# Patient Record
Sex: Male | Born: 1985 | Race: White | Hispanic: Yes | Marital: Married | Smoking: Current some day smoker
Health system: Southern US, Community
[De-identification: ages and names within clinical notes are randomized; demographics above are authoritative.]

## PROBLEM LIST (undated history)

## (undated) DIAGNOSIS — IMO0001 Reserved for inherently not codable concepts without codable children: Secondary | ICD-10-CM

## (undated) DIAGNOSIS — G510 Bell's palsy: Secondary | ICD-10-CM

## (undated) DIAGNOSIS — K219 Gastro-esophageal reflux disease without esophagitis: Secondary | ICD-10-CM

## (undated) DIAGNOSIS — I10 Essential (primary) hypertension: Secondary | ICD-10-CM

---
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
## 2004-05-19 ENCOUNTER — Emergency Department (HOSPITAL_COMMUNITY): Admission: EM | Admit: 2004-05-19 | Discharge: 2004-05-19 | Payer: Self-pay | Admitting: Emergency Medicine

## 2004-11-25 ENCOUNTER — Emergency Department (HOSPITAL_COMMUNITY): Admission: EM | Admit: 2004-11-25 | Discharge: 2004-11-26 | Payer: Self-pay | Admitting: Emergency Medicine

## 2005-09-06 ENCOUNTER — Emergency Department (HOSPITAL_COMMUNITY): Admission: EM | Admit: 2005-09-06 | Discharge: 2005-09-07 | Payer: Self-pay | Admitting: Emergency Medicine

## 2006-09-06 ENCOUNTER — Emergency Department (HOSPITAL_COMMUNITY): Admission: EM | Admit: 2006-09-06 | Discharge: 2006-09-06 | Payer: Self-pay | Admitting: Emergency Medicine

## 2008-02-19 HISTORY — PX: WRIST SURGERY: SHX841

## 2009-07-14 ENCOUNTER — Emergency Department (HOSPITAL_COMMUNITY): Admission: EM | Admit: 2009-07-14 | Discharge: 2009-07-14 | Payer: Self-pay | Admitting: Emergency Medicine

## 2009-08-29 ENCOUNTER — Ambulatory Visit (HOSPITAL_COMMUNITY): Admission: RE | Admit: 2009-08-29 | Discharge: 2009-08-29 | Payer: Self-pay | Admitting: Plastic Surgery

## 2010-06-12 ENCOUNTER — Inpatient Hospital Stay (INDEPENDENT_AMBULATORY_CARE_PROVIDER_SITE_OTHER)
Admission: RE | Admit: 2010-06-12 | Discharge: 2010-06-12 | Disposition: A | Discharge status: Home / Self Care | Payer: Medicaid Other | Source: Ambulatory Visit | Attending: Emergency Medicine | Admitting: Emergency Medicine

## 2010-06-12 DIAGNOSIS — L03039 Cellulitis of unspecified toe: Secondary | ICD-10-CM

## 2010-06-12 DIAGNOSIS — L600 Ingrowing nail: Secondary | ICD-10-CM

## 2010-06-12 DIAGNOSIS — L255 Unspecified contact dermatitis due to plants, except food: Secondary | ICD-10-CM

## 2010-12-03 LAB — I-STAT 8, (EC8 V) (CONVERTED LAB)
Acid-base deficit: 1
BUN: 9
Bicarbonate: 23.8
Chloride: 108
Glucose, Bld: 88
HCT: 48
Hemoglobin: 16.3
Operator id: 161631
Potassium: 3.6
Sodium: 141
TCO2: 25
pCO2, Ven: 39.4 — ABNORMAL LOW
pH, Ven: 7.388 — ABNORMAL HIGH

## 2010-12-03 LAB — POCT I-STAT CREATININE
Creatinine, Ser: 1
Operator id: 161631

## 2011-10-24 ENCOUNTER — Encounter (HOSPITAL_COMMUNITY): Payer: Self-pay | Admitting: Emergency Medicine

## 2011-10-24 ENCOUNTER — Emergency Department (HOSPITAL_COMMUNITY): Payer: BC Managed Care – PPO

## 2011-10-24 DIAGNOSIS — M25469 Effusion, unspecified knee: Secondary | ICD-10-CM | POA: Insufficient documentation

## 2011-10-24 DIAGNOSIS — K219 Gastro-esophageal reflux disease without esophagitis: Secondary | ICD-10-CM | POA: Insufficient documentation

## 2011-10-24 DIAGNOSIS — M25569 Pain in unspecified knee: Secondary | ICD-10-CM | POA: Insufficient documentation

## 2011-10-24 NOTE — ED Notes (Signed)
R knee has been bothering him for 4 days, possible hurt from basketball and working out; CMS intact

## 2011-10-25 ENCOUNTER — Emergency Department (HOSPITAL_COMMUNITY)
Admission: EM | Admit: 2011-10-25 | Discharge: 2011-10-25 | Disposition: A | Discharge status: Home / Self Care | Payer: BC Managed Care – PPO | Attending: Emergency Medicine | Admitting: Emergency Medicine

## 2011-10-25 DIAGNOSIS — M25469 Effusion, unspecified knee: Secondary | ICD-10-CM

## 2011-10-25 HISTORY — DX: Reserved for inherently not codable concepts without codable children: IMO0001

## 2011-10-25 HISTORY — DX: Gastro-esophageal reflux disease without esophagitis: K21.9

## 2011-10-25 MED ORDER — OXYCODONE-ACETAMINOPHEN 5-325 MG PO TABS: 1.0000 | ORAL_TABLET | ORAL | Status: AC | PRN

## 2011-10-25 MED ORDER — IBUPROFEN 400 MG PO TABS
800.0000 mg | ORAL_TABLET | Freq: Once | ORAL | Status: AC
  Administered 2011-10-25: 800 mg via ORAL
  Filled 2011-10-25: qty 2

## 2011-10-25 NOTE — ED Notes (Signed)
Pt reports (R) lateral and anterior knee pain x5 days, pt reports playing basketball Sunday night, pain started shortly after, denies any known injury

## 2011-10-25 NOTE — ED Provider Notes (Signed)
History     CSN: [id number]  Arrival date & time 10/24/11  2132   First MD Initiated Contact with Patient 10/25/11 0107      Chief Complaint  Patient presents with  . Knee Pain    (Consider location/radiation/quality/duration/timing/severity/associated sxs/prior treatment) Patient is a 26 y.o. male presenting with knee pain. The history is provided by the patient. No language interpreter was used.  Knee Pain The current episode started in the past 7 days. The problem occurs constantly. The problem has been gradually worsening. Pertinent negatives include no chills, fever, nausea, numbness, vomiting or weakness. The symptoms are aggravated by walking. He has tried NSAIDs for the symptoms.  R knee pain x 5 days with + effusion.  Recurrent.   Past Medical History  Diagnosis Date  . Reflux     Past Surgical History  Procedure Date  . Wrist surgery   . Finger surgery     History reviewed. No pertinent family history.  History  Substance Use Topics  . Smoking status: Never Smoker   . Smokeless tobacco: Not on file  . Alcohol Use: No      Review of Systems  Constitutional: Negative.  Negative for fever and chills.  HENT: Negative.   Eyes: Negative.   Respiratory: Negative.   Cardiovascular: Negative.   Gastrointestinal: Negative.  Negative for nausea and vomiting.  Musculoskeletal:       R knee  Neurological: Negative.  Negative for weakness and numbness.  Psychiatric/Behavioral: Negative.   All other systems reviewed and are negative.    Allergies  Review of patient's allergies indicates no known allergies.  Home Medications   Current Outpatient Rx  Name Route Sig Dispense Refill  . OMEPRAZOLE 20 MG PO CPDR Oral Take 40 mg by mouth daily.      BP 134/87  Pulse 73  Temp 98.3 F (36.8 C) (Oral)  Resp 22  SpO2 100%  Physical Exam  Nursing note and vitals reviewed. Constitutional: He is oriented to person, place, and time. He appears well-developed and  well-nourished.  HENT:  Head: Normocephalic.  Eyes: Conjunctivae and EOM are normal. Pupils are equal, round, and reactive to light.  Neck: Normal range of motion. Neck supple.  Cardiovascular: Normal rate.   Pulmonary/Chest: Effort normal.  Abdominal: Soft.  Musculoskeletal: Normal range of motion.       R knee tenderness with external in internal rotation and palpatation  Neurological: He is alert and oriented to person, place, and time.  Skin: Skin is warm and dry.  Psychiatric: He has a normal mood and affect.    ED Course  Procedures (including critical care time)  Labs Reviewed - No data to display Dg Knee Complete 4 Views Right  10/24/2011  *RADIOLOGY REPORT*  Clinical Data: Knee pain for 3 days with slight swelling.  No known injury.  RIGHT KNEE - COMPLETE 4+ VIEW  Comparison: None.  Findings: There is spurring of the superior aspect of the medial femoral epicondyle which may be secondary to an old injury.  No acute fracture, dislocation or significant joint space loss is seen.  There is evidence of a moderate sized knee joint effusion on the lateral view.  IMPRESSION: Nonspecific knee joint effusion without acute osseous findings.   Original Report Authenticated By: Gerrianne Scale, M.D.      No diagnosis found.    MDM  R knee pain x 4-5 days with effusion.  Ice elevate ibuprofen, knee sleeve and prescription for Percocet. Patient will  followup with orthopedic if not better in 5-7 days. Return to the ER for severe pain or any other concerns. X-ray was reviewed by myself.         Remi Haggard, NP 10/25/11 [phone]

## 2011-10-25 NOTE — ED Provider Notes (Signed)
Medical screening examination/treatment/procedure(s) were performed by non-physician practitioner and as supervising physician I was immediately available for consultation/collaboration.  Tena Linebaugh, MD 10/25/11 0604 

## 2011-10-25 NOTE — ED Notes (Signed)
Family at bedside. 

## 2012-01-19 ENCOUNTER — Emergency Department (HOSPITAL_COMMUNITY): Payer: BC Managed Care – PPO

## 2012-01-19 ENCOUNTER — Encounter (HOSPITAL_COMMUNITY): Payer: Self-pay | Admitting: *Deleted

## 2012-01-19 ENCOUNTER — Emergency Department (HOSPITAL_COMMUNITY)
Admission: EM | Admit: 2012-01-19 | Discharge: 2012-01-19 | Disposition: A | Discharge status: Home / Self Care | Payer: BC Managed Care – PPO | Attending: Emergency Medicine | Admitting: Emergency Medicine

## 2012-01-19 DIAGNOSIS — K219 Gastro-esophageal reflux disease without esophagitis: Secondary | ICD-10-CM | POA: Insufficient documentation

## 2012-01-19 DIAGNOSIS — S91309A Unspecified open wound, unspecified foot, initial encounter: Secondary | ICD-10-CM | POA: Insufficient documentation

## 2012-01-19 DIAGNOSIS — Y9239 Other specified sports and athletic area as the place of occurrence of the external cause: Secondary | ICD-10-CM | POA: Insufficient documentation

## 2012-01-19 DIAGNOSIS — Z79899 Other long term (current) drug therapy: Secondary | ICD-10-CM | POA: Insufficient documentation

## 2012-01-19 DIAGNOSIS — W268XXA Contact with other sharp object(s), not elsewhere classified, initial encounter: Secondary | ICD-10-CM | POA: Insufficient documentation

## 2012-01-19 DIAGNOSIS — Y9366 Activity, soccer: Secondary | ICD-10-CM | POA: Insufficient documentation

## 2012-01-19 DIAGNOSIS — T148XXA Other injury of unspecified body region, initial encounter: Secondary | ICD-10-CM

## 2012-01-19 MED ORDER — HYDROCODONE-ACETAMINOPHEN 5-325 MG PO TABS: 1.0000 | ORAL_TABLET | Freq: Four times a day (QID) | ORAL | Status: DC | PRN

## 2012-01-19 MED ORDER — CIPROFLOXACIN HCL 750 MG PO TABS: 750.0000 mg | ORAL_TABLET | Freq: Two times a day (BID) | ORAL | Status: DC

## 2012-01-19 MED ORDER — TETANUS-DIPHTH-ACELL PERTUSSIS 5-2.5-18.5 LF-MCG/0.5 IM SUSP: 0.5000 mL | Freq: Once | INTRAMUSCULAR | Status: DC

## 2012-01-19 MED ORDER — OXYCODONE-ACETAMINOPHEN 5-325 MG PO TABS
2.0000 | ORAL_TABLET | Freq: Once | ORAL | Status: AC
  Administered 2012-01-19: 2 via ORAL
  Filled 2012-01-19: qty 2

## 2012-01-19 NOTE — ED Provider Notes (Addendum)
Did not see any evidence of foreign body on ultrasound examination of foot in orthogonal planes. Medical screening examination/treatment/procedure(s) were performed by non-physician practitioner and as supervising physician I was immediately available for consultation/collaboration.  Jones Skene, M.D.     Jones Skene, MD 01/19/12 2341

## 2012-01-19 NOTE — Progress Notes (Signed)
Orthopedic Tech Progress Note Patient Details:  Richard Ramirez 02/02/1986 [id number]  Ortho Devices Type of Ortho Device: Crutches Ortho Device/Splint Interventions: Application;Ordered   Jennye Moccasin 01/19/2012, 8:48 PM

## 2012-01-19 NOTE — ED Provider Notes (Signed)
History     CSN: [id number]  Arrival date & time 01/19/12  1726   First MD Initiated Contact with Patient 01/19/12 1824      Chief Complaint  Patient presents with  . Foot Injury    (Consider location/radiation/quality/duration/timing/severity/associated sxs/prior treatment) HPI Comments: 26 year old male presents emergency department complaining of left foot pain.  Onset of symptoms occurred just prior to arrival after an injury while playing soccer.  Patient states that he was wearing socks when he stepped on a piece of wood that penetrated the plantar surface of his left foot.  He had immediate pain and bleeding.  Patient states that pain is worsened by palpation or weightbearing.  No other complaints at this time.  The history is provided by the patient.    Past Medical History  Diagnosis Date  . Reflux     Past Surgical History  Procedure Date  . Wrist surgery   . Finger surgery     History reviewed. No pertinent family history.  History  Substance Use Topics  . Smoking status: Never Smoker   . Smokeless tobacco: Not on file  . Alcohol Use: No      Review of Systems  Constitutional: Negative for fever, diaphoresis and activity change.  HENT: Negative for congestion and neck pain.   Respiratory: Negative for cough.   Genitourinary: Negative for dysuria.  Musculoskeletal: Negative for myalgias.  Skin: Positive for wound. Negative for color change.  Neurological: Negative for headaches.  All other systems reviewed and are negative.    Allergies  Review of patient's allergies indicates no known allergies.  Home Medications   Current Outpatient Rx  Name  Route  Sig  Dispense  Refill  . OMEPRAZOLE 20 MG PO CPDR   Oral   Take 40 mg by mouth daily.           BP 137/90  Pulse 112  Temp 98.2 F (36.8 C) (Oral)  Resp 20  SpO2 97%  Physical Exam  Nursing note and vitals reviewed. Constitutional: He is oriented to person, place, and time. He  appears well-developed and well-nourished. No distress.  HENT:  Head: Normocephalic and atraumatic.  Eyes: Conjunctivae normal and EOM are normal.  Neck: Normal range of motion.  Pulmonary/Chest: Effort normal.  Musculoskeletal: Normal range of motion.       ttp along plantar surface at heal.   Neurological: He is alert and oriented to person, place, and time.  Skin: Skin is warm and dry. No rash noted. He is not diaphoretic.       1-2 cm flap located on the plantar surface of left foot at the heel. No evidence of palpable foreign body.  Bleeding contained. No swelling warmth or erythema seen   Psychiatric: He has a normal mood and affect. His behavior is normal.    ED Course  Procedures (including critical care time)  Labs Reviewed - No data to display Dg Foot Complete Left  01/19/2012  *RADIOLOGY REPORT*  Clinical Data: Laceration on the left heel.  LEFT FOOT - COMPLETE 3+ VIEW  Comparison: None.  Findings: No acute osseous abnormality.  Slight dorsal spurring of the distal talus.  Irregularity in the soft tissue of the plantar aspect of foot may be related to a bandage.  IMPRESSION: No acute osseous abnormality.  Soft tissue densities are probably related to a bandage.   Original Report Authenticated By: Francene Boyers, M.D.      No diagnosis found.  FAST BEDSIDE US Indication:  Searching for radio lucent FB ie.: wood splinter Study performed with Dr. Rulon Abide at bed side. No evidence of FB, view consisted of 3 cm depth. Pt tolerated exam well.  Archived electronically I personally performed and interrepreted the images   MDM  Puncture wound  Pt presents to ER c/o puncture wound. No evidence of FB on Xray or Bedside US. No evidence of bone penetration, wound to be treated with local care. Advised pt that limb should be non-wt bearing, elevated & pt followed very closely for signs of infection. Strict return precautions were discussed. Pseudomonas coverage abx given (cipro 750 PO BID x  7 days).          Jaci Carrel, [place] 01/19/12 2038

## 2012-01-19 NOTE — ED Notes (Signed)
Pt reports playing soccer and cut bottom of left foot on piece of wood, abrasion noted, no bleeding noted.

## 2012-09-03 ENCOUNTER — Encounter (HOSPITAL_COMMUNITY): Payer: Self-pay | Admitting: *Deleted

## 2012-09-03 ENCOUNTER — Emergency Department (HOSPITAL_COMMUNITY)
Admission: EM | Admit: 2012-09-03 | Discharge: 2012-09-03 | Disposition: A | Discharge status: Home / Self Care | Payer: BC Managed Care – PPO | Source: Home / Self Care | Attending: Family Medicine | Admitting: Family Medicine

## 2012-09-03 DIAGNOSIS — T6391XA Toxic effect of contact with unspecified venomous animal, accidental (unintentional), initial encounter: Secondary | ICD-10-CM

## 2012-09-03 DIAGNOSIS — T63461A Toxic effect of venom of wasps, accidental (unintentional), initial encounter: Secondary | ICD-10-CM

## 2012-09-03 MED ORDER — HYDROCODONE-ACETAMINOPHEN 5-325 MG PO TABS
ORAL_TABLET | ORAL | Status: AC
  Filled 2012-09-03: qty 1

## 2012-09-03 MED ORDER — METHYLPREDNISOLONE 4 MG PO KIT: PACK | ORAL | Status: DC

## 2012-09-03 MED ORDER — TRIAMCINOLONE ACETONIDE 40 MG/ML IJ SUSP
INTRAMUSCULAR | Status: AC
  Filled 2012-09-03: qty 2

## 2012-09-03 MED ORDER — HYDROCODONE-ACETAMINOPHEN 5-325 MG PO TABS
1.0000 | ORAL_TABLET | Freq: Once | ORAL | Status: AC
  Administered 2012-09-03: 1 via ORAL

## 2012-09-03 MED ORDER — DIPHENHYDRAMINE HCL 50 MG/ML IJ SOLN
INTRAMUSCULAR | Status: AC
  Filled 2012-09-03: qty 1

## 2012-09-03 MED ORDER — TRIAMCINOLONE ACETONIDE 40 MG/ML IJ SUSP
60.0000 mg | Freq: Once | INTRAMUSCULAR | Status: AC
  Administered 2012-09-03: 60 mg via INTRAMUSCULAR

## 2012-09-03 MED ORDER — DIPHENHYDRAMINE HCL 50 MG/ML IJ SOLN
50.0000 mg | Freq: Once | INTRAMUSCULAR | Status: AC
  Administered 2012-09-03: 50 mg via INTRAMUSCULAR

## 2012-09-03 NOTE — ED Notes (Signed)
Stung by a wasp on R small finger @ 1600.  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  C/o nausea, headache and  pain up to R lat. Wrist.  C/o some numbness to the finger.  Dorsum of R hand appears swollen.  Wife thinks his speech is still slurred.  Pt. states he feels " a little SOB."  C/o feeling hot.  No acute respiratory distress noted.   Pt. speaking in complete sentences. No rash noted.

## 2012-09-03 NOTE — ED Provider Notes (Signed)
History    CSN: [id number] Arrival date & time 09/03/12  1614  None    Chief Complaint  Patient presents with  . Insect Bite   (Consider location/radiation/quality/duration/timing/severity/associated sxs/prior Treatment) HPI Comments: 27 year old male received a bee sting to the right fifth digit. This occurred approximately 45 minutes before he arrived. There is mild swelling to the fifth digit and the ulnar aspect of the hand. Edema does not extend beyond the wrist. He states pain does extend toward the elbow. He rates the pain at the site of the sting as an 8/10. Is having mild nausea but no vomiting. Denies shortness of breath.  Past Medical History  Diagnosis Date  . Reflux    Past Surgical History  Procedure Laterality Date  . Wrist surgery Left 2010  . Finger surgery Right 2002    R small finger   Family History  Problem Relation Age of Onset  . Hyperlipidemia Mother    History  Substance Use Topics  . Smoking status: Never Smoker   . Smokeless tobacco: Not on file  . Alcohol Use: Yes     Comment: occasional    Review of Systems  Constitutional: Positive for activity change. Negative for fever.  HENT: Negative.   Eyes: Negative.   Respiratory: Negative.   Gastrointestinal: Positive for nausea. Negative for vomiting.  Musculoskeletal: Negative.   Neurological: Negative.     Allergies  Review of patient's allergies indicates no known allergies.  Home Medications   Current Outpatient Rx  Name  Route  Sig  Dispense  Refill  . ibuprofen (ADVIL,MOTRIN) 800 MG tablet   Oral   Take 800 mg by mouth every 8 (eight) hours as needed for pain.         Marland Kitchen omeprazole (PRILOSEC) 20 MG capsule   Oral   Take 40 mg by mouth 2 (two) times daily.          . ciprofloxacin (CIPRO) 750 MG tablet   Oral   Take 1 tablet (750 mg total) by mouth 2 (two) times daily.   14 tablet   0   . HYDROcodone-acetaminophen (NORCO/VICODIN) 5-325 MG per tablet   Oral   Take 1  tablet by mouth every 6 (six) hours as needed for pain.   15 tablet   0   . methylPREDNISolone (MEDROL DOSEPAK) 4 MG tablet      follow package directions. Take with food.   21 tablet   0    BP 126/82  Pulse 110  Temp(Src) 98.2 F (36.8 C) (Oral)  Resp 22  SpO2 98% Physical Exam  Nursing note and vitals reviewed. Constitutional: He is oriented to person, place, and time. He appears well-developed and well-nourished. No distress.  Appears to be in no distress. No respiratory distress or tachypnea. No generalized swelling.  HENT:  Mouth/Throat: Oropharynx is clear and moist. No oropharyngeal exudate.  Eyes: Conjunctivae and EOM are normal.  Neck: Normal range of motion. Neck supple.  Cardiovascular: Normal rate, regular rhythm and normal heart sounds.   Pulmonary/Chest: Effort normal and breath sounds normal. No respiratory distress. He has no wheezes.  Musculoskeletal: Normal range of motion.  Neurological: He is alert and oriented to person, place, and time.  Skin: Skin is warm and dry.  Minor erythema to a small area of the proximal phalanx of the right fifth digit with a bee sting occurred. Mild edema to the fifth digit and ulnar aspect of the right hand.  Psychiatric: He has a  normal mood and affect.    ED Course  Procedures (including critical care time) Labs Reviewed - No data to display No results found. 1. Bee sting reaction, initial encounter     MDM  Pt observed until 1815h He has improved with reduction in pain and has not developed new symptoms. St feel better. D/C in stable and improved condition. Medrol dosepack Benadryl 50mg  q 4h for the next 24h.  For any new problems seek medical attention promptly, especially for orla swelling, trouble breating.  Hayden Rasmussen, NP 09/03/12 [phone]

## 2012-09-05 NOTE — ED Provider Notes (Signed)
Medical screening examination/treatment/procedure(s) were performed by resident physician or non-physician practitioner and as supervising physician I was immediately available for consultation/collaboration.   KINDL,JAMES DOUGLAS MD.   James D Kindl, MD 09/05/12 0931 

## 2012-09-12 ENCOUNTER — Emergency Department (HOSPITAL_COMMUNITY)
Admission: EM | Admit: 2012-09-12 | Discharge: 2012-09-12 | Disposition: A | Discharge status: Home / Self Care | Payer: BC Managed Care – PPO | Source: Home / Self Care

## 2012-09-12 ENCOUNTER — Encounter (HOSPITAL_COMMUNITY): Payer: Self-pay | Admitting: Emergency Medicine

## 2012-09-12 DIAGNOSIS — S41112A Laceration without foreign body of left upper arm, initial encounter: Secondary | ICD-10-CM

## 2012-09-12 DIAGNOSIS — S41109A Unspecified open wound of unspecified upper arm, initial encounter: Secondary | ICD-10-CM

## 2012-09-12 NOTE — ED Provider Notes (Signed)
Richard Ramirez is a 27 y.o. male who presents to Urgent Care today for left upper extremity laceration. Patient was driving a lawnmower today when he was struck by a sharp tree branch in his upper inner arm on the left side. This resulted in a small laceration. He control bleeding with pressure and presents today to clinic for evaluation and management. He denies any significant pain radiating pain weakness numbness fevers or chills. His last tetanus shot was in 2010.    PMH reviewed. Healthy otherwise History  Substance Use Topics  . Smoking status: Never Smoker   . Smokeless tobacco: Not on file  . Alcohol Use: Yes     Comment: occasional   ROS as above Medications reviewed. No current facility-administered medications for this encounter.   No current outpatient prescriptions on file.    Exam:  BP 146/78  Pulse 91  Temp(Src) 98.3 F (36.8 C) (Oral)  Resp 19  SpO2 97% Gen: Well NAD Skin: Left upper inner arm small one and a half centimeter linear laceration involving the dermis not extending through the subcutaneous tissue. No significant bleeding.    Procedure note: Consent obtained and timeout performed. The wound is copiously irrigated with sterile saline. One and a half milliliters of 2% lidocaine with epinephrine were injected around the wound achieving good anesthesia. Sterile dressing was then applied and 3 simple interrupted sutures using 4-0 Prolene were used to close the wound. Patient tolerated procedure well.   No results found for this or any previous visit (from the past 24 hour(s)). No results found.  Assessment and Plan: 27 y.o. male with laceration repaired with sutures,  Tetanus up-to-date.  Followup in one week for suture removal.  Laceration care instructions provided in a handout.  Discussed warning signs or symptoms. Please see discharge instructions. Patient expresses understanding.      Rodolph Bong, MD 09/12/12 [phone]

## 2012-09-12 NOTE — ED Notes (Signed)
Pt c/o laceration to right arm/bicep onset today... Reports he was cutting his yard when a tree branch hit his arm... Last tetanus was about 6 years... He is alert w/no signs of acute distress.

## 2013-02-14 ENCOUNTER — Emergency Department (HOSPITAL_COMMUNITY)
Admission: EM | Admit: 2013-02-14 | Discharge: 2013-02-14 | Disposition: A | Discharge status: Home / Self Care | Payer: BC Managed Care – PPO | Attending: Emergency Medicine | Admitting: Emergency Medicine

## 2013-02-14 ENCOUNTER — Encounter (HOSPITAL_COMMUNITY): Payer: Self-pay | Admitting: Emergency Medicine

## 2013-02-14 ENCOUNTER — Emergency Department (HOSPITAL_COMMUNITY): Payer: BC Managed Care – PPO

## 2013-02-14 DIAGNOSIS — J029 Acute pharyngitis, unspecified: Secondary | ICD-10-CM

## 2013-02-14 DIAGNOSIS — Z8719 Personal history of other diseases of the digestive system: Secondary | ICD-10-CM | POA: Insufficient documentation

## 2013-02-14 DIAGNOSIS — H9209 Otalgia, unspecified ear: Secondary | ICD-10-CM | POA: Insufficient documentation

## 2013-02-14 DIAGNOSIS — M542 Cervicalgia: Secondary | ICD-10-CM | POA: Insufficient documentation

## 2013-02-14 LAB — RAPID STREP SCREEN (MED CTR MEBANE ONLY): Streptococcus, Group A Screen (Direct): NEGATIVE

## 2013-02-14 MED ORDER — HYDROCODONE-HOMATROPINE 5-1.5 MG/5ML PO SYRP
5.0000 mL | ORAL_SOLUTION | Freq: Once | ORAL | Status: AC
  Administered 2013-02-14: 5 mL via ORAL
  Filled 2013-02-14: qty 5

## 2013-02-14 MED ORDER — HYDROCODONE-HOMATROPINE 5-1.5 MG/5ML PO SYRP: 5.0000 mL | ORAL_SOLUTION | Freq: Four times a day (QID) | ORAL | Status: DC | PRN

## 2013-02-14 NOTE — ED Notes (Signed)
Pt c/o sore throat x 1 week with some pain and swelling

## 2013-02-14 NOTE — ED Provider Notes (Signed)
CSN: [id number]     Arrival date & time 02/14/13  1718 History  This chart was scribed for Fayrene Helper, PA-C, working with Junius Argyle, MD by Blanchard Kelch, ED Scribe. This patient was seen in room TR04C/TR04C and the patient's care was started at 7:33 PM.    Chief Complaint  Patient presents with  . Sore Throat    Patient is a 27 y.o. male presenting with pharyngitis. The history is provided by the patient. No language interpreter was used.  Sore Throat Pertinent negatives include no chest pain and no shortness of breath.    HPI Comments: Richard Ramirez is a 27 y.o. male who presents to the Emergency Department complaining of constant, worsening left sided sore throat that began about a week ago. He was using Alka Seltzer Plus for the sore throat with temporary relief. However, he states that yesterday and today the pain worsened. The pain now radiates throughout the left side of his neck and up to his ear. He describes the pain as stabbing and reports it is a 9/10 in severity. The pain is worsened by swallowing and talking. He denies rhinorrhea, sneezing cough, chest pain, shortness of breath. He denies a history of recurrent strep throat infections. He denies any sick contacts.     Past Medical History  Diagnosis Date  . Reflux    Past Surgical History  Procedure Laterality Date  . Wrist surgery Left 2010  . Finger surgery Right 2002    R small finger   Family History  Problem Relation Age of Onset  . Hyperlipidemia Mother    History  Substance Use Topics  . Smoking status: Never Smoker   . Smokeless tobacco: Not on file  . Alcohol Use: Yes     Comment: occasional    Review of Systems  HENT: Positive for ear pain and sore throat. Negative for rhinorrhea and sneezing.   Respiratory: Negative for cough and shortness of breath.   Cardiovascular: Negative for chest pain.  Musculoskeletal: Positive for neck pain.    Allergies  Review of patient's allergies  indicates no known allergies.  Home Medications   Current Outpatient Rx  Name  Route  Sig  Dispense  Refill  . Aspirin Effervescent (ALKA-SELTZER PO)   Oral   Take 2 tablets by mouth daily.          Triage Vitals: BP 143/98  Pulse 114  Temp(Src) 97.9 F (36.6 C) (Oral)  Resp 20  Ht 6\' 3"  (1.905 m)  Wt 348 lb (157.852 kg)  BMI 43.50 kg/m2  SpO2 96%  Physical Exam  Nursing note and vitals reviewed. Constitutional: He is oriented to person, place, and time. He appears well-developed and well-nourished. No distress.  HENT:  Head: Normocephalic and atraumatic.  Right Ear: Tympanic membrane, external ear and ear canal normal.  Left Ear: Tympanic membrane normal.  Bilateral tonsillar enlargement and mild exudates noted. No peritonsillar abscess. No ludwig's angina. Left ear canal mildly erythematous. Left TM normal.   Eyes: EOM are normal.  Neck: Neck supple. No tracheal deviation present.  Cardiovascular: Normal rate, regular rhythm and normal heart sounds.  Exam reveals no gallop and no friction rub.   No murmur heard. Pulmonary/Chest: Effort normal. No respiratory distress. He has no wheezes. He has no rales.  Musculoskeletal: Normal range of motion.  Lymphadenopathy:    He has cervical adenopathy.  Neurological: He is alert and oriented to person, place, and time.  Skin: Skin is warm and dry.  Psychiatric: He has a normal mood and affect. His behavior is normal.    ED Course  Procedures (including critical care time)  DIAGNOSTIC STUDIES: Oxygen Saturation is 96% on room air, adequate by my interpretation.    COORDINATION OF CARE: 7:37 PM -Rapid strep test came back negative. Will order x-ray of neck soft tissue. Clinical suspicion of viral infection. Will order medication for pain. Recommend patient follows up with ENT if symptoms worsen or persist. Soft tissue xray without evidence suggestive of epiglottitis.  Pt able to swallow, has normal voice, no trismus.  Patient  verbalizes understanding and agrees with treatment plan.    Labs Review Labs Reviewed  RAPID STREP SCREEN  CULTURE, GROUP A STREP  RAPID STREP SCREEN   Imaging Review Dg Neck Soft Tissue  02/14/2013   CLINICAL DATA:  Sore throat. Difficulty swallowing. One week history of productive cough.  EXAM: NECK SOFT TISSUES - 1+ VIEW  COMPARISON:  None.  FINDINGS: Normal prevertebral soft tissues. Normal epiglottis. Normal fair angio all and upper tracheal airway. Visualized cervical spine unremarkable.  IMPRESSION: Normal examination.   Electronically Signed   By: Hulan Saas M.D.   On: 02/14/2013 20:43    EKG Interpretation   None       MDM   1. Pharyngitis    BP 143/98  Pulse 114  Temp(Src) 97.9 F (36.6 C) (Oral)  Resp 20  Ht 6\' 3"  (1.905 m)  Wt 348 lb (157.852 kg)  BMI 43.50 kg/m2  SpO2 96%   I personally performed the services described in this documentation, which was scribed in my presence. The recorded information has been reviewed and is accurate.     Fayrene Helper, PA-C 02/14/13 2139

## 2013-02-14 NOTE — ED Notes (Signed)
Pt alert and oriented, with steady gait at time of discharge. Pt given discharge papers and papers explained. All questions answered and pt walked to discharge.  

## 2013-02-15 NOTE — ED Provider Notes (Signed)
Medical screening examination/treatment/procedure(s) were performed by non-physician practitioner and as supervising physician I was immediately available for consultation/collaboration.  EKG Interpretation   None         Doniven Vanpatten S Dayden Viverette, MD 02/15/13 1215 

## 2013-02-17 LAB — CULTURE, GROUP A STREP

## 2013-03-11 ENCOUNTER — Emergency Department (HOSPITAL_COMMUNITY): Payer: BC Managed Care – PPO

## 2013-03-11 ENCOUNTER — Emergency Department (HOSPITAL_COMMUNITY)
Admission: EM | Admit: 2013-03-11 | Discharge: 2013-03-11 | Disposition: A | Discharge status: Home / Self Care | Payer: BC Managed Care – PPO | Attending: Emergency Medicine | Admitting: Emergency Medicine

## 2013-03-11 ENCOUNTER — Encounter (HOSPITAL_COMMUNITY): Payer: Self-pay | Admitting: Emergency Medicine

## 2013-03-11 DIAGNOSIS — Z79899 Other long term (current) drug therapy: Secondary | ICD-10-CM | POA: Insufficient documentation

## 2013-03-11 DIAGNOSIS — R079 Chest pain, unspecified: Secondary | ICD-10-CM | POA: Insufficient documentation

## 2013-03-11 DIAGNOSIS — F17200 Nicotine dependence, unspecified, uncomplicated: Secondary | ICD-10-CM | POA: Insufficient documentation

## 2013-03-11 DIAGNOSIS — R631 Polydipsia: Secondary | ICD-10-CM | POA: Insufficient documentation

## 2013-03-11 DIAGNOSIS — R3589 Other polyuria: Secondary | ICD-10-CM | POA: Insufficient documentation

## 2013-03-11 DIAGNOSIS — K219 Gastro-esophageal reflux disease without esophagitis: Secondary | ICD-10-CM | POA: Insufficient documentation

## 2013-03-11 DIAGNOSIS — IMO0001 Reserved for inherently not codable concepts without codable children: Secondary | ICD-10-CM | POA: Insufficient documentation

## 2013-03-11 DIAGNOSIS — J069 Acute upper respiratory infection, unspecified: Secondary | ICD-10-CM | POA: Insufficient documentation

## 2013-03-11 DIAGNOSIS — R358 Other polyuria: Secondary | ICD-10-CM | POA: Insufficient documentation

## 2013-03-11 LAB — URINALYSIS, ROUTINE W REFLEX MICROSCOPIC
Bilirubin Urine: NEGATIVE
GLUCOSE, UA: NEGATIVE mg/dL
Hgb urine dipstick: NEGATIVE
Ketones, ur: NEGATIVE mg/dL
LEUKOCYTES UA: NEGATIVE
NITRITE: NEGATIVE
PH: 6 (ref 5.0–8.0)
Protein, ur: 100 mg/dL — AB
Specific Gravity, Urine: 1.036 — ABNORMAL HIGH (ref 1.005–1.030)
Urobilinogen, UA: 1 mg/dL (ref 0.0–1.0)

## 2013-03-11 LAB — URINE MICROSCOPIC-ADD ON

## 2013-03-11 LAB — GLUCOSE, CAPILLARY: Glucose-Capillary: 84 mg/dL (ref 70–99)

## 2013-03-11 MED ORDER — SODIUM CHLORIDE 0.9 % IV BOLUS (SEPSIS)
1000.0000 mL | Freq: Once | INTRAVENOUS | Status: AC
  Administered 2013-03-11: 1000 mL via INTRAVENOUS

## 2013-03-11 MED ORDER — HYDROCOD POLST-CHLORPHEN POLST 10-8 MG/5ML PO LQCR
5.0000 mL | Freq: Once | ORAL | Status: AC
  Administered 2013-03-11: 5 mL via ORAL
  Filled 2013-03-11: qty 5

## 2013-03-11 MED ORDER — HYDROCOD POLST-CHLORPHEN POLST 10-8 MG/5ML PO LQCR: 5.0000 mL | Freq: Two times a day (BID) | ORAL | Status: DC | PRN

## 2013-03-11 MED ORDER — LORAZEPAM 2 MG/ML IJ SOLN: 1.0000 mg | INTRAMUSCULAR | Status: DC

## 2013-03-11 NOTE — Discharge Instructions (Signed)
Antibiotic Nonuse ° Your caregiver felt that the infection or problem was not one that would be helped with an antibiotic. °Infections may be caused by viruses or bacteria. Only a caregiver can tell which one of these is the likely cause of an illness. A cold is the most common cause of infection in both adults and children. A cold is a virus. Antibiotic treatment will have no effect on a viral infection. Viruses can lead to many lost days of work caring for sick children and many missed days of school. Children may catch as many as 10 "colds" or "flus" per year during which they can be tearful, cranky, and uncomfortable. The goal of treating a virus is aimed at keeping the ill person comfortable. °Antibiotics are medications used to help the body fight bacterial infections. There are relatively few types of bacteria that cause infections but there are hundreds of viruses. While both viruses and bacteria cause infection they are very different types of germs. A viral infection will typically go away by itself within 7 to 10 days. Bacterial infections may spread or get worse without antibiotic treatment. °Examples of bacterial infections are: °· Sore throats (like strep throat or tonsillitis). °· Infection in the lung (pneumonia). °· Ear and skin infections. °Examples of viral infections are: °· Colds or flus. °· Most coughs and bronchitis. °· Sore throats not caused by Strep. °· Runny noses. °It is often best not to take an antibiotic when a viral infection is the cause of the problem. Antibiotics can kill off the helpful bacteria that we have inside our body and allow harmful bacteria to start growing. Antibiotics can cause side effects such as allergies, nausea, and diarrhea without helping to improve the symptoms of the viral infection. Additionally, repeated uses of antibiotics can cause bacteria inside of our body to become resistant. That resistance can be passed onto harmful bacterial. The next time you have  an infection it may be harder to treat if antibiotics are used when they are not needed. Not treating with antibiotics allows our own immune system to develop and take care of infections more efficiently. Also, antibiotics will work better for us when they are prescribed for bacterial infections. °Treatments for a child that is ill may include: °· Give extra fluids throughout the day to stay hydrated. °· Get plenty of rest. °· Only give your child over-the-counter or prescription medicines for pain, discomfort, or fever as directed by your caregiver. °· The use of a cool mist humidifier may help stuffy noses. °· Cold medications if suggested by your caregiver. °Your caregiver may decide to start you on an antibiotic if: °· The problem you were seen for today continues for a longer length of time than expected. °· You develop a secondary bacterial infection. °SEEK MEDICAL CARE IF: °· Fever lasts longer than 5 days. °· Symptoms continue to get worse after 5 to 7 days or become severe. °· Difficulty in breathing develops. °· Signs of dehydration develop (poor drinking, rare urinating, dark colored urine). °· Changes in behavior or worsening tiredness (listlessness or lethargy). °Document Released: 04/15/2001 Document Revised: 04/29/2011 Document Reviewed: 10/12/2008 °ExitCare® Patient Information ©2014 [employer]. °Upper Respiratory Infection, Adult °An upper respiratory infection (URI) is also known as the common cold. It is often caused by a type of germ (virus). Colds are easily spread (contagious). You can pass it to others by kissing, coughing, sneezing, or drinking out of the same glass. Usually, you get better in 1 or 2   weeks.  °HOME CARE  °· Only take medicine as told by your doctor. °· Use a warm mist humidifier or breathe in steam from a hot shower. °· Drink enough water and fluids to keep your pee (urine) clear or pale yellow. °· Get plenty of rest. °· Return to work when your temperature is back to  normal or as told by your doctor. You may use a face mask and wash your hands to stop your cold from spreading. °GET HELP RIGHT AWAY IF:  °· After the first few days, you feel you are getting worse. °· You have questions about your medicine. °· You have chills, shortness of breath, or brown or red spit (mucus). °· You have yellow or brown snot (nasal discharge) or pain in the face, especially when you bend forward. °· You have a fever, puffy (swollen) neck, pain when you swallow, or white spots in the back of your throat. °· You have a bad headache, ear pain, sinus pain, or chest pain. °· You have a high-pitched whistling sound when you breathe in and out (wheezing). °· You have a lasting cough or cough up blood. °· You have sore muscles or a stiff neck. °MAKE SURE YOU:  °· Understand these instructions. °· Will watch your condition. °· Will get help right away if you are not doing well or get worse. °Document Released: 07/24/2007 Document Revised: 04/29/2011 Document Reviewed: 06/11/2010 °ExitCare® Patient Information ©2014 [employer]. ° °

## 2013-03-11 NOTE — ED Notes (Signed)
Pt reports flu like symptoms that started yesterday. Having headache, body aches, fever, back pain, productive cough. Mask on pt at triage.

## 2013-03-11 NOTE — ED Provider Notes (Signed)
Medical screening examination/treatment/procedure(s) were performed by non-physician practitioner and as supervising physician I was immediately available for consultation/collaboration.  Bianka Liberati L Klint Lezcano, MD 03/11/13 2013 

## 2013-03-11 NOTE — ED Provider Notes (Signed)
CSN: [id number]     Arrival date & time 03/11/13  4098 History   None    Chief Complaint  Patient presents with  . Influenza   (Consider location/radiation/quality/duration/timing/severity/associated sxs/prior Treatment) Patient is a 28 y.o. male presenting with flu symptoms. The history is provided by the patient. No language interpreter was used.  Influenza Presenting symptoms: cough, fever, myalgias, rhinorrhea and sore throat   Presenting symptoms: no nausea, no shortness of breath and no vomiting   Associated symptoms: no chills   Associated symptoms comment:  Symptoms of sore throat, congestion, body aches, and forceful cough for the last 2-3 days. Low grade fever. He had pharyngitis one month ago but reports that symptoms improved, then recurred recently. Reports family members are sick as well. He has a normal appetite. He also reports bilateral flank pain, polyuria and polydipsia coming on over the last 2-3 weeks..   Past Medical History  Diagnosis Date  . Reflux    Past Surgical History  Procedure Laterality Date  . Wrist surgery Left 2010  . Finger surgery Right 2002    R small finger   Family History  Problem Relation Age of Onset  . Hyperlipidemia Mother    History  Substance Use Topics  . Smoking status: Current Every Day Smoker    Types: Cigarettes  . Smokeless tobacco: Not on file  . Alcohol Use: Yes     Comment: occasional    Review of Systems  Constitutional: Positive for fever. Negative for chills.  HENT: Positive for rhinorrhea and sore throat. Negative for trouble swallowing.   Respiratory: Positive for cough. Negative for shortness of breath.   Cardiovascular: Positive for chest pain.       Chest pain with cough.  Gastrointestinal: Negative.  Negative for nausea and vomiting.  Endocrine: Positive for polydipsia and polyuria.  Musculoskeletal: Positive for myalgias.  Skin: Negative.   Neurological: Negative.     Allergies  Review of patient's  allergies indicates no known allergies.  Home Medications   Current Outpatient Rx  Name  Route  Sig  Dispense  Refill  . omeprazole (PRILOSEC) 40 MG capsule   Oral   Take 40 mg by mouth daily.          BP 139/81  Pulse 104  Temp(Src) 98.6 F (37 C) (Oral)  Resp 18  Wt 351 lb (159.213 kg)  SpO2 98% Physical Exam  Constitutional: He appears well-developed and well-nourished.  HENT:  Head: Normocephalic.  Neck: Normal range of motion. Neck supple.  Cardiovascular: Normal rate and regular rhythm.   Pulmonary/Chest: Effort normal and breath sounds normal.  Abdominal: Soft. Bowel sounds are normal. There is no tenderness. There is no rebound and no guarding.  Musculoskeletal: Normal range of motion.  Neurological: He is alert. No cranial nerve deficit.  Skin: Skin is warm and dry. No rash noted.  Psychiatric: He has a normal mood and affect.    ED Course  Procedures (including critical care time) Labs Review Labs Reviewed - No data to display Imaging Review No results found. Dg Neck Soft Tissue  02/14/2013   CLINICAL DATA:  Sore throat. Difficulty swallowing. One week history of productive cough.  EXAM: NECK SOFT TISSUES - 1+ VIEW  COMPARISON:  None.  FINDINGS: Normal prevertebral soft tissues. Normal epiglottis. Normal fair angio all and upper tracheal airway. Visualized cervical spine unremarkable.  IMPRESSION: Normal examination.   Electronically Signed   By: Hulan Saas M.D.   On: 02/14/2013 20:43  Dg Chest 2 View  03/11/2013   CLINICAL DATA:  Cough and fever  EXAM: CHEST  2 VIEW  COMPARISON:  CT chest 09/06/2006  FINDINGS: The heart size and mediastinal contours are within normal limits. Both lungs are clear. The visualized skeletal structures are unremarkable.  IMPRESSION: No active cardiopulmonary disease.   Electronically Signed   By: Marlan Palauharles  Clark M.D.   On: 03/11/2013 12:55  Results for orders placed during the hospital encounter of 03/11/13  GLUCOSE,  CAPILLARY      Result Value Range   Glucose-Capillary 84  70 - 99 mg/dL  URINALYSIS, ROUTINE W REFLEX MICROSCOPIC      Result Value Range   Color, Urine YELLOW  YELLOW   APPearance CLEAR  CLEAR   Specific Gravity, Urine 1.036 (*) 1.005 - 1.030   pH 6.0  5.0 - 8.0   Glucose, UA NEGATIVE  NEGATIVE mg/dL   Hgb urine dipstick NEGATIVE  NEGATIVE   Bilirubin Urine NEGATIVE  NEGATIVE   Ketones, ur NEGATIVE  NEGATIVE mg/dL   Protein, ur 829100 (*) NEGATIVE mg/dL   Urobilinogen, UA 1.0  0.0 - 1.0 mg/dL   Nitrite NEGATIVE  NEGATIVE   Leukocytes, UA NEGATIVE  NEGATIVE  URINE MICROSCOPIC-ADD ON      Result Value Range   Squamous Epithelial / LPF RARE  RARE   WBC, UA 0-2  <3 WBC/hpf   Urine-Other MUCOUS PRESENT      EKG Interpretation   None       MDM  No diagnosis found. 1. URI  Supportive care. Given IV fluids here and feels minimally better. VSS. Stable for discharge.     Arnoldo HookerShari A Azie Mcconahy, PA-C 03/11/13 1533

## 2013-03-11 NOTE — ED Notes (Signed)
CBG 84. 

## 2013-06-18 ENCOUNTER — Encounter (HOSPITAL_COMMUNITY): Payer: Self-pay | Admitting: Emergency Medicine

## 2013-06-18 ENCOUNTER — Emergency Department (HOSPITAL_COMMUNITY): Payer: BC Managed Care – PPO

## 2013-06-18 ENCOUNTER — Emergency Department (HOSPITAL_COMMUNITY)
Admission: EM | Admit: 2013-06-18 | Discharge: 2013-06-18 | Disposition: A | Discharge status: Home / Self Care | Payer: BC Managed Care – PPO | Attending: Emergency Medicine | Admitting: Emergency Medicine

## 2013-06-18 DIAGNOSIS — Z9889 Other specified postprocedural states: Secondary | ICD-10-CM | POA: Insufficient documentation

## 2013-06-18 DIAGNOSIS — M25532 Pain in left wrist: Secondary | ICD-10-CM

## 2013-06-18 DIAGNOSIS — Z79899 Other long term (current) drug therapy: Secondary | ICD-10-CM | POA: Insufficient documentation

## 2013-06-18 DIAGNOSIS — M25539 Pain in unspecified wrist: Secondary | ICD-10-CM | POA: Insufficient documentation

## 2013-06-18 DIAGNOSIS — F17200 Nicotine dependence, unspecified, uncomplicated: Secondary | ICD-10-CM | POA: Insufficient documentation

## 2013-06-18 DIAGNOSIS — K219 Gastro-esophageal reflux disease without esophagitis: Secondary | ICD-10-CM | POA: Insufficient documentation

## 2013-06-18 DIAGNOSIS — Z8781 Personal history of (healed) traumatic fracture: Secondary | ICD-10-CM | POA: Insufficient documentation

## 2013-06-18 NOTE — ED Provider Notes (Signed)
CSN: [id number]     Arrival date & time 06/18/13  1847 History  This chart was scribed for non-physician practitioner Johnnette Gourdobyn Albert, working with Toy BakerAnthony T Allen, MD by Carl Bestelina Holson, ED Scribe. This patient was seen in room WTR5/WTR5 and the patient's care was started at 7:12 PM.    Chief Complaint  Patient presents with  . Wrist Pain   The history is provided by the patient. No language interpreter was used.   HPI Comments: Richard Ramirez is a 28 y.o. male who presents to the Emergency Department complaining of constant left wrist pain radiating to his left forearm that started this morning when he woke up.  He states that turing his wrist aggravates the pain.  He denies any recent injury to his left wrist. He denies swelling in his wrist and fever as associated symptoms.  The patient lists mild ecchymosis to his left wrist as an associated symptom.   He states that he has taken Ibuprofen with no relief to his symptoms.  The patient states that he had surgery on his left forearm after he re-fractured it in 2010 [facility]    Past Medical History  Diagnosis Date  . Reflux    Past Surgical History  Procedure Laterality Date  . Wrist surgery Left 2010  . Finger surgery Right 2002    R small finger   Family History  Problem Relation Age of Onset  . Hyperlipidemia Mother    History  Substance Use Topics  . Smoking status: Current Every Day Smoker    Types: Cigarettes  . Smokeless tobacco: Not on file  . Alcohol Use: Yes     Comment: occasional    Review of Systems  Constitutional: Negative for fever.  Musculoskeletal: Positive for arthralgias. Negative for joint swelling.  Skin: Negative for color change.  All other systems reviewed and are negative.     Allergies  Review of patient's allergies indicates no known allergies.  Home Medications   Prior to Admission medications   Medication Sig Start Date End Date Taking? Authorizing Provider   chlorpheniramine-HYDROcodone (TUSSIONEX) 10-8 MG/5ML LQCR Take 5 mLs by mouth every 12 (twelve) hours as needed for cough. 03/11/13   Shari A Upstill, PA-C  omeprazole (PRILOSEC) 40 MG capsule Take 40 mg by mouth daily.    Historical Provider, MD   Triage Vitals: BP 129/74  Pulse 74  Temp(Src) 98.2 F (36.8 C) (Oral)  Resp 16  SpO2 97%  Physical Exam  Nursing note and vitals reviewed. Constitutional: He is oriented to person, place, and time. He appears well-developed and well-nourished. No distress.  HENT:  Head: Normocephalic and atraumatic.  Eyes: Conjunctivae and EOM are normal.  Neck: Normal range of motion. Neck supple.  Cardiovascular: Normal rate, regular rhythm and normal heart sounds.   Pulmonary/Chest: Effort normal and breath sounds normal.  Musculoskeletal: Normal range of motion. He exhibits no edema.       Left elbow: He exhibits normal range of motion and no swelling. No tenderness found.       Left wrist: He exhibits tenderness (ulnar aspect). He exhibits normal range of motion and no swelling.  No ecchymosis.   Neurological: He is alert and oriented to person, place, and time.  Skin: Skin is warm and dry.  10 cm surgical scar on ulnar aspect of left wrist.    Psychiatric: He has a normal mood and affect. His behavior is normal.    ED Course  Procedures (  including critical care time)  DIAGNOSTIC STUDIES: Oxygen Saturation is 97% on room air, adequate by my interpretation.    COORDINATION OF CARE: 7:17 PM- Discussed obtaining an x-ray of the patient's left wrist.  The patient agreed to the treatment plan.    Labs Review Labs Reviewed - No data to display  Imaging Review Dg Forearm Left  06/18/2013   CLINICAL DATA:  Left forearm pain.  EXAM: LEFT FOREARM - 2 VIEW  COMPARISON:  07/14/2009  FINDINGS: There is no evidence of acute fracture or other focal bone lesions. Soft tissues are unremarkable.  Plate and screw fixation of distal ulna is noted.  IMPRESSION:  No acute abnormalities.   Electronically Signed   By: Laveda AbbeJeff  Hu M.D.   On: 06/18/2013 19:38   Dg Wrist Complete Left  06/18/2013   CLINICAL DATA:  Left wrist pain.  History of prior forearm surgery.  EXAM: LEFT WRIST - COMPLETE 3+ VIEW  COMPARISON:  07/14/2009  FINDINGS: There is no evidence of acute fracture, subluxation or dislocation.  The joint spaces unremarkable.  Plate and screw fixation of the distal ulna again noted.  IMPRESSION: No acute bony abnormalities.   Electronically Signed   By: Laveda AbbeJeff  Hu M.D.   On: 06/18/2013 19:38     EKG Interpretation None      MDM   Final diagnoses:  Wrist pain, left    Neurovascularly intact. X-ray without any acute findings. Afebrile. No signs of infection. Advised followup with orthopedics, NSAIDs, ice. Stable for discharge. Return precautions given. Patient states understanding of treatment care plan and is agreeable.   I personally performed the services described in this documentation, which was scribed in my presence. The recorded information has been reviewed and is accurate.    [name], PA-C 06/18/13 1952

## 2013-06-18 NOTE — ED Notes (Signed)
Pt states he woke up with L wrist/forearm pain and swelling this am. No injury. Pt had surgery on wrist in 2008 and has metal rods in place. Pt reports some tingling on posterior hand, but none on fingers.

## 2013-06-18 NOTE — ED Provider Notes (Signed)
Medical screening examination/treatment/procedure(s) were performed by non-physician practitioner and as supervising physician I was immediately available for consultation/collaboration.  Righteous Claiborne T Deijah Spikes, MD 06/18/13 2311 

## 2013-06-18 NOTE — Discharge Instructions (Signed)
Ice and elevate your wrist.  Wrist Pain Wrist injuries are frequent in adults and children. A sprain is an injury to the ligaments that hold your bones together. A strain is an injury to muscle or muscle cord-like structures (tendons) from stretching or pulling. Generally, when wrists are moderately tender to touch following a fall or injury, a break in the bone (fracture) may be present. Most wrist sprains or strains are better in 3 to 5 days, but complete healing may take several weeks. HOME CARE INSTRUCTIONS   Put ice on the injured area.  Put ice in a plastic bag.  Place a towel between your skin and the bag.  Leave the ice on for 15-20 minutes, 03-04 times a day, for the first 2 days.  Keep your arm raised above the level of your heart whenever possible to reduce swelling and pain.  Rest the injured area for at least 48 hours or as directed by your caregiver.  If a splint or elastic bandage has been applied, use it for as long as directed by your caregiver or until seen by a caregiver for a follow-up exam.  Only take over-the-counter or prescription medicines for pain, discomfort, or fever as directed by your caregiver.  Keep all follow-up appointments. You may need to follow up with a specialist or have follow-up X-rays. Improvement in pain level is not a guarantee that you did not fracture a bone in your wrist. The only way to determine whether or not you have a broken bone is by X-ray. SEEK IMMEDIATE MEDICAL CARE IF:   Your fingers are swollen, very red, white, or cold and blue.  Your fingers are numb or tingling.  You have increasing pain.  You have difficulty moving your fingers. MAKE SURE YOU:   Understand these instructions.  Will watch your condition.  Will get help right away if you are not doing well or get worse. Document Released: 11/14/2004 Document Revised: 04/29/2011 Document Reviewed: 03/28/2010 [facility] Patient Information 2014 [employer] [place].

## 2013-11-21 ENCOUNTER — Encounter (HOSPITAL_COMMUNITY): Payer: Self-pay | Admitting: Emergency Medicine

## 2013-11-21 ENCOUNTER — Emergency Department (HOSPITAL_COMMUNITY): Payer: BC Managed Care – PPO

## 2013-11-21 ENCOUNTER — Emergency Department (HOSPITAL_COMMUNITY)
Admission: EM | Admit: 2013-11-21 | Discharge: 2013-11-22 | Disposition: A | Discharge status: Home / Self Care | Payer: BC Managed Care – PPO | Attending: Emergency Medicine | Admitting: Emergency Medicine

## 2013-11-21 DIAGNOSIS — Z791 Long term (current) use of non-steroidal anti-inflammatories (NSAID): Secondary | ICD-10-CM | POA: Diagnosis not present

## 2013-11-21 DIAGNOSIS — S60211A Contusion of right wrist, initial encounter: Secondary | ICD-10-CM | POA: Diagnosis not present

## 2013-11-21 DIAGNOSIS — Y9289 Other specified places as the place of occurrence of the external cause: Secondary | ICD-10-CM | POA: Insufficient documentation

## 2013-11-21 DIAGNOSIS — K219 Gastro-esophageal reflux disease without esophagitis: Secondary | ICD-10-CM | POA: Diagnosis not present

## 2013-11-21 DIAGNOSIS — Y9389 Activity, other specified: Secondary | ICD-10-CM | POA: Insufficient documentation

## 2013-11-21 DIAGNOSIS — Z23 Encounter for immunization: Secondary | ICD-10-CM | POA: Diagnosis not present

## 2013-11-21 DIAGNOSIS — W2209XA Striking against other stationary object, initial encounter: Secondary | ICD-10-CM | POA: Diagnosis not present

## 2013-11-21 DIAGNOSIS — S60511A Abrasion of right hand, initial encounter: Secondary | ICD-10-CM | POA: Insufficient documentation

## 2013-11-21 DIAGNOSIS — Z9889 Other specified postprocedural states: Secondary | ICD-10-CM | POA: Diagnosis not present

## 2013-11-21 DIAGNOSIS — Z79899 Other long term (current) drug therapy: Secondary | ICD-10-CM | POA: Diagnosis not present

## 2013-11-21 DIAGNOSIS — S6991XA Unspecified injury of right wrist, hand and finger(s), initial encounter: Secondary | ICD-10-CM | POA: Diagnosis present

## 2013-11-21 DIAGNOSIS — Z720 Tobacco use: Secondary | ICD-10-CM | POA: Diagnosis not present

## 2013-11-21 DIAGNOSIS — S60221A Contusion of right hand, initial encounter: Secondary | ICD-10-CM

## 2013-11-21 MED ORDER — IBUPROFEN 400 MG PO TABS
400.0000 mg | ORAL_TABLET | Freq: Once | ORAL | Status: AC
  Administered 2013-11-21: 400 mg via ORAL
  Filled 2013-11-21: qty 1
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
## 2013-11-22 MED ORDER — TETANUS-DIPHTH-ACELL PERTUSSIS 5-2.5-18.5 LF-MCG/0.5 IM SUSP
0.5000 mL | Freq: Once | INTRAMUSCULAR | Status: AC
  Administered 2013-11-22: 0.5 mL via INTRAMUSCULAR
  Filled 2013-11-22: qty 0.5

## 2013-11-22 MED ORDER — NAPROXEN 500 MG PO TABS: 500.0000 mg | ORAL_TABLET | Freq: Two times a day (BID) | ORAL | Status: DC

## 2013-11-22 NOTE — Discharge Instructions (Signed)
Contusion °A contusion is a deep bruise. Contusions are the result of an injury that caused bleeding under the skin. The contusion may turn blue, purple, or yellow. Minor injuries will give you a painless contusion, but more severe contusions may stay painful and swollen for a few weeks.  °CAUSES  °A contusion is usually caused by a blow, trauma, or direct force to an area of the body. °SYMPTOMS  °· Swelling and redness of the injured area. °· Bruising of the injured area. °· Tenderness and soreness of the injured area. °· Pain. °DIAGNOSIS  °The diagnosis can be made by taking a history and physical exam. An X-ray, CT scan, or MRI may be needed to determine if there were any associated injuries, such as fractures. °TREATMENT  °Specific treatment will depend on what area of the body was injured. In general, the best treatment for a contusion is resting, icing, elevating, and applying cold compresses to the injured area. Over-the-counter medicines may also be recommended for pain control. Ask your caregiver what the best treatment is for your contusion. °HOME CARE INSTRUCTIONS  °· Put ice on the injured area. °¨ Put ice in a plastic bag. °¨ Place a towel between your skin and the bag. °¨ Leave the ice on for 15-20 minutes, 3-4 times a day, or as directed by your health care provider. °· Only take over-the-counter or prescription medicines for pain, discomfort, or fever as directed by your caregiver. Your caregiver may recommend avoiding anti-inflammatory medicines (aspirin, ibuprofen, and naproxen) for 48 hours because these medicines may increase bruising. °· Rest the injured area. °· If possible, elevate the injured area to reduce swelling. °SEEK IMMEDIATE MEDICAL CARE IF:  °· You have increased bruising or swelling. °· You have pain that is getting worse. °· Your swelling or pain is not relieved with medicines. °MAKE SURE YOU:  °· Understand these instructions. °· Will watch your condition. °· Will get help right  away if you are not doing well or get worse. °Document Released: 11/14/2004 Document Revised: 02/09/2013 Document Reviewed: 12/10/2010 °[employer]® Patient Information ©2015 [employer]. This information is not intended to replace advice given to you by your health care provider. Make sure you discuss any questions you have with your health care provider. ° °

## 2013-11-22 NOTE — ED Notes (Signed)
Wrist splint applied and pt instructed on proper use.

## 2013-12-06 ENCOUNTER — Encounter (HOSPITAL_COMMUNITY): Payer: Self-pay | Admitting: Emergency Medicine

## 2013-12-06 ENCOUNTER — Emergency Department (HOSPITAL_COMMUNITY)
Admission: EM | Admit: 2013-12-06 | Discharge: 2013-12-06 | Disposition: A | Discharge status: Home / Self Care | Payer: BC Managed Care – PPO | Attending: Emergency Medicine | Admitting: Emergency Medicine

## 2013-12-06 DIAGNOSIS — L309 Dermatitis, unspecified: Secondary | ICD-10-CM | POA: Diagnosis not present

## 2013-12-06 DIAGNOSIS — Z79899 Other long term (current) drug therapy: Secondary | ICD-10-CM | POA: Diagnosis not present

## 2013-12-06 DIAGNOSIS — R21 Rash and other nonspecific skin eruption: Secondary | ICD-10-CM | POA: Diagnosis present

## 2013-12-06 DIAGNOSIS — K219 Gastro-esophageal reflux disease without esophagitis: Secondary | ICD-10-CM | POA: Diagnosis not present

## 2013-12-06 DIAGNOSIS — Z7952 Long term (current) use of systemic steroids: Secondary | ICD-10-CM | POA: Insufficient documentation

## 2013-12-06 DIAGNOSIS — Z720 Tobacco use: Secondary | ICD-10-CM | POA: Insufficient documentation

## 2013-12-06 MED ORDER — DIPHENHYDRAMINE HCL 25 MG PO TABS
25.0000 mg | ORAL_TABLET | Freq: Four times a day (QID) | ORAL | Status: DC | PRN
Start: 2013-12-06 — End: 2014-11-17

## 2013-12-06 MED ORDER — PREDNISONE 20 MG PO TABS: 40.0000 mg | ORAL_TABLET | Freq: Every day | ORAL | Status: DC

## 2013-12-06 NOTE — ED Notes (Signed)
Pt states that he has developed a rash over the last few days; pt reports his son has had a rash for 2 months; pt states that the rash is "itchy"; pt with raised flesh colored bumps to arms; pt states that the rash is "everywhere"

## 2013-12-06 NOTE — Discharge Instructions (Signed)
Recommend prednisone as prescribed and benadryl as needed for itching. Apply Vaseline twice a day. Follow up with your primary care doctor.  Eczema Eczema, also called atopic dermatitis, is a skin disorder that causes inflammation of the skin. It causes a red rash and dry, scaly skin. The skin becomes very itchy. Eczema is generally worse during the cooler winter months and often improves with the warmth of summer. Eczema usually starts showing signs in infancy. Some children outgrow eczema, but it may last through adulthood.  CAUSES  The exact cause of eczema is not known, but it appears to run in families. People with eczema often have a family history of eczema, allergies, asthma, or hay fever. Eczema is not contagious. Flare-ups of the condition may be caused by:   Contact with something you are sensitive or allergic to.   Stress. SIGNS AND SYMPTOMS  Dry, scaly skin.   Red, itchy rash.   Itchiness. This may occur before the skin rash and may be very intense.  DIAGNOSIS  The diagnosis of eczema is usually made based on symptoms and medical history. TREATMENT  Eczema cannot be cured, but symptoms usually can be controlled with treatment and other strategies. A treatment plan might include:  Controlling the itching and scratching.   Use over-the-counter antihistamines as directed for itching. This is especially useful at night when the itching tends to be worse.   Use over-the-counter steroid creams as directed for itching.   Avoid scratching. Scratching makes the rash and itching worse. It may also result in a skin infection (impetigo) due to a break in the skin caused by scratching.   Keeping the skin well moisturized with creams every day. This will seal in moisture and help prevent dryness. Lotions that contain alcohol and water should be avoided because they can dry the skin.   Limiting exposure to things that you are sensitive or allergic to (allergens).    Recognizing situations that cause stress.   Developing a plan to manage stress.  HOME CARE INSTRUCTIONS   Only take over-the-counter or prescription medicines as directed by your health care provider.   Do not use anything on the skin without checking with your health care provider.   Keep baths or showers short (5 minutes) in warm (not hot) water. Use mild cleansers for bathing. These should be unscented. You may add nonperfumed bath oil to the bath water. It is best to avoid soap and bubble bath.   Immediately after a bath or shower, when the skin is still damp, apply a moisturizing ointment to the entire body. This ointment should be a petroleum ointment. This will seal in moisture and help prevent dryness. The thicker the ointment, the better. These should be unscented.   Keep fingernails cut short. Children with eczema may need to wear soft gloves or mittens at night after applying an ointment.   Dress in clothes made of cotton or cotton blends. Dress lightly, because heat increases itching.   A child with eczema should stay away from anyone with fever blisters or cold sores. The virus that causes fever blisters (herpes simplex) can cause a serious skin infection in children with eczema. SEEK MEDICAL CARE IF:   Your itching interferes with sleep.   Your rash gets worse or is not better within 1 week after starting treatment.   You see pus or soft yellow scabs in the rash area.   You have a fever.   You have a rash flare-up after contact with  someone who has fever blisters.  Document Released: 02/02/2000 Document Revised: 11/25/2012 Document Reviewed: 09/07/2012 [facility] Inc[facility] Patient Information 2015 [place], [place]. This information is not intended to replace advice given to you by your health care provider. Make sure you discuss any questions you have with your health care provider.

## 2013-12-06 NOTE — ED Provider Notes (Signed)
CSN: [id number]     Arrival date & time 12/06/13  1941 History  This chart was scribed for a non-physician practitioner, Antonietta Breach, PA-C, working with Leota Jacobsen, MD by Cathie Hoops, ED Scribe. The patient was seen in [facility]. The patient's care was started at 9:03 PM.    Chief Complaint  Patient presents with  . Rash    The history is provided by the patient. No language interpreter was used.   HPI Comments: Richard Ramirez is a 28 y.o. male who presents to the Emergency Department complaining of acute, moderate, gradually worsening rash onset 10 days ago. The rash is located on his back, arms, torso, and buttocks. He states his rash is itchy. He has associated redness.He states he has used new laundry detergent that they started using two days ago. He denies using benadryl to relieve his symptoms. He has two children with rashes as well. No new mattresses. Pt denies SOB, lip swelling, tongue swelling or fevers.   Past Medical History  Diagnosis Date  . Reflux    Past Surgical History  Procedure Laterality Date  . Wrist surgery Left 2010  . Finger surgery Right 2002    R small finger   Family History  Problem Relation Age of Onset  . Hyperlipidemia Mother    History  Substance Use Topics  . Smoking status: Current Every Day Smoker    Types: Cigarettes  . Smokeless tobacco: Not on file  . Alcohol Use: Yes     Comment: occasional    Review of Systems  Constitutional: Negative for fever.  HENT: Negative for facial swelling.   Respiratory: Negative for shortness of breath.   Skin: Positive for color change and rash.  All other systems reviewed and are negative.   Allergies  Review of patient's allergies indicates no known allergies.  Home Medications   Prior to Admission medications   Medication Sig Start Date End Date Taking? Authorizing Provider  diphenhydrAMINE (BENADRYL) 25 MG tablet Take 1 tablet (25 mg total) by mouth every 6 (six) hours as needed for  itching (Rash). 12/06/13   Antonietta Breach, PA-C  naproxen (NAPROSYN) 500 MG tablet Take 1 tablet (500 mg total) by mouth 2 (two) times daily. 11/22/13   Dorie Rank, MD  omeprazole (PRILOSEC) 40 MG capsule Take 40 mg by mouth daily.    Historical Provider, MD  predniSONE (DELTASONE) 20 MG tablet Take 2 tablets (40 mg total) by mouth daily. Take 40 mg by mouth daily for 3 days, then 20mg  by mouth daily for 3 days, then 10mg  daily for 3 days 12/06/13   Antonietta Breach, PA-C   Triage Vitals: BP 125/79  Pulse 84  Temp(Src) 98.7 F (37.1 C) (Oral)  Resp 18  SpO2 98%  Physical Exam  Nursing note and vitals reviewed. Constitutional: He is oriented to person, place, and time. He appears well-developed and well-nourished. No distress.  Nontoxic/nonseptic appearing  HENT:  Head: Normocephalic and atraumatic.  Mouth/Throat: Oropharynx is clear and moist. No oropharyngeal exudate.  Oropharynx clear. No oral lesions or angioedema. Patient tolerating secretions without difficulty.  Eyes: Conjunctivae and EOM are normal. No scleral icterus.  Neck: Normal range of motion.  Cardiovascular: Normal rate, regular rhythm and intact distal pulses.   Pulmonary/Chest: Effort normal. No respiratory distress. He has no wheezes.  Musculoskeletal: Normal range of motion.  Neurological: He is alert and oriented to person, place, and time. He exhibits normal muscle tone. Coordination normal.  GCS 15. Patient moving all  extremities.  Skin: Skin is warm and dry. Rash noted. He is not diaphoretic. No erythema. No pallor.  Patient with mildly erythematous, dry, maculopapular and punctate papular rash scattered on trunk, abdomen, back, and limbs. Scattered areas of excoriation without evidence of secondary infection. Negative Nikosky's sign. No heat to touch or red streaking.  Psychiatric: He has a normal mood and affect. His behavior is normal.    ED Course  Procedures (including critical care time) DIAGNOSTIC  STUDIES: Oxygen Saturation is 98% on RA, normal by my interpretation.    COORDINATION OF CARE: 9:17 PM- Patient informed of current plan for treatment and evaluation and agrees with plan at this time.  Labs Review Labs Reviewed - No data to display  Imaging Review No results found.   EKG Interpretation None      MDM   Final diagnoses:  Eczema    28 year-old male presents to the emergency department for further evaluation of rash x10 days. Son with similar rash x 2 months which initially had improvement with course of steroids, but rash recurred after steroids were completed. Patient's rash does not extend into oral mucosa. No angioedema. Rash findings today are consistent with eczema. Rash not concerning for SJS, erythema multiforme major, or erythema multiforme minor. Rash does not appear consistent with scabies and this would not explain initial resolution of rash with steroids in patient's son. Patient to be discharged on prednisone taper. Have advised moisturizing with topical Vaseline twice a day. Primary care followup advised and return precautions provided. Patient agreeable to plan with no unaddressed concerns.  I personally performed the services described in this documentation, which was scribed in my presence. The recorded information has been reviewed and is accurate.     Antonietta Breach, PA-C 12/10/13 [phone]

## 2013-12-10 NOTE — ED Provider Notes (Signed)
Medical screening examination/treatment/procedure(s) were performed by non-physician practitioner and as supervising physician I was immediately available for consultation/collaboration.   EKG Interpretation None       Phillippe Orlick T Taleisha Kaczynski, MD 12/10/13 1151 

## 2014-03-23 ENCOUNTER — Emergency Department (HOSPITAL_COMMUNITY): Payer: Self-pay

## 2014-03-23 ENCOUNTER — Encounter (HOSPITAL_COMMUNITY): Payer: Self-pay | Admitting: Emergency Medicine

## 2014-03-23 ENCOUNTER — Emergency Department (HOSPITAL_COMMUNITY)
Admission: EM | Admit: 2014-03-23 | Discharge: 2014-03-23 | Disposition: A | Discharge status: Home / Self Care | Payer: Self-pay | Attending: Emergency Medicine | Admitting: Emergency Medicine

## 2014-03-23 DIAGNOSIS — K219 Gastro-esophageal reflux disease without esophagitis: Secondary | ICD-10-CM | POA: Insufficient documentation

## 2014-03-23 DIAGNOSIS — Z7952 Long term (current) use of systemic steroids: Secondary | ICD-10-CM | POA: Insufficient documentation

## 2014-03-23 DIAGNOSIS — N200 Calculus of kidney: Secondary | ICD-10-CM | POA: Insufficient documentation

## 2014-03-23 DIAGNOSIS — Z791 Long term (current) use of non-steroidal anti-inflammatories (NSAID): Secondary | ICD-10-CM | POA: Insufficient documentation

## 2014-03-23 DIAGNOSIS — Z720 Tobacco use: Secondary | ICD-10-CM | POA: Insufficient documentation

## 2014-03-23 DIAGNOSIS — Z79899 Other long term (current) drug therapy: Secondary | ICD-10-CM | POA: Insufficient documentation

## 2014-03-23 LAB — CBC WITH DIFFERENTIAL/PLATELET
Basophils Absolute: 0 10*3/uL (ref 0.0–0.1)
Basophils Relative: 0 % (ref 0–1)
Eosinophils Absolute: 0.2 10*3/uL (ref 0.0–0.7)
Eosinophils Relative: 3 % (ref 0–5)
HEMATOCRIT: 40.9 % (ref 39.0–52.0)
Hemoglobin: 13.6 g/dL (ref 13.0–17.0)
Lymphocytes Relative: 37 % (ref 12–46)
Lymphs Abs: 2.4 10*3/uL (ref 0.7–4.0)
MCH: 28.2 pg (ref 26.0–34.0)
MCHC: 33.3 g/dL (ref 30.0–36.0)
MCV: 84.9 fL (ref 78.0–100.0)
Monocytes Absolute: 0.6 10*3/uL (ref 0.1–1.0)
Monocytes Relative: 10 % (ref 3–12)
NEUTROS ABS: 3.3 10*3/uL (ref 1.7–7.7)
NEUTROS PCT: 50 % (ref 43–77)
Platelets: 230 10*3/uL (ref 150–400)
RBC: 4.82 MIL/uL (ref 4.22–5.81)
RDW: 12.5 % (ref 11.5–15.5)
WBC: 6.6 10*3/uL (ref 4.0–10.5)

## 2014-03-23 LAB — BASIC METABOLIC PANEL
ANION GAP: 8 (ref 5–15)
BUN: 15 mg/dL (ref 6–23)
CHLORIDE: 109 mmol/L (ref 96–112)
CO2: 24 mmol/L (ref 19–32)
CREATININE: 1.08 mg/dL (ref 0.50–1.35)
Calcium: 8.8 mg/dL (ref 8.4–10.5)
GFR calc Af Amer: >90 mL/min (ref >90)
Glucose, Bld: 118 mg/dL — ABNORMAL HIGH (ref 70–99)
Potassium: 3.7 mmol/L (ref 3.5–5.1)
SODIUM: 141 mmol/L (ref 135–145)

## 2014-03-23 LAB — URINALYSIS, ROUTINE W REFLEX MICROSCOPIC
Bilirubin Urine: NEGATIVE
Glucose, UA: NEGATIVE mg/dL
HGB URINE DIPSTICK: NEGATIVE
Ketones, ur: NEGATIVE mg/dL
Leukocytes, UA: NEGATIVE
Nitrite: NEGATIVE
Protein, ur: NEGATIVE mg/dL
Specific Gravity, Urine: 1.026 (ref 1.005–1.030)
Urobilinogen, UA: 1 mg/dL (ref 0.0–1.0)
pH: 6 (ref 5.0–8.0)

## 2014-03-23 MED ORDER — KETOROLAC TROMETHAMINE 30 MG/ML IJ SOLN
30.0000 mg | Freq: Once | INTRAMUSCULAR | Status: AC
  Administered 2014-03-23: 30 mg via INTRAVENOUS
  Filled 2014-03-23: qty 1

## 2014-03-23 MED ORDER — SODIUM CHLORIDE 0.9 % IV BOLUS (SEPSIS)
1000.0000 mL | Freq: Once | INTRAVENOUS | Status: AC
  Administered 2014-03-23: 1000 mL via INTRAVENOUS

## 2014-03-23 MED ORDER — ONDANSETRON HCL 4 MG/2ML IJ SOLN
4.0000 mg | Freq: Once | INTRAMUSCULAR | Status: AC
  Administered 2014-03-23: 4 mg via INTRAVENOUS
  Filled 2014-03-23: qty 2

## 2014-03-23 MED ORDER — HYDROCODONE-ACETAMINOPHEN 5-325 MG PO TABS: 2.0000 | ORAL_TABLET | ORAL | Status: DC | PRN

## 2014-03-23 MED ORDER — IBUPROFEN 800 MG PO TABS: 800.0000 mg | ORAL_TABLET | Freq: Three times a day (TID) | ORAL | Status: DC

## 2014-03-23 MED ORDER — IOHEXOL 300 MG/ML  SOLN: 50.0000 mL | Freq: Once | INTRAMUSCULAR | Status: DC | PRN

## 2014-03-23 NOTE — ED Provider Notes (Signed)
CSN: [id number]     Arrival date & time 03/23/14  1033 History   First MD Initiated Contact with Patient 03/23/14 1040     Chief Complaint  Patient presents with  . Flank Pain    left sided     (Consider location/radiation/quality/duration/timing/severity/associated sxs/prior Treatment) HPI Comments: Patient is a [age] year old male with no past medical history who presents with flank pain that started this morning. The pain is located in the left flank and radiates around to his left abdomen. The pain is described as aching and severe. The pain started gradually and progressively worsened since the onset. No alleviating/aggravating factors. The patient has tried nothing for symptoms without relief. Associated symptoms include nothing. Patient denies fever, headache, NVD, chest pain, SOB, dysuria, constipation.    Past Medical History  Diagnosis Date  . Reflux    Past Surgical History  Procedure Laterality Date  . Wrist surgery Left 2010  . Finger surgery Right 2002    R small finger   Family History  Problem Relation Age of Onset  . Hyperlipidemia Mother    History  Substance Use Topics  . Smoking status: Current Every Day Smoker    Types: Cigarettes  . Smokeless tobacco: Not on file  . Alcohol Use: Yes     Comment: occasional    Review of Systems  Constitutional: Negative for fever, chills and fatigue.  HENT: Negative for trouble swallowing.   Eyes: Negative for visual disturbance.  Respiratory: Negative for shortness of breath.   Cardiovascular: Negative for chest pain and palpitations.  Gastrointestinal: Negative for nausea, vomiting, abdominal pain and diarrhea.  Genitourinary: Positive for flank pain. Negative for dysuria and difficulty urinating.  Musculoskeletal: Negative for arthralgias and neck pain.  Skin: Negative for color change.  Neurological: Negative for dizziness and weakness.  Psychiatric/Behavioral: Negative for dysphoric mood.      Allergies   Review of patient's allergies indicates no known allergies.  Home Medications   Prior to Admission medications   Medication Sig Start Date End Date Taking? Authorizing Provider  diphenhydrAMINE (BENADRYL) 25 MG tablet Take 1 tablet (25 mg total) by mouth every 6 (six) hours as needed for itching (Rash). 12/06/13   Antony MaduraKelly Humes, PA-C  naproxen (NAPROSYN) 500 MG tablet Take 1 tablet (500 mg total) by mouth 2 (two) times daily. 11/22/13   Linwood DibblesJon Knapp, MD  omeprazole (PRILOSEC) 40 MG capsule Take 40 mg by mouth daily.    Historical Provider, MD  predniSONE (DELTASONE) 20 MG tablet Take 2 tablets (40 mg total) by mouth daily. Take 40 mg by mouth daily for 3 days, then 20mg  by mouth daily for 3 days, then 10mg  daily for 3 days 12/06/13   Antony MaduraKelly Humes, PA-C   There were no vitals taken for this visit. Physical Exam  Constitutional: He is oriented to person, place, and time. He appears well-developed and well-nourished. No distress.  HENT:  Head: Normocephalic and atraumatic.  Eyes: Conjunctivae and EOM are normal.  Neck: Normal range of motion.  Cardiovascular: Normal rate and regular rhythm.  Exam reveals no gallop and no friction rub.   No murmur heard. Pulmonary/Chest: Effort normal and breath sounds normal. He has no wheezes. He has no rales. He exhibits no tenderness.  Abdominal: Soft. He exhibits no distension. There is no tenderness. There is no rebound.  Mild left sided abdominal tenderness. No focal tenderness.   Genitourinary:  Left CVA tenderness.   Musculoskeletal: Normal range of motion.  Neurological: He is  alert and oriented to person, place, and time. Coordination normal.  Speech is goal-oriented. Moves limbs without ataxia.   Skin: Skin is warm and dry.  Psychiatric: He has a normal mood and affect. His behavior is normal.  Nursing note and vitals reviewed.   ED Course  Procedures (including critical care time) Labs Review Labs Reviewed  BASIC METABOLIC PANEL - Abnormal;  Notable for the following:    Glucose, Bld 118 (*)    All other components within normal limits  CBC WITH DIFFERENTIAL/PLATELET  URINALYSIS, ROUTINE W REFLEX MICROSCOPIC    Imaging Review Ct Abdomen Pelvis Wo Contrast  03/23/2014   CLINICAL DATA:  Low back and left flank pain since this morning.  EXAM: CT ABDOMEN AND PELVIS WITHOUT CONTRAST  TECHNIQUE: Multidetector CT imaging of the abdomen and pelvis was performed following the standard protocol without IV contrast.  COMPARISON:  09/06/2006  FINDINGS: Lower chest: The lung bases are clear of acute process. No pleural effusion or pulmonary lesions. The heart is normal in size. No pericardial effusion. The distal esophagus and aorta are unremarkable.  Hepatobiliary: The unenhanced appearance of the liver is unremarkable. No focal hepatic lesions or intrahepatic biliary dilatation. The gallbladder is normal. No common bowel duct dilatation.  Pancreas: Normal  Spleen: Mildly enlarged (13.5 x 11.2 x 10.0 cm).  No focal lesions.  Adrenals/Urinary Tract: There is mild left-sided hydroureteronephrosis due to a 2 mm calculus at the left UPJ. No renal calculi. No renal mass.  Stomach/Bowel: The stomach, duodenum, small bowel and colon are grossly normal without oral contrast. The terminal ileum is normal. The appendix is normal. No inflammatory changes, mass lesions or obstructive findings.  Vascular/Lymphatic: No mesenteric or retroperitoneal mass or adenopathy. The aorta is normal in caliber.  Pelvis: No pelvic mass or adenopathy. No free pelvic fluid collections. The prostate gland and seminal vesicles are unremarkable. A urachal remnant is noted.  Musculoskeletal: No significant bony findings.  IMPRESSION: 2 mm left UVJ calculus causing mild left-sided hydroureteronephrosis.  Splenomegaly (splenic volume is 756 cubic cm).  No focal lesions.   Electronically Signed   By: Loralie Champagne M.D.   On: 03/23/2014 12:22     EKG Interpretation None      MDM    Final diagnoses:  Kidney stone on left side    1:10 PM Patient's labs unremarkable for acute changes. CT shows 2mm nonobstructive UVJ stone. Vitals stable and patient afebrile. Urine shows no infection. Patient reports relief with toradol. Creatinine normal.    Emilia Beck, PA-C 03/23/14 1416  Juliet Rude. Rubin Payor, MD 03/24/14 1650

## 2014-03-23 NOTE — Discharge Instructions (Signed)
Take ibuprofen for pain. Take Vicodin for additional pain relief. You may take these medications together. Refer to attached documents for more information.

## 2014-08-19 ENCOUNTER — Emergency Department (HOSPITAL_COMMUNITY)
Admission: EM | Admit: 2014-08-19 | Discharge: 2014-08-20 | Disposition: A | Discharge status: Home / Self Care | Payer: Self-pay | Attending: Emergency Medicine | Admitting: Emergency Medicine

## 2014-08-19 ENCOUNTER — Encounter (HOSPITAL_COMMUNITY): Payer: Self-pay

## 2014-08-19 DIAGNOSIS — R21 Rash and other nonspecific skin eruption: Secondary | ICD-10-CM | POA: Insufficient documentation

## 2014-08-19 DIAGNOSIS — K219 Gastro-esophageal reflux disease without esophagitis: Secondary | ICD-10-CM | POA: Insufficient documentation

## 2014-08-19 DIAGNOSIS — R109 Unspecified abdominal pain: Secondary | ICD-10-CM

## 2014-08-19 DIAGNOSIS — Z79899 Other long term (current) drug therapy: Secondary | ICD-10-CM | POA: Insufficient documentation

## 2014-08-19 DIAGNOSIS — R1084 Generalized abdominal pain: Secondary | ICD-10-CM | POA: Insufficient documentation

## 2014-08-19 MED ORDER — SODIUM CHLORIDE 0.9 % IV BOLUS (SEPSIS)
1000.0000 mL | Freq: Once | INTRAVENOUS | Status: AC
  Administered 2014-08-20: 1000 mL via INTRAVENOUS

## 2014-08-19 MED ORDER — HYDROMORPHONE HCL 1 MG/ML IJ SOLN
1.0000 mg | Freq: Once | INTRAMUSCULAR | Status: AC
  Administered 2014-08-20: 1 mg via INTRAVENOUS
  Filled 2014-08-19: qty 1

## 2014-08-19 NOTE — ED Notes (Signed)
Pt presents with c/o bilateral flank pain that started around 9 pm tonight. Pt reports that he has a hx of kidney stones, no vomiting at this time, nausea only.

## 2014-08-19 NOTE — ED Provider Notes (Signed)
CSN: [id number]     Arrival date & time 08/19/14  2316 History   First MD Initiated Contact with Patient 08/19/14 2325     Chief Complaint  Patient presents with  . Flank Pain     (Consider location/radiation/quality/duration/timing/severity/associated sxs/prior Treatment) Patient is a 29 y.o. male presenting with flank pain. The history is provided by the patient.  Flank Pain Associated symptoms include a rash. Pertinent negatives include no abdominal pain or vomiting.   [name] is a 29 year old male with history of kidney stones, who presents to the emergency department after sudden onset right flank pain which occurred while drinking his fifth monster today at approximately 8 PM.  He felt sluggish, had some nausea and with increasing back pain extending to his abdomen, presented to the emergency department for evaluation.  He describes the pain as sharp, and increasing and decreasing in intensity, made worse with movement and inspiration.  He denies any vomiting, diarrhea, fever, chills, sweats.  Past Medical History  Diagnosis Date  . Reflux    Past Surgical History  Procedure Laterality Date  . Wrist surgery Left 2010  . Finger surgery Right 2002    R small finger   Family History  Problem Relation Age of Onset  . Hyperlipidemia Mother    History  Substance Use Topics  . Smoking status: Former Smoker    Types: Cigarettes  . Smokeless tobacco: Not on file  . Alcohol Use: Yes     Comment: occasional    Review of Systems  Constitutional: Negative.   HENT: Negative.   Eyes: Negative.   Respiratory: Negative.   Cardiovascular: Negative.   Gastrointestinal: Negative for vomiting, abdominal pain, diarrhea, constipation, blood in stool, abdominal distention and rectal pain.  Endocrine: Negative.   Genitourinary: Positive for flank pain. Negative for dysuria, urgency, frequency and hematuria.  Musculoskeletal: Negative.   Skin: Positive for rash. Negative for color  change and pallor.  Neurological: Negative.     Allergies  Review of patient's allergies indicates no known allergies.  Home Medications   Prior to Admission medications   Medication Sig Start Date End Date Taking? Authorizing Provider  [employer] ALLERGY RELIEF D 60-120 MG per tablet Take 1 tablet by mouth daily as needed (For allergies.).  01/06/14   Historical Provider, MD  diphenhydrAMINE (BENADRYL) 25 MG tablet Take 1 tablet (25 mg total) by mouth every 6 (six) hours as needed for itching (Rash). Patient not taking: Reported on 03/23/2014 12/06/13   Antony MaduraKelly Humes, PA-C  HYDROcodone-acetaminophen (NORCO/VICODIN) 5-325 MG per tablet Take 2 tablets by mouth every 4 (four) hours as needed for moderate pain or severe pain. Patient not taking: Reported on 08/20/2014 03/23/14   Emilia BeckKaitlyn Szekalski, PA-C  hydrocortisone cream 1 % Apply 1 application topically 2 (two) times daily as needed for itching.    Historical Provider, MD  ibuprofen (ADVIL,MOTRIN) 800 MG tablet Take 1 tablet (800 mg total) by mouth 3 (three) times daily. Patient not taking: Reported on 08/20/2014 03/23/14   Emilia BeckKaitlyn Szekalski, PA-C  naproxen (NAPROSYN) 500 MG tablet Take 1 tablet (500 mg total) by mouth 2 (two) times daily. Patient taking differently: Take 500 mg by mouth 2 (two) times daily as needed (For pain.).  11/22/13   Linwood DibblesJon Knapp, MD  omeprazole (PRILOSEC) 40 MG capsule Take 40 mg by mouth daily.    Historical Provider, MD  predniSONE (DELTASONE) 20 MG tablet Take 2 tablets (40 mg total) by mouth daily. Take 40 mg by mouth daily for  3 days, then 20mg  by mouth daily for 3 days, then 10mg  daily for 3 days Patient not taking: Reported on 03/23/2014 12/06/13   Antony Madura, PA-C   BP 136/81 mmHg  Pulse 82  Temp(Src) 98 F (36.7 C) (Oral)  Resp 18  Ht 6\' 3"  (1.905 m)  Wt 300 lb (136.079 kg)  BMI 37.50 kg/m2  SpO2 100% Physical Exam  Constitutional: He is oriented to person, place, and time. He appears well-developed and well-nourished.  No distress.  HENT:  Head: Normocephalic and atraumatic.  Nose: Nose normal.  Mouth/Throat: Oropharynx is clear and moist. No oropharyngeal exudate.  Eyes: Conjunctivae and EOM are normal. Pupils are equal, round, and reactive to light. Right eye exhibits no discharge. Left eye exhibits no discharge. No scleral icterus.  Neck: Normal range of motion. No JVD present. No tracheal deviation present. No thyromegaly present.  Cardiovascular: Normal rate, regular rhythm, normal heart sounds and intact distal pulses.  Exam reveals no gallop and no friction rub.   No murmur heard. Pulmonary/Chest: Effort normal and breath sounds normal. No accessory muscle usage. No tachypnea. No respiratory distress. He has no decreased breath sounds. He has no wheezes. He has no rhonchi. He has no rales. He exhibits no tenderness.  Abdominal: Soft. Normal appearance and bowel sounds are normal. He exhibits no distension and no mass. There is no hepatosplenomegaly. There is generalized tenderness. There is no rigidity, no rebound, no guarding, no CVA tenderness, no tenderness at McBurney's point and negative Murphy's sign. No hernia.  Musculoskeletal: Normal range of motion. He exhibits no edema or tenderness.  Lymphadenopathy:    He has no cervical adenopathy.  Neurological: He is alert and oriented to person, place, and time. He has normal reflexes. No cranial nerve deficit. He exhibits normal muscle tone. Coordination normal.  Skin: Skin is warm and dry. Rash noted. Rash is papular. He is not diaphoretic. No cyanosis or erythema. No pallor. Nails show no clubbing.  Diffuse erythematous papular rash to his bilateral lower extremities, with excoriation, without exudate or edema  Psychiatric: He has a normal mood and affect. His behavior is normal. Judgment and thought content normal.  Nursing note and vitals reviewed.  ED Course  Procedures (including critical care time) Labs Review Labs Reviewed  CBC - Abnormal;  Notable for the following:    WBC 12.6 (*)    Hemoglobin 12.7 (*)    HCT 37.8 (*)    All other components within normal limits  COMPREHENSIVE METABOLIC PANEL - Abnormal; Notable for the following:    Potassium 3.3 (*)    Glucose, Bld 105 (*)    BUN 21 (*)    All other components within normal limits  LIPASE, BLOOD - Abnormal; Notable for the following:    Lipase 19 (*)    All other components within normal limits  URINALYSIS, ROUTINE W REFLEX MICROSCOPIC (NOT AT [facility])    Imaging Review Ct Renal Stone Study  08/20/2014   CLINICAL DATA:  Bilateral flank pain, onset around 21:00.  EXAM: CT ABDOMEN AND PELVIS WITHOUT CONTRAST  TECHNIQUE: Multidetector CT imaging of the abdomen and pelvis was performed following the standard protocol without IV contrast.  COMPARISON:  03/23/2014  FINDINGS: There is a 2 mm lower pole right collecting system calculus, best seen on the coronal images. No other urinary calculi are evident. There is no ureteral calculus. There is no hydronephrosis, ureteral dilatation or periureteral stranding. There are unremarkable unenhanced appearances of the liver, gallbladder, bile ducts,  pancreas, spleen, adrenals and kidneys. The abdominal aorta is normal in caliber. There is no atherosclerotic calcification. There is no adenopathy in the abdomen or pelvis. There are normal appearances of the stomach, small bowel and colon. The appendix is normal.  No acute inflammatory changes are evident in the abdomen or pelvis. There is no ascites.  There is no abnormality in the lower chest. There is no significant musculoskeletal abnormality.  There is a very small fat containing umbilical hernia.  IMPRESSION: 1. Right nephrolithiasis, with a 2 mm lower pole right collecting system calculus 2. Negative for hydronephrosis, ureteral dilatation or ureteral calculus. 3. Small fat containing umbilical hernia. 4. No acute findings are evident in the abdomen or pelvis.   Electronically Signed   By:  Ellery Plunk M.D.   On: 08/20/2014 00:57     EKG Interpretation None      MDM   Final diagnoses:  Right flank pain    Right flank pain - hx of kidney stone with nausea - colicky back and right upper quadrant pain with generalized abdominal tenderness.    Last eaten 5pm  Plan - CMP, lipase, CBC, UA, IVF + pain meds, renal stone study  Labs are pertinent for hypokalemia increased BUN mild leukocytosis - likely from dehydration and large consumption of caffeine energy drinks.  Patient reports being weak for the past 48 hours.  Renal stone study shows nephrolithiasis in lower pole, no hydronephrosis, ureteral dilatation or ureteral calculus. No other acute abdominal pathology at this time.  UA did not reveal any hematuria or UTI.   Patient is hemodynamically stable at this time, pain improved, patient is sleeping. Will discharge with NSAIDs, and return precautions given verbally acknowledged.      Danelle Berry, PA-C 08/20/14 1610  Loren Racer, MD 08/20/14 [phone]

## 2014-08-20 ENCOUNTER — Emergency Department (HOSPITAL_COMMUNITY): Payer: Self-pay

## 2014-08-20 LAB — URINALYSIS, ROUTINE W REFLEX MICROSCOPIC
BILIRUBIN URINE: NEGATIVE
GLUCOSE, UA: NEGATIVE mg/dL
HGB URINE DIPSTICK: NEGATIVE
Ketones, ur: NEGATIVE mg/dL
Leukocytes, UA: NEGATIVE
NITRITE: NEGATIVE
PROTEIN: NEGATIVE mg/dL
Specific Gravity, Urine: 1.029 (ref 1.005–1.030)
Urobilinogen, UA: 1 mg/dL (ref 0.0–1.0)
pH: 6 (ref 5.0–8.0)

## 2014-08-20 LAB — CBC
HEMATOCRIT: 37.8 % — AB (ref 39.0–52.0)
HEMOGLOBIN: 12.7 g/dL — AB (ref 13.0–17.0)
MCH: 28.5 pg (ref 26.0–34.0)
MCHC: 33.6 g/dL (ref 30.0–36.0)
MCV: 84.8 fL (ref 78.0–100.0)
Platelets: 225 10*3/uL (ref 150–400)
RBC: 4.46 MIL/uL (ref 4.22–5.81)
RDW: 13.2 % (ref 11.5–15.5)
WBC: 12.6 10*3/uL — ABNORMAL HIGH (ref 4.0–10.5)

## 2014-08-20 LAB — COMPREHENSIVE METABOLIC PANEL
ALT: 19 U/L (ref 17–63)
ANION GAP: 11 (ref 5–15)
AST: 22 U/L (ref 15–41)
Albumin: 4.1 g/dL (ref 3.5–5.0)
Alkaline Phosphatase: 66 U/L (ref 38–126)
BILIRUBIN TOTAL: 0.4 mg/dL (ref 0.3–1.2)
BUN: 21 mg/dL — ABNORMAL HIGH (ref 6–20)
CALCIUM: 8.9 mg/dL (ref 8.9–10.3)
CO2: 24 mmol/L (ref 22–32)
Chloride: 106 mmol/L (ref 101–111)
Creatinine, Ser: 1.19 mg/dL (ref 0.61–1.24)
GFR calc Af Amer: >60 mL/min (ref >60)
GFR calc non Af Amer: >60 mL/min (ref >60)
Glucose, Bld: 105 mg/dL — ABNORMAL HIGH (ref 65–99)
Potassium: 3.3 mmol/L — ABNORMAL LOW (ref 3.5–5.1)
Sodium: 141 mmol/L (ref 135–145)
Total Protein: 7.7 g/dL (ref 6.5–8.1)

## 2014-08-20 LAB — LIPASE, BLOOD: LIPASE: 19 U/L — AB (ref 22–51)

## 2014-08-20 MED ORDER — IBUPROFEN 800 MG PO TABS: 800.0000 mg | ORAL_TABLET | Freq: Three times a day (TID) | ORAL | Status: DC

## 2014-08-20 MED ORDER — KETOROLAC TROMETHAMINE 30 MG/ML IJ SOLN
30.0000 mg | Freq: Once | INTRAMUSCULAR | Status: AC
  Administered 2014-08-20: 30 mg via INTRAVENOUS
  Filled 2014-08-20: qty 1

## 2014-08-20 NOTE — Discharge Instructions (Signed)
Abdominal Pain °Many things can cause abdominal pain. Usually, abdominal pain is not caused by a disease and will improve without treatment. It can often be observed and treated at home. Your health care provider will do a physical exam and possibly order blood tests and X-rays to help determine the seriousness of your pain. However, in many cases, more time must pass before a clear cause of the pain can be found. Before that point, your health care provider may not know if you need more testing or further treatment. °HOME CARE INSTRUCTIONS  °Monitor your abdominal pain for any changes. The following actions may help to alleviate any discomfort you are experiencing: °· Only take over-the-counter or prescription medicines as directed by your health care provider. °· Do not take laxatives unless directed to do so by your health care provider. °· Try a clear liquid diet (broth, tea, or water) as directed by your health care provider. Slowly move to a bland diet as tolerated. °SEEK MEDICAL CARE IF: °· You have unexplained abdominal pain. °· You have abdominal pain associated with nausea or diarrhea. °· You have pain when you urinate or have a bowel movement. °· You experience abdominal pain that wakes you in the night. °· You have abdominal pain that is worsened or improved by eating food. °· You have abdominal pain that is worsened with eating fatty foods. °· You have a fever. °SEEK IMMEDIATE MEDICAL CARE IF:  °· Your pain does not go away within 2 hours. °· You keep throwing up (vomiting). °· Your pain is felt only in portions of the abdomen, such as the right side or the left lower portion of the abdomen. °· You pass bloody or black tarry stools. °MAKE SURE YOU: °· Understand these instructions.   °· Will watch your condition.   °· Will get help right away if you are not doing well or get worse.   °Document Released: 11/14/2004 Document Revised: 02/09/2013 Document Reviewed: 10/14/2012 °[employer]® Patient Information  ©2015 [employer]. This information is not intended to replace advice given to you by your health care provider. Make sure you discuss any questions you have with your health care provider. ° ° °Flank Pain °Flank pain refers to pain that is located on the side of the body between the upper abdomen and the back. The pain may occur over a short period of time (acute) or may be long-term or reoccurring (chronic). It may be mild or severe. Flank pain can be caused by many things. °CAUSES  °Some of the more common causes of flank pain include: °· Muscle strains.   °· Muscle spasms.   °· A disease of your spine (vertebral disk disease).   °· A lung infection (pneumonia).   °· Fluid around your lungs (pulmonary edema).   °· A kidney infection.   °· Kidney stones.   °· A very painful skin rash caused by the chickenpox virus (shingles).   °· Gallbladder disease.   °HOME CARE INSTRUCTIONS  °Home care will depend on the cause of your pain. In general, °· Rest as directed by your caregiver. °· Drink enough fluids to keep your urine clear or pale yellow. °· Only take over-the-counter or prescription medicines as directed by your caregiver. Some medicines may help relieve the pain. °· Tell your caregiver about any changes in your pain. °· Follow up with your caregiver as directed. °SEEK IMMEDIATE MEDICAL CARE IF:  °· Your pain is not controlled with medicine.   °· You have new or worsening symptoms. °· Your pain increases.   °· You have abdominal   pain.   °· You have shortness of breath.   °· You have persistent nausea or vomiting.   °· You have swelling in your abdomen.   °· You feel faint or pass out.   °· You have blood in your urine. °· You have a fever or persistent symptoms for more than 2-3 days. °· You have a fever and your symptoms suddenly get worse. °MAKE SURE YOU:  °· Understand these instructions. °· Will watch your condition. °· Will get help right away if you are not doing well or get worse. °Document Released:  03/28/2005 Document Revised: 10/30/2011 Document Reviewed: 09/19/2011 °[employer]® Patient Information ©2015 [employer]. This information is not intended to replace advice given to you by your health care provider. Make sure you discuss any questions you have with your health care provider. ° °

## 2014-11-17 ENCOUNTER — Emergency Department (HOSPITAL_COMMUNITY): Payer: BLUE CROSS/BLUE SHIELD

## 2014-11-17 ENCOUNTER — Emergency Department (HOSPITAL_COMMUNITY)
Admission: EM | Admit: 2014-11-17 | Discharge: 2014-11-17 | Disposition: A | Discharge status: Home / Self Care | Payer: BLUE CROSS/BLUE SHIELD | Attending: Emergency Medicine | Admitting: Emergency Medicine

## 2014-11-17 ENCOUNTER — Encounter (HOSPITAL_COMMUNITY): Payer: Self-pay | Admitting: *Deleted

## 2014-11-17 DIAGNOSIS — R202 Paresthesia of skin: Secondary | ICD-10-CM | POA: Diagnosis not present

## 2014-11-17 DIAGNOSIS — R42 Dizziness and giddiness: Secondary | ICD-10-CM | POA: Diagnosis not present

## 2014-11-17 DIAGNOSIS — R630 Anorexia: Secondary | ICD-10-CM | POA: Diagnosis not present

## 2014-11-17 DIAGNOSIS — Z79899 Other long term (current) drug therapy: Secondary | ICD-10-CM | POA: Diagnosis not present

## 2014-11-17 DIAGNOSIS — Z791 Long term (current) use of non-steroidal anti-inflammatories (NSAID): Secondary | ICD-10-CM | POA: Insufficient documentation

## 2014-11-17 DIAGNOSIS — R0789 Other chest pain: Secondary | ICD-10-CM

## 2014-11-17 DIAGNOSIS — K219 Gastro-esophageal reflux disease without esophagitis: Secondary | ICD-10-CM | POA: Diagnosis not present

## 2014-11-17 DIAGNOSIS — Z87891 Personal history of nicotine dependence: Secondary | ICD-10-CM | POA: Insufficient documentation

## 2014-11-17 DIAGNOSIS — M791 Myalgia: Secondary | ICD-10-CM | POA: Insufficient documentation

## 2014-11-17 DIAGNOSIS — R079 Chest pain, unspecified: Secondary | ICD-10-CM | POA: Diagnosis present

## 2014-11-17 DIAGNOSIS — R112 Nausea with vomiting, unspecified: Secondary | ICD-10-CM | POA: Diagnosis not present

## 2014-11-17 LAB — I-STAT TROPONIN, ED
Troponin i, poc: 0.07 ng/mL (ref 0.00–0.08)
Troponin i, poc: 0 ng/mL (ref 0.00–0.08)

## 2014-11-17 LAB — CBC WITH DIFFERENTIAL/PLATELET
BASOS PCT: 0 %
Basophils Absolute: 0 10*3/uL (ref 0.0–0.1)
Eosinophils Absolute: 0.1 10*3/uL (ref 0.0–0.7)
Eosinophils Relative: 2 %
HEMATOCRIT: 38.9 % — AB (ref 39.0–52.0)
Hemoglobin: 12.8 g/dL — ABNORMAL LOW (ref 13.0–17.0)
LYMPHS ABS: 2.4 10*3/uL (ref 0.7–4.0)
Lymphocytes Relative: 30 %
MCH: 28.3 pg (ref 26.0–34.0)
MCHC: 32.9 g/dL (ref 30.0–36.0)
MCV: 86.1 fL (ref 78.0–100.0)
MONO ABS: 0.8 10*3/uL (ref 0.1–1.0)
MONOS PCT: 9 %
NEUTROS ABS: 4.8 10*3/uL (ref 1.7–7.7)
Neutrophils Relative %: 59 %
Platelets: 238 10*3/uL (ref 150–400)
RBC: 4.52 MIL/uL (ref 4.22–5.81)
RDW: 13.1 % (ref 11.5–15.5)
WBC: 8 10*3/uL (ref 4.0–10.5)

## 2014-11-17 LAB — BASIC METABOLIC PANEL
Anion gap: 10 (ref 5–15)
BUN: 11 mg/dL (ref 6–20)
CO2: 21 mmol/L — ABNORMAL LOW (ref 22–32)
CREATININE: 0.98 mg/dL (ref 0.61–1.24)
Calcium: 8.6 mg/dL — ABNORMAL LOW (ref 8.9–10.3)
Chloride: 109 mmol/L (ref 101–111)
GFR calc Af Amer: >60 mL/min (ref >60)
GLUCOSE: 83 mg/dL (ref 65–99)
POTASSIUM: 3.4 mmol/L — AB (ref 3.5–5.1)
Sodium: 140 mmol/L (ref 135–145)

## 2014-11-17 MED ORDER — DIAZEPAM 5 MG PO TABS
5.0000 mg | ORAL_TABLET | Freq: Once | ORAL | Status: AC
  Administered 2014-11-17: 5 mg via ORAL
  Filled 2014-11-17: qty 1

## 2014-11-17 MED ORDER — SODIUM CHLORIDE 0.9 % IV BOLUS (SEPSIS)
1000.0000 mL | Freq: Once | INTRAVENOUS | Status: AC
  Administered 2014-11-17: 1000 mL via INTRAVENOUS

## 2014-11-17 MED ORDER — OXYCODONE-ACETAMINOPHEN 5-325 MG PO TABS
2.0000 | ORAL_TABLET | Freq: Once | ORAL | Status: AC
  Administered 2014-11-17: 2 via ORAL
  Filled 2014-11-17: qty 2

## 2014-11-17 MED ORDER — ASPIRIN 325 MG PO TABS
325.0000 mg | ORAL_TABLET | Freq: Once | ORAL | Status: AC
  Administered 2014-11-17: 325 mg via ORAL
  Filled 2014-11-17: qty 1

## 2014-11-17 NOTE — ED Notes (Signed)
Phlebotomy called rt blood work. Pt blood work was drawn and sent down. Lab states a tube station is broken and they don't have the blood. Aggie Cosier redrew the labs and sent to lab.

## 2014-11-17 NOTE — ED Notes (Signed)
Lab called and reports pt cbc clotted.

## 2014-11-17 NOTE — ED Provider Notes (Signed)
CSN: [id number]     Arrival date & time 11/17/14  1610 History   First MD Initiated Contact with Patient 11/17/14 1611     Chief Complaint  Patient presents with  . Chest Pain     (Consider location/radiation/quality/duration/timing/severity/associated sxs/prior Treatment) HPI Comments: 29 year old male presenting via EMS from work complaining of tingling in both of his arms, dizziness and left-sided chest pain. Around 12 PM while he was at work, states he started to feel some tingling in his left forearm and hand, gradually spreading over to the right. At the same time, he started to feel dizzy described as both a lightheadedness in the room spinning. No aggravating or alleviating factors. Admits to associated left-sided chest pain that is worse with lifting which he does a lot of while at work. The pain in his chest as described as someone squeezing him on the left side. Reports being under a lot of stress both at home and work. Feeling depressed without SI/HI since his wife left him about 16 days ago. Admits to associated nausea with one episode of vomiting. Denies confusion, facial asymmetry, extremity weakness. Still has numbness and tingling in his arms, on the left more than the right. Reports decreased appetite over the past 16 days and has not had anything to eat today but has been drinking water. Has generalized body aches and pains. No SOB. Admits to smoking "sometimes". No family hx of early heart disease. Cousin had a stroke in his 48s.  The history is provided by the patient and the EMS personnel.    Past Medical History  Diagnosis Date  . Reflux    Past Surgical History  Procedure Laterality Date  . Wrist surgery Left 2010  . Finger surgery Right 2002    R small finger   Family History  Problem Relation Age of Onset  . Hyperlipidemia Mother    Social History  Substance Use Topics  . Smoking status: Former Smoker    Types: Cigarettes  . Smokeless tobacco: None  .  Alcohol Use: Yes     Comment: occasional    Review of Systems  Cardiovascular: Positive for chest pain.  Gastrointestinal: Positive for nausea and vomiting.  Musculoskeletal: Negative for myalgias and arthralgias.  Neurological: Positive for dizziness, light-headedness and numbness.  Psychiatric/Behavioral:       + Stress.  All other systems reviewed and are negative.     Allergies  Review of patient's allergies indicates no known allergies.  Home Medications   Prior to Admission medications   Medication Sig Start Date End Date Taking? Authorizing Provider  [employer] ALLERGY RELIEF D 60-120 MG per tablet Take 1 tablet by mouth daily as needed (For allergies.).  01/06/14   Historical Provider, MD  diphenhydrAMINE (BENADRYL) 25 MG tablet Take 1 tablet (25 mg total) by mouth every 6 (six) hours as needed for itching (Rash). Patient not taking: Reported on 03/23/2014 12/06/13   Antony Madura, PA-C  HYDROcodone-acetaminophen (NORCO/VICODIN) 5-325 MG per tablet Take 2 tablets by mouth every 4 (four) hours as needed for moderate pain or severe pain. Patient not taking: Reported on 08/20/2014 03/23/14   Emilia Beck, PA-C  hydrocortisone cream 1 % Apply 1 application topically 2 (two) times daily as needed for itching.    Historical Provider, MD  ibuprofen (ADVIL,MOTRIN) 800 MG tablet Take 1 tablet (800 mg total) by mouth 3 (three) times daily. 08/20/14   Danelle Berry, PA-C  naproxen (NAPROSYN) 500 MG tablet Take 1 tablet (500 mg total)  by mouth 2 (two) times daily. Patient taking differently: Take 500 mg by mouth 2 (two) times daily as needed (For pain.).  11/22/13   Linwood Dibbles, MD  omeprazole (PRILOSEC) 40 MG capsule Take 40 mg by mouth daily.    Historical Provider, MD  predniSONE (DELTASONE) 20 MG tablet Take 2 tablets (40 mg total) by mouth daily. Take 40 mg by mouth daily for 3 days, then  by mouth daily for 3 days, then  daily for 3 days Patient not taking: Reported on 03/23/2014 12/06/13    Antony Madura, PA-C   BP 146/83 mmHg  Pulse 65  Temp(Src) 98 F (36.7 C) (Oral)  Resp 15  SpO2 100% Physical Exam  Constitutional: He is oriented to person, place, and time. He appears well-developed and well-nourished. No distress.  HENT:  Head: Normocephalic and atraumatic.  Mouth/Throat: Oropharynx is clear and moist.  Eyes: Conjunctivae and EOM are normal. Pupils are equal, round, and reactive to light.  Neck: Normal range of motion. Neck supple. No JVD present.  Cardiovascular: Normal rate, regular rhythm, normal heart sounds and intact distal pulses.   No extremity edema.  Pulmonary/Chest: Effort normal and breath sounds normal. No respiratory distress. He exhibits tenderness (left pectoral region).  Abdominal: Soft. Bowel sounds are normal. There is no tenderness.  Musculoskeletal: Normal range of motion. He exhibits no edema.  Neurological: He is alert and oriented to person, place, and time. He has normal strength. No sensory deficit. Coordination and gait normal. GCS eye subscore is 4. GCS verbal subscore is 5. GCS motor subscore is 6.  Speech fluent, goal oriented. Moves extremities without ataxia. Normal finger-to-nose bilateral. Sensation LUE decreased to light touch compared to RUE. Strength UE/LE 5/5 equal BL.  Skin: Skin is warm and dry. No rash noted. He is not diaphoretic.  Psychiatric: His behavior is normal. He exhibits a depressed mood. He expresses no homicidal and no suicidal ideation.  Nursing note and vitals reviewed.   ED Course  Procedures (including critical care time) Labs Review Labs Reviewed  BASIC METABOLIC PANEL - Abnormal; Notable for the following:    Potassium 3.4 (*)    CO2 21 (*)    Calcium 8.6 (*)    All other components within normal limits  CBC WITH DIFFERENTIAL/PLATELET  Rosezena Sensor, ED    Imaging Review Dg Chest 2 View  11/17/2014   CLINICAL DATA:  Chest pain for 1 day  EXAM: CHEST  2 VIEW  COMPARISON:  March 11, 2013   FINDINGS: The heart size and mediastinal contours are within normal limits. There is no focal infiltrate, pulmonary edema, or pleural effusion. The visualized skeletal structures are unremarkable.  IMPRESSION: No active cardiopulmonary disease.   Electronically Signed   By: Sherian Rein M.D.   On: 11/17/2014 17:21   I have personally reviewed and evaluated these images and lab results as part of my medical decision-making.   EKG Interpretation None      MDM   Final diagnoses:  None   Non-toxic appearing, NAD. AFVSS. CP reproducible. EKG without acute findings. CP most likely from stress at home/work. Low suspicion for cardiac. Doubt PE. PERC negative. Dizziness improved with valium and fluids, CP did not. Will give percocet. Labs were collected and sent to main lab, and when checking on results, lab stating they never received them. Labs redrawn, and lab called stating CBC clotted. CBC pending. Trop 0.07. Will check delta trop. If negative, will d/c home. Pt signed out to [facility]  Manus Rudd, NP at shift change.  Discussed with attending Dr. Silverio Lay who agrees with plan of care.  Kathrynn Speed, PA-C 11/17/14 1954  Richardean Canal, MD 11/17/14 2224

## 2014-11-17 NOTE — Discharge Instructions (Signed)
Chest Pain (Nonspecific) °It is often hard to give a diagnosis for the cause of chest pain. There is always a chance that your pain could be related to something serious, such as a heart attack or a blood clot in the lungs. You need to follow up with your doctor. °HOME CARE °· If antibiotic medicine was given, take it as directed by your doctor. Finish the medicine even if you start to feel better. °· For the next few days, avoid activities that bring on chest pain. Continue physical activities as told by your doctor. °· Do not use any tobacco products. This includes cigarettes, chewing tobacco, and e-cigarettes. °· Avoid drinking alcohol. °· Only take medicine as told by your doctor. °· Follow your doctor's suggestions for more testing if your chest pain does not go away. °· Keep all doctor visits you made. °GET HELP IF: °· Your chest pain does not go away, even after treatment. °· You have a rash with blisters on your chest. °· You have a fever. °GET HELP RIGHT AWAY IF:  °· You have more pain or pain that spreads to your arm, neck, jaw, back, or belly (abdomen). °· You have shortness of breath. °· You cough more than usual or cough up blood. °· You have very bad back or belly pain. °· You feel sick to your stomach (nauseous) or throw up (vomit). °· You have very bad weakness. °· You pass out (faint). °· You have chills. °This is an emergency. Do not wait to see if the problems will go away. Call your local emergency services (911 in [place] Do not drive yourself to the hospital. °MAKE SURE YOU:  °· Understand these instructions. °· Will watch your condition. °· Will get help right away if you are not doing well or get worse. °Document Released: 07/24/2007 Document Revised: 02/09/2013 Document Reviewed: 07/24/2007 °ExitCare® Patient Information ©2015 [employer]. This information is not intended to replace advice given to you by your health care provider. Make sure you discuss any questions you have with your  health care provider. ° ° °Emergency Department Resource Guide °1) Find a Doctor and Pay Out of Pocket °Although you won't have to find out who is covered by your insurance plan, it is a good idea to ask around and get recommendations. You will then need to call the office and see if the doctor you have chosen will accept you as a new patient and what types of options they offer for patients who are self-pay. Some doctors offer discounts or will set up payment plans for their patients who do not have insurance, but you will need to ask so you aren't surprised when you get to your appointment. ° °2) Contact Your Local Health Department °Not all health departments have doctors that can see patients for sick visits, but many do, so it is worth a call to see if yours does. If you don't know where your local health department is, you can check in your phone book. The CDC also has a tool to help you locate your state's health department, and many state websites also have listings of all of their local health departments. ° °3) Find a Walk-in Clinic °If your illness is not likely to be very severe or complicated, you may want to try a walk in clinic. These are popping up all over the country in pharmacies, drugstores, and shopping centers. They're usually staffed by nurse practitioners or physician assistants that have been trained to treat common   illnesses and complaints. They're usually fairly quick and inexpensive. However, if you have serious medical issues or chronic medical problems, these are probably not your best option. ° °No Primary Care Doctor: °- Call [facility] at  832-8000 - they can help you locate a primary care doctor that  accepts your insurance, provides certain services, etc. °- Physician Referral Service- 1-800-533-3463 ° °Chronic Pain Problems: °Organization         Address  Phone   Notes  °[facility]  (336) 297-2271 Patients need to be referred by their primary care doctor.   ° °Medication Assistance: °Organization         Address  Phone   Notes  °[place] [facility] [address] °[place], [place] [address] (336) 641-8030 --Must be a resident of [place] °-- Must have NO insurance coverage whatsoever (no Medicaid/ Medicare, etc.) °-- The pt. MUST have a primary care doctor that directs their care regularly and follows them in the community °  °[facility]  (866) 331-1348   °[employer]  (888) 892-1162   ° °Agencies that provide inexpensive medical care: °Organization         Address  Phone   Notes  °[facility]  (336) 832-8035   °[facility]    (336) 832-7272   °[facility] [address] °[place], [place] [address] (336) 832-4777   °[facility] [address], °[place] (336) 271-4999   °[facility]    (336) 373-0678   °[facility]    (336) 272-1050   °[facility] ° [address], [place] Phone:  (336) 832-4444, Fax:  (336) 832-4440 Hours of Operation:  9 am - 6 pm, M-F.  Also accepts Medicaid/Medicare and self-pay.  °[facility] ° [address], [place] Phone: (336) 832-3150, Fax: (336) 832-3151. Hours of Operation:  8:30 am - 5:30 pm, M-F.  Also accepts Medicaid and self-pay.  °[facility] [place] [address], [place] Phone: (336) 878-6027   °[facility] [address], [place], [place] (336)723-1848, Ext. 123 Mondays & Thursdays: 7-9 AM.  First 15 patients are seen on a first come, first serve basis. °  ° °Medicaid-accepting [place] Providers: ° °Organization         Address  Phone   Notes  °[facility] [address], [place] (336) 641-2100 Also accepts self-pay patients.  °[facility] [address], [place] ° (336) 856-9996   °[facility] [address] [place] [address], [place] (336) 288-8857   °[facility] [address] [place] [address], [place] (336) 299-7000   °Veita Bland [address], [place]  ° (336) 373-1557 Only accepts [place] Access Medicaid patients after they have their name applied to their card.  ° °Self-Pay (no insurance) in [place]: ° °Organization         Address  Phone   Notes  °Sickle Cell Patients, [facility] [address], [place] (336) 832-1970   °[facility] [address], [place] (336) 832-4400   °[facility] ° [address], [place] (336) 992-4800   °[facility]/Dr. Osei-Bonsu ° [address] [place] [address],  or [address],   [place] (336) 841-8500 Phone number for both [place] and [place] locations is the same.  °[facility] [address], [place] (336) 299-0000   °[facility] [address] [place] [address], [place] or [address] (336) 852-7530 °(336) 878-2260   °[facility] [address], [place] (336) 350-1642, phone; (336) 294-5005, fax Sees patients 1st and 3rd Saturday of every month.  Must not qualify for public or private insurance (i.e. Medicaid, Medicare, [place] Health Choice, Veterans' Benefits) • Household income should be no more than 200% of the poverty level •The clinic cannot treat you if you are pregnant or think you are pregnant • Sexually transmitted diseases are not treated at the clinic.  ° ° °Dental Care: °Organization         Address  Phone  Notes  °[facility] [address], [place] (336) 641-6152 Accepts children up to age 21 who are enrolled in Medicaid or [place] Health Choice; pregnant women with a Medicaid card; and children who have applied for Medicaid or [place] Health Choice, but were declined, whose parents can pay a reduced fee at time of service.    °[facility] [place]  [address], [place] (336) 641-7733 Accepts children up to age 21 who are enrolled in Medicaid or [place] Health Choice; pregnant women with a Medicaid card; and children who have applied for Medicaid or [place] Health Choice, but were declined, whose parents can pay a reduced fee at time of service.  °[facility] ° [address], [place] (336) 641-4533 Patients are seen by appointment only. Walk-ins are not accepted. [facility] will see patients 18 years of age and older. °Monday - Tuesday (8am-5pm) °Most Wednesdays (8:30-5pm) °$30 per visit, cash only  °[facility] ° [address], [place] (336) 641-4533 Patients are seen by appointment only. Walk-ins are not accepted. [facility] will see patients 18 years of age and older. °One Wednesday Evening (Monthly: Volunteer Based).  $30 per visit, cash only  °[facility]  (919) 537-3737 for adults; Children under age 4, call Graduate Pediatric Dentistry at (919) [phone]. Children aged 4-14, please call (919) [phone] to request a pediatric application. ° Dental services are provided in all areas of dental care including fillings, crowns and bridges, complete and partial dentures, implants, gum treatment, root canals, and extractions. Preventive care is also provided. Treatment is provided to both adults and children. °Patients are selected via a lottery and there is often a waiting list. °  °[facility] [address], °[place] ° (336) 763-8833 [email or web address] °  °[facility] [address], [place], [place] (336)723-1848, Ext. 123 Second and Fourth Thursday of each month, opens at 6:30 AM; Clinic ends at 9 AM.  Patients are seen on a first-come first-served basis, and a limited number are seen during each clinic.  ° °[facility] ° [address], [place],  (336)  723-7904   Eligibility Requirements °You must have lived in [place], [place], or [place] counties for at least the last three months. °  You cannot be eligible for state or federal sponsored healthcare insurance, including Veterans Administration, Medicaid, or Medicare. °  You generally cannot be eligible for healthcare insurance through your employer.  °  How to apply: °Eligibility screenings are held every Tuesday   and Wednesday afternoon from 1:00 pm until 4:00 pm. You do not need an appointment for the interview!  °[facility] [address], [place], [place] 336-631-2330   °[facility]  336-342-8273   °[facility]  336-703-3100   °[facility]  336-570-6415   ° °Behavioral Health Resources in the Community: °Intensive Outpatient Programs °Organization         Address  Phone  Notes  °[facility] [address], [place], [place] 336-878-6098   °[facility] [address], [place], [place] 336-832-9800   °[facility] [address], [place], [place] ° 336-882-2125   °[facility] [address],  °[place], [place] 1-800-853-5163 or 336-641-4981   °Substance Abuse Resources °Organization         Address  Phone  Notes  °[facility]  336-882-2125   °[facility]  336-784-9470   °[facility]  336-285-9073   °[facility]  336-845-3988   °Residential & Outpatient Substance Abuse Program  1-800-659-3381   °Psychological Services °Organization         Address  Phone  Notes  °[facility]  336- 832-9600   °[facility]  336- 378-7881   °[facility] [address], [place] 1-800-853-5163 or 336-641-4981   ° °Mobile Crisis Teams °Organization         Address  Phone  Notes  °[facility], Mobile Crisis Care Unit  1-877-626-1772   °Assertive °[facility] ° [address] [place], [place] 336-834-9664   °Sharon DeEsch [address] °[place] 336-554-5454   ° °Self-Help/Support Groups °Organization         Address  Phone             Notes  °[facility] [place] - variety of support groups  336- 373-1402 Call for more information  °Narcotics Anonymous (NA), [facility] [address], °[place]  2 meetings at this location  ° °Residential Treatment Programs °Organization         Address  Phone  Notes  °[facility] [address],    °[place]  1-866-801-8205   °[facility] ° [address], [place], [place] 704-293-8524   °[facility] [address], [place] 336-845-3988 Admissions: 8am-3pm M-F  °[facility] [address]    °[place], [place] 336-841-1104   °[facility] [address], [place], [place] 336-379-7146   °[facility] [place] [facility] [address]  °[place], [place] 336-285-9073   °[facility] - Intensive Outpatient [address], [place], [place] 336-852-3033   °[facility] [address]  °[place], [place] 1-877-615-2722 or 336-784-9470   °[facility] ([facility] [address] [place], [place] 336-227-7417 Accepts Medicaid  °[facility] [address]  °[place] 1-800-659-3381 Substance Abuse/Addiction Treatment  ° °[place] Behavioral Health Resources °Organization         Address  Phone  Notes  °[facility]  (888) 581-9988   °Julie Brannon, PhD [address] [place], [place]   (336) 349-5553 or (336) 951-0000   °[facility]   [address] °[place], [place] (336) 349-4454   °[facility] [address], [place], [place] (336) 342-8316 Insurance/Medicaid/sponsorship through [facility]  °[facility] [address]                                    [place], [place] 870-290-2067  Therapy/tele-psych/case  [facility] [address]   [place], [place] [phone]    Dr. Lolly Mustache  406-325-3204   [facility]  [employer] [facility] 1) [address], [place] 2) [address], [place] 3)  [address], [place] 251-797-7005 214-637-8583  458-615-5080   [facility] Child Abuse Hotline 912 734 2736 or 807-389-6029 (After Hours)     Today your evaluating the emergency department for chest pain.  He had 2 sets of negative cardiac markers, which is very reassuring and in normal EKG, You have  been given a resource guide to help you find a primary care physician also some information on stress and its physical manifestations

## 2014-11-17 NOTE — ED Provider Notes (Signed)
Patient has had 2 sets of negative cardiac markers.  Per report from previous provider.  He's been undergoing a lot of stress in his personal and professional life.  This most likely is the cause for his chest discomfort.  He has been given information on stress and its physical manifestations, as well as a resource guide to help him find a primary care physician  Earley Favor, NP 11/17/14 2248  Richardean Canal, MD 11/17/14 2249

## 2014-11-17 NOTE — ED Notes (Signed)
Pt arrives from work via [employer]. Pt states he was at work when he began having sharp left sided cp. Pt states he hasn't ate in 16 days rt depression from where his wife has left him. Pt in NAD.

## 2014-12-03 ENCOUNTER — Emergency Department (HOSPITAL_COMMUNITY)
Admission: EM | Admit: 2014-12-03 | Discharge: 2014-12-03 | Disposition: A | Discharge status: Home / Self Care | Payer: BLUE CROSS/BLUE SHIELD | Attending: Emergency Medicine | Admitting: Emergency Medicine

## 2014-12-03 ENCOUNTER — Encounter (HOSPITAL_COMMUNITY): Payer: Self-pay | Admitting: Emergency Medicine

## 2014-12-03 DIAGNOSIS — F419 Anxiety disorder, unspecified: Secondary | ICD-10-CM | POA: Diagnosis not present

## 2014-12-03 DIAGNOSIS — R51 Headache: Secondary | ICD-10-CM | POA: Insufficient documentation

## 2014-12-03 DIAGNOSIS — Z87891 Personal history of nicotine dependence: Secondary | ICD-10-CM | POA: Insufficient documentation

## 2014-12-03 DIAGNOSIS — Z8719 Personal history of other diseases of the digestive system: Secondary | ICD-10-CM | POA: Diagnosis not present

## 2014-12-03 DIAGNOSIS — R079 Chest pain, unspecified: Secondary | ICD-10-CM | POA: Diagnosis present

## 2014-12-03 LAB — BASIC METABOLIC PANEL
Anion gap: 8 (ref 5–15)
BUN: 16 mg/dL (ref 6–20)
CHLORIDE: 109 mmol/L (ref 101–111)
CO2: 24 mmol/L (ref 22–32)
Calcium: 8.8 mg/dL — ABNORMAL LOW (ref 8.9–10.3)
Creatinine, Ser: 0.88 mg/dL (ref 0.61–1.24)
GFR calc Af Amer: >60 mL/min (ref >60)
GFR calc non Af Amer: >60 mL/min (ref >60)
GLUCOSE: 101 mg/dL — AB (ref 65–99)
Potassium: 3.6 mmol/L (ref 3.5–5.1)
SODIUM: 141 mmol/L (ref 135–145)

## 2014-12-03 LAB — CBC
HEMATOCRIT: 39.4 % (ref 39.0–52.0)
Hemoglobin: 13 g/dL (ref 13.0–17.0)
MCH: 28.7 pg (ref 26.0–34.0)
MCHC: 33 g/dL (ref 30.0–36.0)
MCV: 87 fL (ref 78.0–100.0)
Platelets: 234 10*3/uL (ref 150–400)
RBC: 4.53 MIL/uL (ref 4.22–5.81)
RDW: 13.4 % (ref 11.5–15.5)
WBC: 8.2 10*3/uL (ref 4.0–10.5)

## 2014-12-03 LAB — I-STAT TROPONIN, ED: Troponin i, poc: 0 ng/mL (ref 0.00–0.08)

## 2014-12-03 MED ORDER — ACETAMINOPHEN 325 MG PO TABS
650.0000 mg | ORAL_TABLET | Freq: Once | ORAL | Status: AC
  Administered 2014-12-03: 650 mg via ORAL
  Filled 2014-12-03: qty 2

## 2014-12-03 MED ORDER — LORAZEPAM 1 MG PO TABS: 1.0000 mg | ORAL_TABLET | Freq: Three times a day (TID) | ORAL | Status: DC | PRN

## 2014-12-03 NOTE — ED Provider Notes (Signed)
CSN: [id number]     Arrival date & time 12/03/14  1957 History   First MD Initiated Contact with Patient 12/03/14 2003     Chief Complaint  Patient presents with  . Chest Pain     (Consider location/radiation/quality/duration/timing/severity/associated sxs/prior Treatment) HPI Comments: Normally healthy 29 year old male.  He is been undergoing a lot of stress in his personal life for the past 6 weeks.  Separation from his wife and custody battle over his 3 children.  Today is his weekend with the children and his wife called demanding may be returned.  They got into a verbal altercation he became upset, developed chest pain, right lower arm pain.  Patient states that since his last visit here on September 29 for the same issues.  He has been going to counseling.  The counselor has written a recommendation for his primary care physician to write a prescription for medication.  Unfortunately, he does not mean that there is no one home to read the letter but he has been unable to see his primary care physician due to his work hours. At this time is not having any chest pain, shortness of breath, but he does state that his lower left arm is uncomfortable  Patient is a 29 y.o. male presenting with chest pain. The history is provided by the patient.  Chest Pain Pain location:  Unable to specify Pain quality: dull   Pain radiates to:  L arm Pain radiates to the back: no   Pain severity:  Mild Onset quality:  Sudden Timing:  Sporadic Progression:  Resolved Chronicity:  Recurrent Context: stress   Relieved by:  Rest Worsened by:  Nothing tried Ineffective treatments:  None tried Associated symptoms: anxiety and headache   Associated symptoms: no cough, no diaphoresis, no fever, no nausea and no weakness     Past Medical History  Diagnosis Date  . Reflux    Past Surgical History  Procedure Laterality Date  . Wrist surgery Left 2010  . Finger surgery Right 2002    R small finger    Family History  Problem Relation Age of Onset  . Hyperlipidemia Mother    Social History  Substance Use Topics  . Smoking status: Former Smoker    Types: Cigarettes  . Smokeless tobacco: None  . Alcohol Use: Yes     Comment: occasional    Review of Systems  Constitutional: Negative for fever and diaphoresis.  Respiratory: Negative for cough.   Cardiovascular: Positive for chest pain.  Gastrointestinal: Negative for nausea.  Neurological: Positive for headaches. Negative for weakness.  Psychiatric/Behavioral: The patient is nervous/anxious.       Allergies  Review of patient's allergies indicates no known allergies.  Home Medications   Prior to Admission medications   Medication Sig Start Date End Date Taking? Authorizing Provider  LORazepam (ATIVAN) 1 MG tablet Take 1 tablet (1 mg total) by mouth 3 (three) times daily as needed for anxiety. 12/03/14   Earley FavorGail Ivar Domangue, NP   BP 141/82 mmHg  Pulse 73  Temp(Src) 98.3 F (36.8 C) (Oral)  Resp 14  SpO2 99% Physical Exam  Constitutional: He is oriented to person, place, and time. He appears well-developed and well-nourished.  HENT:  Head: Normocephalic.  Eyes: Pupils are equal, round, and reactive to light.  Neck: Normal range of motion.  Cardiovascular: Normal rate and regular rhythm.   Pulmonary/Chest: Effort normal and breath sounds normal.  Musculoskeletal: Normal range of motion.  Neurological: He is alert and oriented  to person, place, and time.  Skin: Skin is warm.  Psychiatric: His speech is normal and behavior is normal. Judgment and thought content normal. His mood appears anxious. Cognition and memory are normal.  Nursing note and vitals reviewed.   ED Course  Procedures (including critical care time) Labs Review Labs Reviewed  BASIC METABOLIC PANEL - Abnormal; Notable for the following:    Glucose, Bld 101 (*)    Calcium 8.8 (*)    All other components within normal limits  CBC  I-STAT TROPOININ, ED     Imaging Review No results found. I have personally reviewed and evaluated these images and lab results as part of my medical decision-making.   EKG Interpretation   Date/Time:  Saturday December 03 2014 20:05:31 EDT Ventricular Rate:  71 PR Interval:  138 QRS Duration: 103 QT Interval:  389 QTC Calculation: 423 R Axis:   42 Text Interpretation:  Sinus rhythm Normal ECG Confirmed by Blinda Leatherwood  MD,  CHRISTOPHER [phone]) on 12/03/2014 8:10:22 PM      MDM   Final diagnoses:  Anxiety         Earley Favor, NP 12/07/14 2110  Gilda Crease, MD 12/08/14 [phone]

## 2014-12-03 NOTE — Discharge Instructions (Signed)
Panic Attacks Panic attacks are sudden, short feelings of great fear or discomfort. You may have them for no reason when you are relaxed, when you are uneasy (anxious), or when you are sleeping.  HOME CARE  Take all your medicines as told.  Check with your doctor before starting new medicines.  Keep all doctor visits. GET HELP IF:  You are not able to take your medicines as told.  Your symptoms do not get better.  Your symptoms get worse. GET HELP RIGHT AWAY IF:  Your attacks seem different than your normal attacks.  You have thoughts about hurting yourself or others.  You take panic attack medicine and you have a side effect. MAKE SURE YOU:  Understand these instructions.  Will watch your condition.  Will get help right away if you are not doing well or get worse.   This information is not intended to replace advice given to you by your health care provider. Make sure you discuss any questions you have with your health care provider.   Document Released: 03/09/2010 Document Revised: 11/25/2012 Document Reviewed: 09/18/2012 Elsevier Interactive Patient Education [employer]. I have at given you a prescription for Ativan which can help control your anxiety symptoms.  This is a short-term solution.  Please make an appointment with your primary care physician to follow through on the recommendations of your counselor Please continue seeing your anger management counselor weekly As discussed previously to help your knee discomfort while at work.  I recommend that you wear a support sock

## 2014-12-03 NOTE — ED Notes (Signed)
Pt arrives via EMS with chest pain midsternal, states started after he got into an argument with wife. Non radiating, states he's had episodes like this before. HX anxiety and depression but takes no meds for these. 324 MG ASA given by EMS PTA, no nitro on board.

## 2015-01-19 DIAGNOSIS — G510 Bell's palsy: Secondary | ICD-10-CM

## 2015-01-19 HISTORY — DX: Bell's palsy: G51.0

## 2015-02-13 ENCOUNTER — Emergency Department (HOSPITAL_COMMUNITY): Payer: BLUE CROSS/BLUE SHIELD

## 2015-02-13 ENCOUNTER — Encounter (HOSPITAL_COMMUNITY): Payer: Self-pay | Admitting: *Deleted

## 2015-02-13 ENCOUNTER — Observation Stay (HOSPITAL_COMMUNITY)
Admission: EM | Admit: 2015-02-13 | Discharge: 2015-02-15 | Disposition: A | Discharge status: Home / Self Care | Payer: BLUE CROSS/BLUE SHIELD | Attending: Internal Medicine | Admitting: Internal Medicine

## 2015-02-13 DIAGNOSIS — R200 Anesthesia of skin: Secondary | ICD-10-CM | POA: Diagnosis not present

## 2015-02-13 DIAGNOSIS — R531 Weakness: Secondary | ICD-10-CM | POA: Insufficient documentation

## 2015-02-13 DIAGNOSIS — Z87891 Personal history of nicotine dependence: Secondary | ICD-10-CM | POA: Diagnosis not present

## 2015-02-13 DIAGNOSIS — Z79899 Other long term (current) drug therapy: Secondary | ICD-10-CM | POA: Insufficient documentation

## 2015-02-13 DIAGNOSIS — G510 Bell's palsy: Secondary | ICD-10-CM | POA: Diagnosis not present

## 2015-02-13 DIAGNOSIS — R29810 Facial weakness: Secondary | ICD-10-CM | POA: Diagnosis present

## 2015-02-13 DIAGNOSIS — K219 Gastro-esophageal reflux disease without esophagitis: Secondary | ICD-10-CM | POA: Diagnosis not present

## 2015-02-13 LAB — DIFFERENTIAL
BASOS ABS: 0 10*3/uL (ref 0.0–0.1)
Basophils Relative: 0 %
EOS PCT: 2 %
Eosinophils Absolute: 0.2 10*3/uL (ref 0.0–0.7)
LYMPHS ABS: 2.6 10*3/uL (ref 0.7–4.0)
LYMPHS PCT: 22 %
Monocytes Absolute: 0.8 10*3/uL (ref 0.1–1.0)
Monocytes Relative: 7 %
NEUTROS PCT: 69 %
Neutro Abs: 8.3 10*3/uL — ABNORMAL HIGH (ref 1.7–7.7)

## 2015-02-13 LAB — COMPREHENSIVE METABOLIC PANEL
ALBUMIN: 4.1 g/dL (ref 3.5–5.0)
ALT: 20 U/L (ref 17–63)
ANION GAP: 9 (ref 5–15)
AST: 27 U/L (ref 15–41)
Alkaline Phosphatase: 56 U/L (ref 38–126)
BUN: 15 mg/dL (ref 6–20)
CHLORIDE: 108 mmol/L (ref 101–111)
CO2: 23 mmol/L (ref 22–32)
Calcium: 9 mg/dL (ref 8.9–10.3)
Creatinine, Ser: 1.04 mg/dL (ref 0.61–1.24)
GFR calc Af Amer: >60 mL/min (ref >60)
GFR calc non Af Amer: >60 mL/min (ref >60)
GLUCOSE: 100 mg/dL — AB (ref 65–99)
POTASSIUM: 3.7 mmol/L (ref 3.5–5.1)
Sodium: 140 mmol/L (ref 135–145)
Total Bilirubin: 0.4 mg/dL (ref 0.3–1.2)
Total Protein: 7.3 g/dL (ref 6.5–8.1)

## 2015-02-13 LAB — I-STAT CHEM 8, ED
BUN: 16 mg/dL (ref 6–20)
CHLORIDE: 105 mmol/L (ref 101–111)
Calcium, Ion: 1.14 mmol/L (ref 1.12–1.23)
Creatinine, Ser: 1 mg/dL (ref 0.61–1.24)
Glucose, Bld: 94 mg/dL (ref 65–99)
HCT: 47 % (ref 39.0–52.0)
HEMOGLOBIN: 16 g/dL (ref 13.0–17.0)
POTASSIUM: 3.7 mmol/L (ref 3.5–5.1)
SODIUM: 141 mmol/L (ref 135–145)
TCO2: 24 mmol/L (ref 0–100)

## 2015-02-13 LAB — CBC
HCT: 43.2 % (ref 39.0–52.0)
HEMOGLOBIN: 14.6 g/dL (ref 13.0–17.0)
MCH: 29 pg (ref 26.0–34.0)
MCHC: 33.8 g/dL (ref 30.0–36.0)
MCV: 85.7 fL (ref 78.0–100.0)
Platelets: 244 10*3/uL (ref 150–400)
RBC: 5.04 MIL/uL (ref 4.22–5.81)
RDW: 13.2 % (ref 11.5–15.5)
WBC: 11.9 10*3/uL — ABNORMAL HIGH (ref 4.0–10.5)

## 2015-02-13 LAB — PROTIME-INR
INR: 0.94 (ref 0.00–1.49)
Prothrombin Time: 12.8 seconds (ref 11.6–15.2)

## 2015-02-13 LAB — I-STAT TROPONIN, ED: Troponin i, poc: 0 ng/mL (ref 0.00–0.08)

## 2015-02-13 LAB — APTT: aPTT: 37 seconds (ref 24–37)

## 2015-02-13 LAB — CBG MONITORING, ED: GLUCOSE-CAPILLARY: 100 mg/dL — AB (ref 65–99)

## 2015-02-13 NOTE — ED Notes (Signed)
Dr. Madilyn Hookees didn't want to call a stroke after assessing patient.

## 2015-02-13 NOTE — ED Notes (Signed)
Dr. Rees at bedside  

## 2015-02-13 NOTE — ED Notes (Signed)
[facility] is at bedside.  

## 2015-02-13 NOTE — ED Notes (Signed)
Neurologist at bedside. 

## 2015-02-13 NOTE — ED Provider Notes (Signed)
CSN: [id number]     Arrival date & time 02/13/15  2020 History   First MD Initiated Contact with Patient 02/13/15 2042     Chief Complaint  Patient presents with  . Stroke Symptoms     The history is provided by the patient. No language interpreter was used.  Dollene PrimroseManuel Bhakta is a 29 y.o. male who presents to the Emergency Department complaining of left face and arm weakness. He states that yesterday he developed left sided facial weakness. At noon today he developed left upper extremity weakness and numbness as well. He denies any chest pain, shortness of breath, abdominal pain. No history of prior similar symptoms. No history of hypertension, diabetes, hyperlipidemia. Symptoms are moderate and constant in nature.   Past Medical History  Diagnosis Date  . Reflux    Past Surgical History  Procedure Laterality Date  . Wrist surgery Left 2010  . Finger surgery Right 2002    R small finger   Family History  Problem Relation Age of Onset  . Hyperlipidemia Mother    Social History  Substance Use Topics  . Smoking status: Former Smoker    Types: Cigarettes  . Smokeless tobacco: None  . Alcohol Use: Yes     Comment: occasional    Review of Systems  All other systems reviewed and are negative.     Allergies  Review of patient's allergies indicates no known allergies.  Home Medications   Prior to Admission medications   Medication Sig Start Date End Date Taking? Authorizing Provider  LORazepam (ATIVAN) 1 MG tablet Take 1 tablet (1 mg total) by mouth 3 (three) times daily as needed for anxiety. 12/03/14   Earley FavorGail Schulz, NP   BP 128/80 mmHg  Pulse 70  Temp(Src) 97.8 F (36.6 C) (Oral)  Resp 16  Ht 6\' 3"  (1.905 m)  Wt 285 lb (129.275 kg)  BMI 35.62 kg/m2  SpO2 98% Physical Exam  Constitutional: He is oriented to person, place, and time. He appears well-developed and well-nourished.  HENT:  Head: Normocephalic and atraumatic.  Cardiovascular: Normal rate and regular  rhythm.   No murmur heard. Pulmonary/Chest: Effort normal and breath sounds normal. No respiratory distress.  Abdominal: Soft. There is no tenderness. There is no rebound and no guarding.  Musculoskeletal: He exhibits no edema or tenderness.  Neurological: He is alert and oriented to person, place, and time.  Weakness of the left upper and lower facial muscles. Decreased sensation to light touch over the left lower face. There is 4+ out of 5 strength in the left upper extremity with subjective decreased sensation on light touch in the left upper extremity. No pronator drift.  Skin: Skin is warm and dry.  Psychiatric: He has a normal mood and affect. His behavior is normal.  Nursing note and vitals reviewed.   ED Course  Procedures (including critical care time) Labs Review Labs Reviewed  CBC - Abnormal; Notable for the following:    WBC 11.9 (*)    All other components within normal limits  DIFFERENTIAL - Abnormal; Notable for the following:    Neutro Abs 8.3 (*)    All other components within normal limits  PROTIME-INR  APTT  COMPREHENSIVE METABOLIC PANEL  I-STAT TROPOININ, ED  I-STAT CHEM 8, ED  CBG MONITORING, ED    Imaging Review Ct Head Wo Contrast  02/13/2015  CLINICAL DATA:  Left arm weakness.  Facial droop.  Code stroke. EXAM: CT HEAD WITHOUT CONTRAST TECHNIQUE: Contiguous axial images were obtained  from the base of the skull through the vertex without intravenous contrast. COMPARISON:  CT head 09/06/2006 FINDINGS: Ventricle size is normal. Negative for acute or chronic infarction. Negative for hemorrhage or fluid collection. Negative for mass or edema. No shift of the midline structures. Calvarium is intact. IMPRESSION: Normal Critical Value/emergent results were called by telephone at the time of interpretation on 02/13/2015 at 9:05 pm to Dr. Tilden Fossa , who verbally acknowledged these results. Electronically Signed   By: Marlan Palau M.D.   On: 02/13/2015 21:05    I have personally reviewed and evaluated these images and lab results as part of my medical decision-making.   EKG Interpretation   Date/Time:  Monday February 13 2015 20:30:51 EST Ventricular Rate:  84 PR Interval:  145 QRS Duration: 97 QT Interval:  366 QTC Calculation: 433 R Axis:   98 Text Interpretation:  Sinus rhythm Inferior infarct, age indeterminate  Lateral leads are also involved Confirmed by Lincoln Brigham [phone]) on  02/13/2015 8:57:54 PM      MDM   Final diagnoses:  Weakness    Patient here for evaluation of left-sided weakness. The patient is not a TPA candidate due to the duration of his symptoms. Examination of presentation it is mixed in unclear clinical picture. Facial exam is concerning for Bell's palsy but this does not explain his extremity weakness. MRI is not currently available at this hospital. Plan to transfer to [facility] for MRI, admission to the hospital service for further evaluation of this weakness.  Tilden Fossa, MD 02/14/15 [phone]

## 2015-02-13 NOTE — ED Notes (Signed)
Hospitalist at bedside 

## 2015-02-13 NOTE — ED Notes (Signed)
Pt returned from CT °

## 2015-02-13 NOTE — ED Notes (Addendum)
Pt states that he had periodic facial numbness to the left side yesterday; pt with left sided facial droop and unable to close left eye; pt states that those symptoms have persisted; pt now complains of left arm numbness and weakness that began this evening around 8pm; pt with weakness to left arm with no drift; pt also complain of decreased sensation to left arm; pt also c/o uncontrolled shaking to his head that began this evening at 8pm as well; pt states that his arm feels like it is asleep; pt also c/o lower mid chest pain described as "needles poking me" and pain in hi jaw that is aching

## 2015-02-13 NOTE — H&P (Signed)
[facility] History and Physical  Richard Ramirez ZOX:[id number] DOB: October 18, 1985 DOA: 02/13/2015  Referring physician: Dr.Rees. PCP: Pcp Not In System  Specialists: None.  Chief Complaint: Left facial palsy and left upper extremity numbness.  HPI: Richard Ramirez is a 29 y.o. male with no significant past medical history started experiencing left facial palsy involving the upper and lower half of the face since yesterday morning after he woke up from sleep. He also has some pain around the back of the ear. No skin eruptions. Today after work at around 8 PM patient started developing numbness of the left upper extremity. Patient presented to the ER. On exam patient does have left facial palsy involving the upper and lower half of the face. On exam patient is able to move all extremities 5 x 5. Perla positive. Tongue is midline. Denies any difficulty swallowing or speaking. On-call neurologist Dr. Amada Jupiter was consulted and patient will be admitted for further management.   Review of Systems: As presented in the history of presenting illness, rest negative.  Past Medical History  Diagnosis Date  . Reflux    Past Surgical History  Procedure Laterality Date  . Wrist surgery Left 2010  . Finger surgery Right 2002    R small finger   Social History:  reports that he has quit smoking. His smoking use included Cigarettes. He does not have any smokeless tobacco history on file. He reports that he drinks alcohol. He reports that he does not use illicit drugs. Where does patient live at home. Can patient participate in ADLs? Yes.  No Known Allergies  Family History:  Family History  Problem Relation Age of Onset  . Hyperlipidemia Mother       Prior to Admission medications   Medication Sig Start Date End Date Taking? Authorizing Provider  LORazepam (ATIVAN) 1 MG tablet Take 1 tablet (1 mg total) by mouth 3 (three) times daily as needed for anxiety. Patient not taking: Reported  on 02/13/2015 12/03/14   Earley Favor, NP    Physical Exam: Filed Vitals:   02/13/15 2315 02/13/15 2330 02/13/15 2345 02/13/15 2345  BP: 109/67 120/79 108/90   Pulse: 70 75 77   Temp:    97.8 F (36.6 C)  TempSrc:      Resp: Height:      Weight:      SpO2: 100% 100% 100%      General:  Moderately built and nourished.  Eyes: Anicteric no pallor.  ENT: No discharge from the ears eyes nose and mouth.  Neck: No mass felt. No neck rigidity.  Cardiovascular: S1 and S2 heard.  Respiratory: No rhonchi or crepitations.  Abdomen: Soft nontender bowel sounds present.  Skin: No rash.  Musculoskeletal: No edema.  Psychiatric: Appears normal.  Neurologic: Alert awake oriented to time place and person. Moves all extremities 5 x 5. Left facial palsy involving both upper and lower of the face. Tongue is midline. Perla positive.  Labs on Admission:  Basic Metabolic Panel:  Recent Labs Lab 02/13/15 2055 02/13/15 2101  NA 140 141  K 3.7 3.7  CL 108 105  CO2 23  --   GLUCOSE 100* 94  BUN 15 16  CREATININE 1.04 1.00  CALCIUM 9.0  --    Liver Function Tests:  Recent Labs Lab 02/13/15 2055  AST 27  ALT 20  ALKPHOS 56  BILITOT 0.4  PROT 7.3  ALBUMIN 4.1   No results for input(s): LIPASE, AMYLASE in  the last 168 hours. No results for input(s): AMMONIA in the last 168 hours. CBC:  Recent Labs Lab 02/13/15 2055 02/13/15 2101  WBC 11.9*  --   NEUTROABS 8.3*  --   HGB 14.6 16.0  HCT 43.2 47.0  MCV 85.7  --   PLT 244  --    Cardiac Enzymes: No results for input(s): CKTOTAL, CKMB, CKMBINDEX, TROPONINI in the last 168 hours.  BNP (last 3 results) No results for input(s): BNP in the last 8760 hours.  ProBNP (last 3 results) No results for input(s): PROBNP in the last 8760 hours.  CBG:  Recent Labs Lab 02/13/15 2112  GLUCAP 100*    Radiological Exams on Admission: Ct Head Wo Contrast  02/13/2015  CLINICAL DATA:  Left arm weakness.  Facial  droop.  Code stroke. EXAM: CT HEAD WITHOUT CONTRAST TECHNIQUE: Contiguous axial images were obtained from the base of the skull through the vertex without intravenous contrast. COMPARISON:  CT head 09/06/2006 FINDINGS: Ventricle size is normal. Negative for acute or chronic infarction. Negative for hemorrhage or fluid collection. Negative for mass or edema. No shift of the midline structures. Calvarium is intact. IMPRESSION: Normal Critical Value/emergent results were called by telephone at the time of interpretation on 02/13/2015 at 9:05 pm to Dr. Tilden FossaELIZABETH REES , who verbally acknowledged these results. Electronically Signed   By: Marlan Palauharles  Clark M.D.   On: 02/13/2015 21:05     Assessment/Plan Principal Problem:   Facial palsy   1. Left facial palsy - features are concerning for Bell's palsy but since patient has left upper extremity numbness at this time I have discussed with on-call neurologist who has recommended MRI brain with and without contrast to rule out other causes. If patient's MRI brain is negative for stroke or other causes and patient need to be on prednisone oral dose for at least 7 days for Bell's palsy. 2. Tobacco abuse - tobacco cessation counseling requested.  [facility]. Dr. Julian ReilGardner will be the accepting physician. Patient is agreeable to transfer.  DVT Prophylaxis SCDs.  Code Status: Full code.  Family Communication: Patient's family at the bedside.  Disposition Plan: Admit for observation.    Gabrial Domine N. [facility] Pager [phone].  If 7PM-7AM, please contact night-coverage [email or web address] Password [email or web address] 02/13/2015, 11:57 PM

## 2015-02-13 NOTE — ED Notes (Signed)
MRI can not be performed tonight [facility]. Dr Madilyn Hookees is arranging for patient to be [facility]. Pt and pt's family member are aware of plan of care.

## 2015-02-14 ENCOUNTER — Observation Stay (HOSPITAL_COMMUNITY): Payer: BLUE CROSS/BLUE SHIELD

## 2015-02-14 DIAGNOSIS — G510 Bell's palsy: Secondary | ICD-10-CM | POA: Diagnosis not present

## 2015-02-14 LAB — RAPID URINE DRUG SCREEN, HOSP PERFORMED
AMPHETAMINES: NOT DETECTED
BARBITURATES: NOT DETECTED
BENZODIAZEPINES: NOT DETECTED
COCAINE: NOT DETECTED
Opiates: NOT DETECTED
Tetrahydrocannabinol: NOT DETECTED

## 2015-02-14 LAB — COMPREHENSIVE METABOLIC PANEL
ALBUMIN: 3.5 g/dL (ref 3.5–5.0)
ALK PHOS: 45 U/L (ref 38–126)
ALT: 17 U/L (ref 17–63)
ANION GAP: 7 (ref 5–15)
AST: 23 U/L (ref 15–41)
BUN: 9 mg/dL (ref 6–20)
CALCIUM: 8.8 mg/dL — AB (ref 8.9–10.3)
CO2: 23 mmol/L (ref 22–32)
Chloride: 109 mmol/L (ref 101–111)
Creatinine, Ser: 0.85 mg/dL (ref 0.61–1.24)
GFR calc non Af Amer: >60 mL/min (ref >60)
GLUCOSE: 94 mg/dL (ref 65–99)
POTASSIUM: 3.8 mmol/L (ref 3.5–5.1)
SODIUM: 139 mmol/L (ref 135–145)
TOTAL PROTEIN: 6.4 g/dL — AB (ref 6.5–8.1)
Total Bilirubin: 0.7 mg/dL (ref 0.3–1.2)

## 2015-02-14 MED ORDER — TRAMADOL HCL 50 MG PO TABS
50.0000 mg | ORAL_TABLET | Freq: Four times a day (QID) | ORAL | Status: DC | PRN
  Administered 2015-02-14 – 2015-02-15 (×5): 50 mg via ORAL
  Filled 2015-02-14 (×5): qty 1

## 2015-02-14 MED ORDER — ACETAMINOPHEN 325 MG PO TABS
650.0000 mg | ORAL_TABLET | Freq: Four times a day (QID) | ORAL | Status: DC | PRN
  Administered 2015-02-14: 650 mg via ORAL
  Filled 2015-02-14: qty 2

## 2015-02-14 MED ORDER — GADOBENATE DIMEGLUMINE 529 MG/ML IV SOLN
20.0000 mL | Freq: Once | INTRAVENOUS | Status: AC
  Administered 2015-02-14: 20 mL via INTRAVENOUS

## 2015-02-14 MED ORDER — SENNOSIDES-DOCUSATE SODIUM 8.6-50 MG PO TABS: 1.0000 | ORAL_TABLET | Freq: Every evening | ORAL | Status: DC | PRN

## 2015-02-14 MED ORDER — SODIUM CHLORIDE 0.9 % IV SOLN: 200.0000 mg | Freq: Two times a day (BID) | INTRAVENOUS | Status: DC

## 2015-02-14 MED ORDER — SODIUM CHLORIDE 0.9 % IV SOLN
INTRAVENOUS | Status: AC
  Administered 2015-02-14: 01:00:00 via INTRAVENOUS

## 2015-02-14 NOTE — Care Management Note (Signed)
Case Management Note  Patient Details  Name: Richard Ramirez MRN: [id number] Date of Birth: 17-Jun-1985  Subjective/Objective:   Patient admitted with facial palsy. Patient is from home with his family.                  Action/Plan: Awaiting PT/OT recommendations. CM will continue to follow for discharge needs.   Expected Discharge Date:                  Expected Discharge Plan:     In-House Referral:     Discharge planning Services     Post Acute Care Choice:    Choice offered to:     DME Arranged:    DME Agency:     HH Arranged:    HH Agency:     Status of Service:  In process, will continue to follow  Medicare Important Message Given:    Date Medicare IM Given:    Medicare IM give by:    Date Additional Medicare IM Given:    Additional Medicare Important Message give by:     If discussed at Long Length of Stay Meetings, dates discussed:    Additional Comments:  Kermit BaloKelli F Lache Dagher, RN 02/14/2015, 4:10 PM

## 2015-02-14 NOTE — ED Notes (Signed)
[facility] is [facility].

## 2015-02-14 NOTE — Progress Notes (Addendum)
Pt arrived to room 5M17 via [facility].  Pt is alert and oriented.  Pt has left facial droop along with left facial numbness and left arm numbness/tingling.  Safety measures in place.  Pt states that he has a metal rod in his left arm from a surgery, MD notified since MRI is ordered.  Will continue to monitor.  Estanislado EmmsAshley Schwarz, RN

## 2015-02-14 NOTE — Consult Note (Signed)
Neurology Consultation Reason for Consult: Facial weakness Referring Physician: Madilyn Hookees, E  CC: Arm numbness  History is obtained from:patient   HPI: Richard PrimroseManuel Ramirez is a 29 y.o. male who was in his normal normal state of health until 12/25 at which point he began noticing some left-sided facial weakness. Then 12/26, he began noticing that his left arm seemed numb. This was relatively abrupt onset at 8 PM. Since then, he has noticed are slightly improved but he still has persistent numbness of the left arm and face.   LKW: 12/25 tpa given?: no, delay in arrival   ROS: A 14 point ROS was performed and is negative except as noted in the HPI.   Past Medical History  Diagnosis Date  . Reflux      Family History  Problem Relation Age of Onset  . Hyperlipidemia Mother      Social History:  reports that he has quit smoking. His smoking use included Cigarettes. He does not have any smokeless tobacco history on file. He reports that he drinks alcohol. He reports that he does not use illicit drugs.   Exam: Current vital signs: BP 127/87 mmHg  Pulse 80  Temp(Src) 98.1 F (36.7 C) (Oral)  Resp 18  Ht 6\' 3"  (1.905 m)  Wt 130.953 kg (288 lb 11.2 oz)  BMI 36.08 kg/m2  SpO2 99% Vital signs in last 24 hours: Temp:  [97.8 F (36.6 C)-98.3 F (36.8 C)] 98.1 F (36.7 C) (12/27 0027) Pulse Rate:  [69-81] 80 (12/27 0027) Resp:  [14-18] 18 (12/27 0027) BP: (106-128)/(43-90) 127/87 mmHg (12/27 0027) SpO2:  [98 %-100 %] 99 % (12/27 0027) Weight:  [129.275 kg (285 lb)-130.953 kg (288 lb 11.2 oz)] 130.953 kg (288 lb 11.2 oz) (12/27 0027)   Physical Exam  Constitutional: Appears well-developed and well-nourished.  Psych: Affect appropriate to situation Eyes: No scleral injection HENT: No OP obstrucion Head: Normocephalic.  Cardiovascular: Normal rate and regular rhythm.  Respiratory: Effort normal and breath sounds normal to anterior ascultation GI: Soft.  No distension. There is no  tenderness.  Skin: WDI  Neuro: Mental Status: Patient is awake, alert, oriented to person, place, month, year, and situation. Patient is able to give a clear and coherent history. No signs of aphasia or neglect Cranial Nerves: II: Visual Fields are full. Pupils are equal, round, and reactive to light.   III,IV, VI: EOMI without ptosis or diploplia.  V: Facial sensation is decreased on the left side including to vibration  VII: Facial movement is  notable for left upper and lower facial weakness  VIII: hearing is intact to voice X: Uvula elevates symmetrically XI: Shoulder shrug is symmetric. XII: tongue is midline without atrophy or fasciculations.  Motor: Tone is normal. Bulk is normal. 5/5 strength was present in all four extremities.  Sensory: Sensation is Mildly diminished in the left arm  Cerebellar: FNF intact     I have reviewed labs in epic and the results pertinent to this consultation are:  mild leukocytosis I have reviewed the images obtained: CT head-negative  Impression:29 year old male with left facial weakness most consistent with peripheral etiology, though the presence of sensory symptoms as well does mean that further imaging is needed. One possibility would be functional symptoms superimposed upon a very real peripheral seventh nerve palsy. Other possibility would be multifocal disease such as demyelinating disease or ischemia  Recommendation: 1) MRI brain 2) further workup pending results of the above tests  Ritta SlotMcNeill Kaarin Pardy, MD [facility] [phone]  If 7pm- 7am, please page neurology on call as listed in [place].

## 2015-02-14 NOTE — Evaluation (Signed)
Physical Therapy Evaluation Patient Details Name: Richard PrimroseManuel Ramirez MRN: [id number] DOB: 1985/10/10 Today's Date: 02/14/2015   History of Present Illness  29 y.o. male admitted with L facial droop, LUE weakness, decreased sensation on L side. MRI (-) for acute infarct. ? Bell's Palsy     Clinical Impression  Pt admitted with above symptoms. Patient with very inconsistent strength throughout session and very different from earlier OT session (walking no device with supervision). In testing in sitting, BOTH legs with 2+/5 weakness. Functionally he demonstrated at least 4/5 strength bilaterally. (Pt was able to stand from bed without physical assist, able to SLOWLY lower himself to sitting, and lift legs back up onto bed independently). Pt currently with functional limitations demonstrating the deficits listed below (see PT Problem List). With inconsistencies, anticipate pt can make quick progress. Pt will benefit from skilled PT to increase their independence and safety with mobility to allow discharge to the venue listed below.       Follow Up Recommendations Outpatient PT;Supervision for mobility/OOB    Equipment Recommendations  Other (comment) (TBD)    Recommendations for Other Services       Precautions / Restrictions Precautions Precautions: Fall Restrictions Weight Bearing Restrictions: No      Mobility  Bed Mobility Overal bed mobility: Modified Independent             General bed mobility comments: HOB flat, very effortful to move legs over EOB and bring torso to sitting; able to return to supine modified independent  Transfers Overall transfer level: Needs assistance Equipment used: Rolling walker (2 wheeled) Transfers: Sit to/from Stand Sit to Stand: Min guard         General transfer comment: minguard with pt able to push off bed, transition hands to RW and slowly descend/return to EOB with no buckling  Ambulation/Gait             General Gait  Details: not tested due to pt's report that legs will buckle if he tries  Stairs            Wheelchair Mobility    Modified Rankin (Stroke Patients Only)       Balance Overall balance assessment: Needs assistance   Sitting balance-Leahy Scale: Normal     Standing balance support: Bilateral upper extremity supported Standing balance-Leahy Scale: Poor Standing balance comment: static standing required bil UE support (for safety--pt with no signs of weakness/buckling)                             Pertinent Vitals/Pain Pain Assessment: No/denies pain    Home Living Family/patient expects to be discharged to:: Private residence Living Arrangements: Parent Available Help at Discharge: Family;Available PRN/intermittently Type of Home: House Home Access: Stairs to enter Entrance Stairs-Rails: Right;Left;Can reach both Entrance Stairs-Number of Steps: 2 Home Layout: One level Home Equipment: Hand held shower head      Prior Function Level of Independence: Independent         Comments: Works as a Advertising copywritermachine operator     Hand Dominance   Dominant Hand: Right    Extremity/Trunk Assessment              Lower Extremity Assessment: RLE deficits/detail;LLE deficits/detail RLE Deficits / Details: inconsistent weakness (sitting at EOB hip flex 2+, knee extension 2+, toe extension 3-; however able to sit to stand with minguard assist; stand with RW x 60 seconds, return to sitting slow/controlled; pt lifted legs  onto bed without assistance and lifted off bed 12 inches for donning PAS hose LLE Deficits / Details: inconsistent weakness (sitting at EOB hip flex 2+, knee extension 2+, toe extension 3-; however able to sit to stand with minguard assist; stand with RW x 60 seconds, return to sitting slow/controlled; pt lifted legs onto bed without assistance and lifted off bed 12 inches for donning PAS hose  Cervical / Trunk Assessment: Normal  Communication    Communication: No difficulties;Expressive difficulties (slow initially, by end of session, extremely delayed)  Cognition Arousal/Alertness: Awake/alert Behavior During Therapy: Flat affect Overall Cognitive Status: Within Functional Limits for tasks assessed                      General Comments General comments (skin integrity, edema, etc.): "Wife" and male friend present in room    Exercises        Assessment/Plan    PT Assessment Patient needs continued PT services  PT Diagnosis Generalized weakness   PT Problem List Decreased strength;Decreased balance;Decreased mobility;Decreased knowledge of use of DME;Decreased safety awareness;Impaired sensation  PT Treatment Interventions DME instruction;Gait training;Stair training;Functional mobility training;Therapeutic activities;Therapeutic exercise;Balance training;Patient/family education;Cognitive remediation   PT Goals (Current goals can be found in the Care Plan section) Acute Rehab PT Goals Patient Stated Goal: to get better PT Goal Formulation: With patient Time For Goal Achievement: 02/21/15 Potential to Achieve Goals: Good    Frequency Min 3X/week   Barriers to discharge        Co-evaluation               End of Session Equipment Utilized During Treatment: Gait belt;Other (comment) (Lt eye covered with gauze) Activity Tolerance: Patient tolerated treatment well Patient left: in bed;with call bell/phone within reach;with bed alarm set;with family/visitor present Nurse Communication: Mobility status;Other (comment) (inconsistencies with testing; symptoms now also on Rt)    Functional Assessment Tool Used: clinical judgement Functional Limitation: Mobility: Walking and moving around Mobility: Walking and Moving Around Current Status (Y7829): At least 80 percent but less than 100 percent impaired, limited or restricted Mobility: Walking and Moving Around Goal Status [phone]): 0 percent impaired, limited  or restricted    Time: 1322-1343 PT Time Calculation (min) (ACUTE ONLY): 21 min   Charges:   PT Evaluation $Initial PT Evaluation Tier I: 1 Procedure     PT G Codes:   PT G-Codes **NOT FOR INPATIENT CLASS** Functional Assessment Tool Used: clinical judgement Functional Limitation: Mobility: Walking and moving around Mobility: Walking and Moving Around Current Status ([id number]): At least 80 percent but less than 100 percent impaired, limited or restricted Mobility: Walking and Moving Around Goal Status [phone]): 0 percent impaired, limited or restricted    Rane Blitch 02/14/2015, 2:05 PM Pager [phone]

## 2015-02-14 NOTE — Progress Notes (Signed)
[facility] PROGRESS NOTE  Richard PrimroseManuel Ramirez ZOX:[id number] DOB: [birth year] DOA: 02/13/2015  PCP[facility]  Brief HPI: 29 year old male with no significant past medical history presented with left facial numbness and drooping. He also had numbness involving his left upper extremity. Patient was hospitalized for further management.  Past medical history:  Past Medical History  Diagnosis Date  . Reflux     Consultants: Neurology  Procedures: None  Antibiotics: None  Subjective: Patient reports no improvement. Continues to have numbness in the left side of his pain is. Some numbness in his left arm as well. Denies any chest pain, shortness of breath. No headaches. No neck pain.  Objective: Vital Signs  Filed Vitals:   02/14/15 0400 02/14/15 0600 02/14/15 0800 02/14/15 1000  BP: 123/86 107/68 131/92 112/62  Pulse: 69 60 77   Temp: 97.9 F (36.6 C) 98.1 F (36.7 C)    TempSrc: Oral Oral    Resp: 16 16    Height:      Weight:      SpO2: 99% 99% 99% 99%    Intake/Output Summary (Last 24 hours) at 02/14/15 1341 Last data filed at 02/14/15 0200  Gross per 24 hour  Intake      0 ml  Output    200 ml  Net   -200 ml   Filed Weights   02/13/15 2032 02/14/15 0027  Weight: 129.275 kg (285 lb) 130.953 kg (288 lb 11.2 oz)    General appearance: alert, cooperative, appears stated age and no distress Resp: clear to auscultation bilaterally Cardio: regular rate and rhythm, S1, S2 normal, no murmur, click, rub or gallop GI: soft, non-tender; bowel sounds normal; no masses,  no organomegaly Extremities: Alert, oriented 3. Drooping of the left side of the face is noted. Left facial palsy is present. Decreased sensation. Strength is equal bilateral upper and lower extremities. No pronator.  Lab Results:  Basic Metabolic Panel:  Recent Labs Lab 02/13/15 2055 02/13/15 2101 02/14/15 0736  NA 140 141 139  K 3.7 3.7 3.8  CL 108  105 109  CO2 23  --  23  GLUCOSE 100* 94 94  BUN 15 16 9   CREATININE 1.04 1.00 0.85  CALCIUM 9.0  --  8.8*   Liver Function Tests:  Recent Labs Lab 02/13/15 2055 02/14/15 0736  AST 27 23  ALT 20 17  ALKPHOS 56 45  BILITOT 0.4 0.7  PROT 7.3 6.4*  ALBUMIN 4.1 3.5   CBC:  Recent Labs Lab 02/13/15 2055 02/13/15 2101  WBC 11.9*  --   NEUTROABS 8.3*  --   HGB 14.6 16.0  HCT 43.2 47.0  MCV 85.7  --   PLT 244  --     CBG:  Recent Labs Lab 02/13/15 2112  GLUCAP 100*    No results found for this or any previous visit (from the past 240 hour(s)).    Studies/Results: Ct Head Wo Contrast  02/13/2015  CLINICAL DATA:  Left arm weakness.  Facial droop.  Code stroke. EXAM: CT HEAD WITHOUT CONTRAST TECHNIQUE: Contiguous axial images were obtained from the base of the skull through the vertex without intravenous contrast. COMPARISON:  CT head 09/06/2006 FINDINGS: Ventricle size is normal. Negative for acute or chronic infarction. Negative for hemorrhage or fluid collection. Negative for mass or edema. No shift of the midline structures. Calvarium is intact. IMPRESSION: Normal Critical Value/emergent results were called by telephone at the time of interpretation on 02/13/2015  at 9:05 pm to Dr. Tilden Fossa , who verbally acknowledged these results. Electronically Signed   By: Marlan Palau M.D.   On: 02/13/2015 21:05   Mr Richard Ramirez Contrast  02/14/2015  CLINICAL DATA:  LEFT face and arm numbness, LEFT facial droop. EXAM: MRI HEAD WITHOUT AND WITH CONTRAST TECHNIQUE: Multiplanar, multiecho pulse sequences of the brain and surrounding structures were obtained without and with intravenous contrast. CONTRAST:  20mL MULTIHANCE GADOBENATE DIMEGLUMINE 529 MG/ML IV SOLN COMPARISON:  CT head February 13, 2015 FINDINGS: The ventricles and sulci are normal for patient's age. No abnormal parenchymal signal, mass lesions, mass effect. No abnormal parenchymal enhancement. No reduced diffusion  to suggest acute ischemia. No susceptibility artifact to suggest hemorrhage. No abnormal extra-axial fluid collections. No extra-axial masses nor leptomeningeal enhancement. Normal major intracranial vascular flow voids seen at the skull base. Ocular globes and orbital contents are unremarkable though not tailored for evaluation. No suspicious calvarial bone marrow signal. No abnormal sellar expansion. Craniocervical junction maintained. Visualized paranasal sinuses and mastoid air cells are well-aerated. IMPRESSION: Normal MRI of the brain with and without contrast. Electronically Signed   By: Awilda Metro M.D.   On: 02/14/2015 05:36    Medications:  Scheduled:  Continuous: . sodium chloride 75 mL/hr at 02/14/15 0056   JWJ:[id number], senna-docusate, traMADol  Assessment/Plan:  Principal Problem:   Facial palsy    Left facial palsy Patient being followed by neurology. MRI brain is negative for acute stroke. Possible Bell's palsy. However, wouldn't explain the left arm symptoms. Await further neurology input. PT and OT evaluation.   DVT Prophylaxis: SCDs    Code Status: Full code  Family Communication: Discussed with the patient  Disposition Plan: Possible discharge tomorrow. Await further neurology input.     [facility]  [facility] Pager [phone] 02/14/2015, 1:41 PM  If 7PM-7AM, please contact night-coverage at [email or web address], password [facility]

## 2015-02-14 NOTE — Progress Notes (Signed)
Occupational Therapy Evaluation Patient Details Name: Dollene PrimroseManuel Kingsley MRN: [id number] DOB: 1985-03-23 Today's Date: 02/14/2015    History of Present Illness 29 y.o. male admitted with L facial droop, LUE weakness, decreased sensation on L side. MRI (-) for acute infarct. Likely Bell's Palsy.   Clinical Impression   PTA, pt was independent with ADLs and mobility. Pt currently presents with mild LUE weakness, decreased sensation in LUE and L side of face, and L eye visual deficits. Pt required supervision for mobility during all ADLs due to visual deficits and unsteady gait. Pt will benefit from acute skilled OT to increase independence and safety with ADLs and mobility to allow safe discharge home.     Follow Up Recommendations  No OT follow up    Equipment Recommendations  None recommended by OT    Recommendations for Other Services       Precautions / Restrictions Precautions Precautions: Fall Restrictions Weight Bearing Restrictions: No      Mobility Bed Mobility Overal bed mobility: Modified Independent             General bed mobility comments: HOB flat, no use of bedrails to simulate home environment  Transfers Overall transfer level: Needs assistance Equipment used: None   Sit to Stand: Supervision         General transfer comment: Supervision for safety due to unsteadiness and vision deficits    Balance     Sitting balance-Leahy Scale: Normal       Standing balance-Leahy Scale: Fair                              ADL Overall ADL's : Needs assistance/impaired     Grooming: Wash/dry hands;Wash/dry face;Oral care;Supervision/safety;Standing   Upper Body Bathing: Supervision/ safety;Standing   Lower Body Bathing: Supervison/ safety;Sit to/from stand   Upper Body Dressing : Supervision/safety;Sitting   Lower Body Dressing: Supervision/safety;Sit to/from stand   Toilet Transfer: Supervision/safety;Ambulation;Regular Toilet    Toileting- ArchitectClothing Manipulation and Hygiene: Supervision/safety;Sit to/from stand       Functional mobility during ADLs: Supervision/safety General ADL Comments: Supervision for safety due to unsteady gait and impaired vision.     Vision Vision Assessment?: Yes Eye Alignment: Impaired (comment) Alignment/Gaze Preference: Within Defined Limits Tracking/Visual Pursuits: Left eye does not track medially Saccades: Impaired - to be further tested in functional context Convergence: Impaired - to be further tested in functional context Visual Fields: Impaired-to be further tested in functional context   Perception     Praxis      Pertinent Vitals/Pain       Hand Dominance Right   Extremity/Trunk Assessment Upper Extremity Assessment LUE Deficits / Details: Numbness in hand, slight weakness in grip strength LUE Sensation: decreased light touch       Cervical / Trunk Assessment Cervical / Trunk Assessment: Normal   Communication Communication Communication: No difficulties   Cognition Arousal/Alertness: Awake/alert Behavior During Therapy: WFL for tasks assessed/performed Overall Cognitive Status: Within Functional Limits for tasks assessed                     General Comments       Exercises       Shoulder Instructions      Home Living Family/patient expects to be discharged to:: Private residence Living Arrangements: Parent Available Help at Discharge: Family;Available PRN/intermittently Type of Home: House Home Access: Stairs to enter Entergy CorporationEntrance Stairs-Number of Steps: 2 Entrance Stairs-Rails: Right;Left;Can reach both  Home Layout: One level     Bathroom Shower/Tub: Walk-in shower;Door   [employer] Toilet: Standard     Home Equipment: Hand held shower head          Prior Functioning/Environment Level of Independence: Independent        Comments: Works as a Restaurant manager, fast food Diagnosis: Disturbance of vision;Acute pain   OT  Problem List: Impaired vision/perception;Decreased safety awareness;Impaired sensation;Obesity;Pain   OT Treatment/Interventions: Therapeutic activities;Balance training;Patient/family education;Visual/perceptual remediation/compensation;Therapeutic exercise    OT Goals(Current goals can be found in the care plan section) Acute Rehab OT Goals Patient Stated Goal: to get better OT Goal Formulation: With patient Time For Goal Achievement: 02/28/15 Potential to Achieve Goals: Good ADL Goals Pt/caregiver will Perform Home Exercise Program: Increased strength;With written HEP provided;Independently (for L eye)  OT Frequency: Min 2X/week   Barriers to D/C:            Co-evaluation              End of Session Equipment Utilized During Treatment: Gait belt Nurse Communication: Mobility status  Activity Tolerance: Patient tolerated treatment well Patient left: in bed;with call bell/phone within reach;with chair alarm set;with family/visitor present   Time:  -    Charges:    G-Codes: OT G-codes **NOT FOR INPATIENT CLASS** Functional Assessment Tool Used: clinical judgement Functional Limitation: Self care Self Care Current Status (V4098): At least 1 percent but less than 20 percent impaired, limited or restricted Self Care Goal Status (J1914): At least 1 percent but less than 20 percent impaired, limited or restricted  Nils Pyle, OTR/L 02/14/2015, 12:39 PM Pager: [phone]

## 2015-02-15 DIAGNOSIS — G510 Bell's palsy: Secondary | ICD-10-CM | POA: Diagnosis not present

## 2015-02-15 MED ORDER — PREDNISONE 20 MG PO TABS
60.0000 mg | ORAL_TABLET | Freq: Every day | ORAL | Status: DC
  Administered 2015-02-15: 60 mg via ORAL
  Filled 2015-02-15: qty 3

## 2015-02-15 MED ORDER — PREDNISONE 20 MG PO TABS: ORAL_TABLET | ORAL | Status: DC

## 2015-02-15 NOTE — Progress Notes (Signed)
Physical Therapy Treatment Patient Details Name: Richard Ramirez MRN: [id number] DOB: May 19, 1985 Today's Date: 02/15/2015    History of Present Illness 29 y.o. male admitted with L facial droop, LUE weakness, decreased sensation on L side. MRI (-) for acute infarct. ? Bell's Palsy     PT Comments    Patient moving much better today. Continues to appear labored and initially reaching for walls, doorframes, railing. By the end he was ambulating modified independent (incr time). Ready to d/c from PT perspective   Follow Up Recommendations  Outpatient PT;Supervision for mobility/OOB     Equipment Recommendations  None recommended by PT    Recommendations for Other Services       Precautions / Restrictions Precautions Precautions: Fall Restrictions Weight Bearing Restrictions: No    Mobility  Bed Mobility Overal bed mobility: Modified Independent             General bed mobility comments: HOB flat, no use of bedrails to simulate home environment. Increased time and effort  Transfers Overall transfer level: Needs assistance Equipment used: None Transfers: Sit to/from Stand Sit to Stand: Modified independent (Device/Increase time)         General transfer comment: reaching for wall, door frame but steady  Ambulation/Gait Ambulation/Gait assistance: Min guard;Modified independent (Device/Increase time) Ambulation Distance (Feet): 150 Feet Assistive device: 1 person hand held assist;None Gait Pattern/deviations: Step-through pattern;Decreased stride length;Wide base of support   Gait velocity interpretation: <1.8 ft/sec, indicative of risk for recurrent falls General Gait Details: very slow and labored; no LOB   Stairs Stairs: Yes Stairs assistance: Modified independent (Device/Increase time) Stair Management: Two rails;Alternating pattern;Forwards Number of Stairs: 2    Wheelchair Mobility    Modified Rankin (Stroke Patients Only)       Balance  Overall balance assessment: Needs assistance Sitting-balance support: No upper extremity supported;Feet supported Sitting balance-Leahy Scale: Normal     Standing balance support: No upper extremity supported Standing balance-Leahy Scale: Good                      Cognition Arousal/Alertness: Awake/alert Behavior During Therapy: Flat affect Overall Cognitive Status: Within Functional Limits for tasks assessed                      Exercises      General Comments        Pertinent Vitals/Pain Pain Assessment: Faces Faces Pain Scale: Hurts little more Pain Location: thighs Pain Descriptors / Indicators: Sore Pain Intervention(s): Limited activity within patient's tolerance    Home Living                      Prior Function            PT Goals (current goals can now be found in the care plan section) Acute Rehab PT Goals Patient Stated Goal: to get better PT Goal Formulation: With patient Time For Goal Achievement: 02/21/15 Potential to Achieve Goals: Good Progress towards PT goals: Goals met/education completed, patient discharged from PT    Frequency  Min 3X/week    PT Plan Current plan remains appropriate    Co-evaluation             End of Session Equipment Utilized During Treatment: Gait belt Activity Tolerance: Patient tolerated treatment well Patient left: in bed;with call bell/phone within reach;with bed alarm set;with family/visitor present     Time: 9983-3825 PT Time Calculation (min) (ACUTE ONLY): 19 min  Charges:  $  Gait Training: 8-22 mins                    G Codes:  Functional Assessment Tool Used: clinical judgement Functional Limitation: Mobility: Walking and moving around Mobility: Walking and Moving Around Goal Status [phone]): 0 percent impaired, limited or restricted Mobility: Walking and Moving Around Discharge Status [phone]): At least 1 percent but less than 20 percent impaired, limited or restricted    Richard Ramirez 02/15/2015, 12:11 PM Pager [phone]

## 2015-02-15 NOTE — Discharge Summary (Signed)
[facility]  Physician Discharge Summary   Patient ID: Richard Ramirez MRN: [id number] DOB/AGE: January 25, 1986 29 y.o.  Admit date: 02/13/2015 Discharge date: 02/15/2015  [facility]  DISCHARGE DIAGNOSES:  Principal Problem:   Facial palsy   RECOMMENDATIONS FOR OUTPATIENT FOLLOW UP: 1. Close Follow-up with PCP   DISCHARGE CONDITION: fair  Diet recommendation: Regular  Filed Weights   02/13/15 2032 02/14/15 0027  Weight: 129.275 kg (285 lb) 130.953 kg (288 lb 11.2 oz)    INITIAL HISTORY: 29 year old male with no significant past medical history presented with left facial numbness and drooping. He also had numbness involving his left upper extremity. Patient was hospitalized for further management.  Consultations:  Neurology  Procedures:  None  HOSPITAL COURSE:   Left facial palsy thought to be Bell's palsy Patient with left-sided facial palsy, but also with left arm symptoms. He was seen by neurology. He underwent MRI of the brain which was unremarkable. His symptoms involving the arm have improved. He was seen by physical and occupational therapy. Outpatient therapy has been recommended. He works with machines. Discussed with Dr. Roseanne RenoStewart this morning. He recommends a course of steroids for presumed Bell's palsy. Close outpatient follow-up with his PCP. Outpatient PT and he has been ordered. Patient reassured. Note given for work.  Okay for discharge today.  PERTINENT LABS:  The results of significant diagnostics from this hospitalization (including imaging, microbiology, ancillary and laboratory) are listed below for reference.      Labs: Basic Metabolic Panel:  Recent Labs Lab 02/13/15 2055 02/13/15 2101 02/14/15 0736  NA 140 141 139  K 3.7 3.7 3.8  CL 108 105 109  CO2 23  --  23  GLUCOSE 100* 94 94  BUN 15 16 9   CREATININE 1.04 1.00 0.85  CALCIUM 9.0  --  8.8*   Liver Function Tests:  Recent Labs Lab 02/13/15 2055  02/14/15 0736  AST 27 23  ALT 20 17  ALKPHOS 56 45  BILITOT 0.4 0.7  PROT 7.3 6.4*  ALBUMIN 4.1 3.5   CBC:  Recent Labs Lab 02/13/15 2055 02/13/15 2101  WBC 11.9*  --   NEUTROABS 8.3*  --   HGB 14.6 16.0  HCT 43.2 47.0  MCV 85.7  --   PLT 244  --    CBG:  Recent Labs Lab 02/13/15 2112  GLUCAP 100*     IMAGING STUDIES Ct Head Wo Contrast  02/13/2015  CLINICAL DATA:  Left arm weakness.  Facial droop.  Code stroke. EXAM: CT HEAD WITHOUT CONTRAST TECHNIQUE: Contiguous axial images were obtained from the base of the skull through the vertex without intravenous contrast. COMPARISON:  CT head 09/06/2006 FINDINGS: Ventricle size is normal. Negative for acute or chronic infarction. Negative for hemorrhage or fluid collection. Negative for mass or edema. No shift of the midline structures. Calvarium is intact. IMPRESSION: Normal Critical Value/emergent results were called by telephone at the time of interpretation on 02/13/2015 at 9:05 pm to Dr. Tilden FossaELIZABETH REES , who verbally acknowledged these results. Electronically Signed   By: Marlan Palauharles  Clark M.D.   On: 02/13/2015 21:05   Mr Laqueta JeanBrain W WUWo Contrast  02/14/2015  CLINICAL DATA:  LEFT face and arm numbness, LEFT facial droop. EXAM: MRI HEAD WITHOUT AND WITH CONTRAST TECHNIQUE: Multiplanar, multiecho pulse sequences of the brain and surrounding structures were obtained without and with intravenous contrast. CONTRAST:  20mL MULTIHANCE GADOBENATE DIMEGLUMINE 529 MG/ML IV SOLN COMPARISON:  CT head February 13, 2015 FINDINGS: The ventricles and  sulci are normal for patient's age. No abnormal parenchymal signal, mass lesions, mass effect. No abnormal parenchymal enhancement. No reduced diffusion to suggest acute ischemia. No susceptibility artifact to suggest hemorrhage. No abnormal extra-axial fluid collections. No extra-axial masses nor leptomeningeal enhancement. Normal major intracranial vascular flow voids seen at the skull base. Ocular globes  and orbital contents are unremarkable though not tailored for evaluation. No suspicious calvarial bone marrow signal. No abnormal sellar expansion. Craniocervical junction maintained. Visualized paranasal sinuses and mastoid air cells are well-aerated. IMPRESSION: Normal MRI of the brain with and without contrast. Electronically Signed   By: Awilda Metro M.D.   On: 02/14/2015 05:36    DISCHARGE EXAMINATION: Filed Vitals:   02/14/15 1751 02/14/15 2201 02/15/15 0149 02/15/15 0523  BP: 122/69 130/85 125/74 114/71  Pulse: 70 67 67 68  Temp: 98.3 F (36.8 C) 97.8 F (36.6 C) 98.1 F (36.7 C) 98.3 F (36.8 C)  TempSrc: Oral Oral Oral Oral  Resp: Height:      Weight:      SpO2: 99% 100% 98% 97%   General appearance: alert, cooperative, appears stated age and no distress Resp: clear to auscultation bilaterally Cardio: regular rate and rhythm, S1, S2 normal, no murmur, click, rub or gallop GI: soft, non-tender; bowel sounds normal; no masses,  no organomegaly Extremities: extremities normal, atraumatic, no cyanosis or edema Continues to have left-sided facial droop and inability to close left eye  DISPOSITION: Home  Discharge Instructions    Ambulatory referral to Occupational Therapy    Complete by:  As directed      Ambulatory referral to Physical Therapy    Complete by:  As directed      Call MD for:  difficulty breathing, headache or visual disturbances    Complete by:  As directed      Call MD for:  extreme fatigue    Complete by:  As directed      Call MD for:  persistant dizziness or light-headedness    Complete by:  As directed      Call MD for:  persistant nausea and vomiting    Complete by:  As directed      Call MD for:  severe uncontrolled pain    Complete by:  As directed      Call MD for:  temperature >100.4    Complete by:  As directed      Diet general    Complete by:  As directed      Discharge instructions    Complete by:  As directed    Please follow up with your PCP within 1 week. Take your medications as prescribed.  You were cared for by a hospitalist during your hospital stay. If you have any questions about your discharge medications or the care you received while you were in the hospital after you are discharged, you can call the unit and asked to speak with the hospitalist on call if the hospitalist that took care of you is not available. Once you are discharged, your primary care physician will handle any further medical issues. Please note that NO REFILLS for any discharge medications will be authorized once you are discharged, as it is imperative that you return to your primary care physician (or establish a relationship with a primary care physician if you do not have one) for your aftercare needs so that they can reassess your need for medications and monitor your lab values. If you do  not have a primary care physician, you can call [phone] for a physician referral.     Increase activity slowly    Complete by:  As directed            ALLERGIES: No Known Allergies   Discharge Medication List as of 02/15/2015 11:15 AM    START taking these medications   Details  predniSONE (DELTASONE) 20 MG tablet Take 3 tablets once daily for 3 days, then take 2 tablets once daily for 2 days, then take 1 tablet once daily for 2 days, then stop., Print      STOP taking these medications     LORazepam (ATIVAN) 1 MG tablet        Follow-up Information    Follow up with [place], MD. Schedule an appointment as soon as possible for a visit on 02/22/2015.   Specialty:  Family Medicine   Why:  post hospitalization follow up with Dr.Betty Pecola Leisure per  Ms. Dee (scheduler) 02/22/15 [facility]) at 10am   Contact information:   [address] [place] [address] [phone]       Follow up with [facility].   Specialty:  Rehabilitation   Why:  Neurorehab will call you  to set up the appointment. If you have not received a call by the end of a week you may call at the number provided.    Contact information:   [address] [id number] mc [place] [address] [phone]      TOTAL DISCHARGE TIME: 35 minutes  [facility]  [facility] Pager [phone]  02/15/2015, 2:27 PM

## 2015-02-15 NOTE — Discharge Instructions (Signed)
Bell Palsy °Bell palsy is a condition in which the muscles on one side of the face become paralyzed. This often causes one side of the face to droop. It is a common condition and most people recover completely. °RISK FACTORS °Risk factors for Bell palsy include: °· Pregnancy. °· Diabetes. °· An infection by a virus, such as infections that cause cold sores. °CAUSES  °Bell palsy is caused by damage to or inflammation of a nerve in your face. It is unclear why this happens, but an infection by a virus may lead to it. Most of the time the reason it happens is unknown. °SIGNS AND SYMPTOMS  °Symptoms can range from mild to severe and can take place over a number of hours. Symptoms may include: °· Being unable to: °¨ Raise one or both eyebrows. °¨ Close one or both eyes. °¨ Feel parts of your face (facial numbness). °· Drooping of the eyelid and corner of the mouth. °· Weakness in the face. °· Paralysis of half your face. °· Loss of taste. °· Sensitivity to loud noises. °· Difficulty chewing. °· Tearing up of the affected eye. °· Dryness in the affected eye. °· Drooling. °· Pain behind one ear. °DIAGNOSIS  °Diagnosis of Bell palsy may include: °· A medical history and physical exam. °· An MRI. °· A CT scan. °· Electromyography (EMG). This is a test that checks how your nerves are working. °TREATMENT  °Treatment may include antiviral medicine to help shorten the length of the condition. Sometimes treatment is not needed and the symptoms go away on their own. °HOME CARE INSTRUCTIONS  °· Take medicines only as directed by your health care provider. °· Do facial massages and exercises as directed by your health care provider. °· If your eye is affected: °¨ Use moisturizing eye drops to prevent drying of your eye as directed by your health care provider. °¨ Protect your eye as directed by your health care provider. °SEEK MEDICAL CARE IF: °· Your symptoms do not get better or get worse. °· You are drooling. °· Your eye is red,  irritated, or hurts. °SEEK IMMEDIATE MEDICAL CARE IF:  °· Another part of your body feels weak or numb. °· You have difficulty swallowing. °· You have a fever along with symptoms of Bell palsy. °· You develop neck pain. °MAKE SURE YOU:  °· Understand these instructions. °· Will watch your condition. °· Will get help right away if you are not doing well or get worse. °  °This information is not intended to replace advice given to you by your health care provider. Make sure you discuss any questions you have with your health care provider. °  °Document Released: 02/04/2005 Document Revised: 10/26/2014 Document Reviewed: 05/14/2013 °Elsevier Interactive Patient Education ©2016 [employer] ° °

## 2015-02-15 NOTE — Progress Notes (Signed)
Patient being discharged home with out patient OT/PT discharge summary reviewed and prescriptions given and medications reviewed, IV's removed will transport patient down in wheelchair.

## 2015-02-15 NOTE — Progress Notes (Signed)
Patient would like to take a shower if possible he is still having some issues with balance.

## 2015-02-15 NOTE — Progress Notes (Signed)
Occupational Therapy Treatment/Discharge Patient Details Name: Richard Ramirez MRN: [id number] DOB: Sep 30, 1985 Today's Date: 02/15/2015    History of present illness 29 y.o. male admitted with L facial droop, LUE weakness, decreased sensation on L side. MRI (-) for acute infarct. ? Bell's Palsy    OT comments  Pt completed all transfers and ADLs with supervision-mod I today. Provided pt with eye HEP to strengthen coordination once eye patch has been removed. All education has been completed and pt has no further questions. Pt has no further acute OT needs. OT signing off.   Follow Up Recommendations  No OT follow up;Supervision - Intermittent    Equipment Recommendations  None recommended by OT    Recommendations for Other Services      Precautions / Restrictions Precautions Precautions: Fall Restrictions Weight Bearing Restrictions: No       Mobility Bed Mobility Overal bed mobility: Modified Independent             General bed mobility comments: HOB flat, no use of bedrails to simulate home environment. Increased time and effort  Transfers Overall transfer level: Needs assistance   Transfers: Sit to/from Stand Sit to Stand: Supervision         General transfer comment: Superivison for safety. No unsteadiness or overt LOB. Increased time and effort due to soreness in quads.    Balance Overall balance assessment: Needs assistance Sitting-balance support: No upper extremity supported;Feet supported Sitting balance-Leahy Scale: Normal     Standing balance support: No upper extremity supported;During functional activity Standing balance-Leahy Scale: Fair                     ADL Overall ADL's : Needs assistance/impaired                         Toilet Transfer: Modified Independent;Ambulation;Regular Toilet   Toileting- Clothing Manipulation and Hygiene: Modified independent;Sit to/from stand   Tub/ Shower Transfer: Walk-in  shower;Supervision/safety;Ambulation   Functional mobility during ADLs: Supervision/safety General ADL Comments: Completed all transfers with supervision-mod I. Pt attempting to hold onto wall and door frames today stating that he "can't see very well that's why I'm holding onto to everything." Pt had no LOB or unsteadiness. Provided pt with eye exercises to strengthen coordination when eye patch is removed. Discussed fall prevention strategies      Vision                     Perception     Praxis      Cognition   Behavior During Therapy: WFL for tasks assessed/performed Overall Cognitive Status: Within Functional Limits for tasks assessed                       Extremity/Trunk Assessment               Exercises     Shoulder Instructions       General Comments      Pertinent Vitals/ Pain       Pain Assessment: No/denies pain  Home Living                                          Prior Functioning/Environment              Frequency       Progress Toward Goals  OT Goals(current goals can now be found in the care plan section)  Progress towards OT goals: Goals met/education completed, patient discharged from OT  Acute Rehab OT Goals Patient Stated Goal: to get better OT Goal Formulation: With patient Time For Goal Achievement: 02/28/15 Potential to Achieve Goals: Good ADL Goals Pt/caregiver will Perform Home Exercise Program: Increased strength;With written HEP provided;Independently  Plan All goals met and education completed, patient discharged from OT services    Co-evaluation                 End of Session Equipment Utilized During Treatment: Gait belt   Activity Tolerance Patient tolerated treatment well   Patient Left in bed;with call bell/phone within reach;with bed alarm set   Nurse Communication Mobility status    Functional Assessment Tool Used: clinical judgement Functional Limitation: Self  care Self Care Goal Status (W1027): At least 1 percent but less than 20 percent impaired, limited or restricted Self Care Discharge Status [phone]): At least 1 percent but less than 20 percent impaired, limited or restricted   Time: 0910-0933 OT Time Calculation (min): 23 min  Charges: OT G-codes **NOT FOR INPATIENT CLASS** Functional Assessment Tool Used: clinical judgement Functional Limitation: Self care Self Care Goal Status (Y4034): At least 1 percent but less than 20 percent impaired, limited or restricted Self Care Discharge Status [phone]): At least 1 percent but less than 20 percent impaired, limited or restricted OT General Charges $OT Visit: 1 Procedure OT Treatments $Self Care/Home Management : 23-37 mins  Redmond Baseman, OTR/L 02/15/2015, 9:42 AM Pager: [phone]

## 2015-02-15 NOTE — Care Management Note (Signed)
Case Management Note  Patient Details  Name: Richard Ramirez MRN: [id number] Date of Birth: Dec 05, 1985  Subjective/Objective:                    Action/Plan: Patient is being discharged home with self care. MD ordered for outpatient OT/PT. CM met with the patient and he is interested in going to [facility]. CM provided him with the map and phone number for the facility. Pt also without a PCP listed. Patient states he has not been to his primary in over a year and does not want to return to them. CM provided him the number to [facility] so he can obtain a new PCP. Patient voiced understanding. Bedside RN updated.   Expected Discharge Date:                  Expected Discharge Plan:  Home/Self Care  In-House Referral:     Discharge planning Services  CM Consult  Post Acute Care Choice:    Choice offered to:     DME Arranged:    DME Agency:     HH Arranged:    [place] Agency:     Status of Service:  Completed, signed off  Medicare Important Message Given:    Date Medicare IM Given:    Medicare IM give by:    Date Additional Medicare IM Given:    Additional Medicare Important Message give by:     If discussed at [place] of Stay Meetings, dates discussed:    Additional Comments:  Pollie Friar, RN 02/15/2015, 9:52 AM

## 2015-02-17 ENCOUNTER — Ambulatory Visit: Payer: BLUE CROSS/BLUE SHIELD | Attending: Internal Medicine | Admitting: Physical Therapy

## 2015-02-17 ENCOUNTER — Encounter: Payer: Self-pay | Admitting: Physical Therapy

## 2015-02-17 DIAGNOSIS — R269 Unspecified abnormalities of gait and mobility: Secondary | ICD-10-CM | POA: Diagnosis present

## 2015-02-17 DIAGNOSIS — R278 Other lack of coordination: Secondary | ICD-10-CM | POA: Diagnosis present

## 2015-02-17 DIAGNOSIS — R2681 Unsteadiness on feet: Secondary | ICD-10-CM | POA: Insufficient documentation

## 2015-02-17 NOTE — Therapy (Signed)
[facility] [address] [place], [place], [address] Phone: [phone]   Fax:  [phone]  Physical Therapy Evaluation  Patient Details  Name: Richard Ramirez MRN: [id number] Date of Birth: 03-Sep-1995 Referring Provider: Leilani Able, MD  Encounter Date: 02/17/2015      PT End of Session - 02/17/15 2000    Visit Number 1   Number of Visits 13  eval + 12 visits   Date for PT Re-Evaluation 04/03/15   Authorization Type BCBS   PT Start Time [phone]   PT Stop Time 1019   PT Time Calculation (min) 48 min   Equipment Utilized During Treatment Gait belt   Activity Tolerance Patient tolerated treatment well   Behavior During Therapy Anxious      Past Medical History  Diagnosis Date  . Reflux     Past Surgical History  Procedure Laterality Date  . Wrist surgery Left 2010  . Finger surgery Right 2002    R small finger    There were no vitals filed for this visit.  Visit Diagnosis:  Abnormality of gait - Plan: PT plan of care cert/re-cert  Coordination impairment - Plan: PT plan of care cert/re-cert  Unsteadiness - Plan: PT plan of care cert/re-cert      Subjective Assessment - 02/17/15 0949    Subjective Pt hospitalized from 12/26 - 02/15/15 due to acute onset of L-sided facial numbess and LUE numbness/weakness. Pt placed on predisone and has noted improvements in L facial pain and LUE numbess. Prior to hospitalization, pt completely independent with all mobility and self-care and working FT as Estate agent. Since said hospitalization, pt reports increased LE weakness, stiffness, and general soreness. Pt has had some difficulty standing up from low chairs since onset of weakness. When asked about head tremor, pt reports this has been present since hospitalization and attributes this to "anxiety".   Patient is accompained by: Family member  brother, Richard Ramirez   Pertinent History Acute onset of L facial  numbness, LUE numbness/weakness. CT/MRI negative. 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    Patient Stated Goals "To get back to work and ot get more strength in my legs, and for my face to stop hurting."   Currently in Pain? Yes   Pain Score 2    Pain Location Face   Pain Orientation Left   Pain Descriptors / Indicators Other (Comment)  "like someone punched me in the face"   Pain Type Acute pain   Pain Onset In the past 7 days   Pain Frequency Constant   Aggravating Factors  Cold weather   Pain Relieving Factors staying inside and trying to move my jaws, talking            [facility] PT Assessment - 02/17/15 0001    Assessment   Medical Diagnosis Weakness   Referring Provider Leilani Able, MD   Onset Date/Surgical Date 02/13/15   Precautions   Precautions Fall   Required Braces or Orthoses --   Restrictions   Weight Bearing Restrictions No   Balance Screen   Has the patient fallen in the past 6 months No   Has the patient had a decrease in activity level because of a fear of falling?  Yes   Is the patient reluctant to leave their home because of a fear of falling?  No   Home Nurse, mental health Private residence   Living Arrangements Alone  Type of Home House   Home Access Stairs to enter   Entrance Stairs-Number of Steps 4   Entrance Stairs-Rails Can reach both   Home Layout One level   Prior Function   Level of Independence Independent   Vocation Full time employment   Investment banker, corporate   Cognition   Overall Cognitive Status Within Functional Limits for tasks assessed   Observation/Other Assessments   Observations Noted head tremor, intermittently present during evaluation.  Per pt, this has been present since hospitalization and neurology observed this.   Sensation   Light Touch Appears Intact  B LE's   Coordination   Gross Motor Movements are Fluid and  Coordinated No   Heel Shin Test Slow, rigid movements, limited excursion in BLE's due to "soreness" per pt   ROM / Strength   AROM / PROM / Strength Strength   Strength   Overall Strength Within functional limits for tasks performed   Overall Strength Comments Grossly WNL in BLE's.                   [facility] Adult PT Treatment/Exercise - 02/17/15 0001    Transfers   Transfers Sit to Stand;Stand to Sit   Sit to Stand 5: Supervision;4: Min guard;4: Min assist   Sit to Stand Details (indicate cue type and reason) initially required min A due to posterior LOB when attempting sit > stand without UE use   Five time sit to stand comments  49.38 sec  >/= 12 seconds indicates risk of recurrent falls   Stand to Sit 5: Supervision   Ambulation/Gait   Ambulation/Gait Yes   Ambulation/Gait Assistance 5: Supervision   Ambulation/Gait Assistance Details Very slow gait pattern characterized by minimal reciprocal arm swing, decreased trunk rotation and rigid LE movement.   Ambulation Distance (Feet) 120 Feet  then x75'   Assistive device None   Gait Pattern Step-through pattern;Decreased arm swing - right;Decreased arm swing - left;Decreased stride length;Decreased hip/knee flexion - right;Decreased hip/knee flexion - left;Decreased trunk rotation;Wide base of support;Trunk flexed  decreased B hip extension throughout gait cycle   Ambulation Surface Level;Indoor   Gait velocity 0.39 ft/sec   Stairs Yes   Stairs Assistance 5: Supervision   Stair Management Technique Two rails;Alternating pattern;Forwards   Number of Stairs 4   Height of Stairs 6   Balance   Balance Assessed Yes   Static Standing Balance   Static Standing - Balance Support No upper extremity supported   Static Standing - Level of Assistance 5: Stand by assistance   Static Standing - Comment/# of Minutes Feet together > 30 seconds without LOB. R tandem stance > 15 seconds prior to LOB to L side with effective self-recovery.                 PT Education - 02/17/15 1941    Education provided Yes   Education Details Pt eval findings, goals, and POC.   Person(s) Educated Patient;Other (comment)  brother   Methods Explanation   Comprehension Verbalized understanding          PT Short Term Goals - 02/17/15 2022    PT SHORT TERM GOAL #5   Title Pt will ambulate x500' over unlevel, paved surfaces with mod I using LRAD to indicate increased safety with limited community distances.  Target date: 03/17/15           PT Long Term Goals - 02/17/15 2022    PT LONG TERM GOAL #1  Title Pt will improve Berg score by 8 points from baseline to indicate significant improvement in functional standing balance. Target date: 03/31/15   PT LONG TERM GOAL #2   Title Pt will improve gait velocity from 0.39 ft/sec to > 1.0 ft/sec to indicate increased efficiency of ambulation.  Target date: 03/31/15   PT LONG TERM GOAL #3   Title Pt will improve 5X STS time from 49.38 sec to < 39 seconds to indicate increased efficiency of functional transfers. Target date: 03/31/15   PT LONG TERM GOAL #4   Title Pt will ambulate > 1,000' over unlevel, paved surfaces with mod I using LRAD to indicate increased safety with community distances.  Target date: 03/31/15   PT LONG TERM GOAL #5   Title Pt will negotiate standard ramp and curb step with mod I using LRAD to indicate increased safety traversing community obstacles. Target date: 03/31/15   Additional Long Term Goals   Additional Long Term Goals Yes   PT LONG TERM GOAL #6   Title Pt will independently negotiate 4 steps with single rail to indicate increased community access. Target date: 03/31/15   PT LONG TERM GOAL #7   Title Pt will verbalize plans for exercise at gym to facilitate safe transition to community fitness, progress toward PLOF. Target date: 03/31/15               Plan - 02/17/15 2003    Clinical Impression Statement Pt is a 29 y/o M referred to outpatient  neuro PT to address functional impairments associated with recent hospitalization due to acute onset of L facial palsy, numbness/sensory disturbance in L aspect of face and LUE. PT evaluation reveals abnormal gait pattern; iimpaired gross motor coordination; decreased stability/independence with functional transfers/mobilty; gait speed and 5 Times Sit to Stand Test both suggestive of significant fall risk; pt-perceived "stiffness" in trunk and BLE's. Pt will benefit from skilled outpatient PT to address said impairments.    Pt will benefit from skilled therapeutic intervention in order to improve on the following deficits Pain;Other (comment);Abnormal gait;Impaired flexibility;Decreased coordination;Decreased balance;Decreased knowledge of use of DME;Decreased endurance;Difficulty walking  PT will monitor pain but will not directly address due to nature of facial pain.   Rehab Potential Good   Clinical Impairments Affecting Rehab Potential No definitive diagnosis   PT Frequency 2x / week   PT Duration 6 weeks   PT Treatment/Interventions ADLs/Self Care Home Management;DME Instruction;Balance training;Therapeutic exercise;Therapeutic activities;Patient/family education;Functional mobility training;Neuromuscular re-education;Stair training;Gait training;Orthotic Fit/Training;Manual techniques;Passive range of motion;Vestibular   PT Next Visit Plan Perform Berg. Initiate HEP with focus on coordination of gross movements (sit <> stand, gait with reciprocal arm swing). If pt presentation unchanged (gait speed not significantly improved), consider scheduling for additional 2 weeks (total of 6 weeks) of PT.   Consulted and Agree with Plan of Care Patient;Family member/caregiver   Family Member Consulted brother, Richard ShowsMiguel         Problem List Patient Active Problem List   Diagnosis Date Noted  . Weakness 02/13/2015  . Facial palsy 02/13/2015   Jorje GuildBlair Harshika Mago, PT, DPT [facility] [address] [place], [place], [address] Phone: [phone]   Fax:  [phone] 02/17/2015, 8:40 PM  Name: Dollene PrimroseManuel Gariepy MRN: [id number] Date of Birth: February 04, 1986

## 2015-02-21 ENCOUNTER — Encounter: Payer: Self-pay | Admitting: Occupational Therapy

## 2015-02-21 ENCOUNTER — Ambulatory Visit: Payer: BLUE CROSS/BLUE SHIELD | Attending: Internal Medicine | Admitting: Occupational Therapy

## 2015-02-21 DIAGNOSIS — R269 Unspecified abnormalities of gait and mobility: Secondary | ICD-10-CM | POA: Insufficient documentation

## 2015-02-21 DIAGNOSIS — R2681 Unsteadiness on feet: Secondary | ICD-10-CM | POA: Insufficient documentation

## 2015-02-21 DIAGNOSIS — R278 Other lack of coordination: Secondary | ICD-10-CM | POA: Diagnosis present

## 2015-02-21 DIAGNOSIS — R201 Hypoesthesia of skin: Secondary | ICD-10-CM

## 2015-02-21 DIAGNOSIS — M6281 Muscle weakness (generalized): Secondary | ICD-10-CM

## 2015-02-21 DIAGNOSIS — R29898 Other symptoms and signs involving the musculoskeletal system: Secondary | ICD-10-CM

## 2015-02-21 NOTE — Patient Instructions (Signed)
Theraputty exercises:  Using red color putty:  Do these 1-2 times per day. STOP if you get pain and let your therapist know.  1. Make a ball     Make a pancake     Make a cone Do this sequence 5 times  2. Make a fat hot dog. Squeeze as hard as you can. Do this 10 times 3. Ring around the fingers.  Do this 10 times. 5. Make a snake:  2 pt pinch  3 pt pinch  Lateral pinch  Do these 3 times.

## 2015-02-21 NOTE — Therapy (Signed)
[facility] [address] [place], [place], [address] Phone: [phone]   Fax:  [phone]  Occupational Therapy Evaluation  Patient Details  Name: Kenley Troop MRN: [id number] Date of Birth: [birth year] Referring Provider: Dr. Lyn Records  Encounter Date: 02/21/2015      OT End of Session - 02/21/15 1623    Visit Number 1   Number of Visits 4   Date for OT Re-Evaluation 03/21/15   Authorization Type BCBS - no visit limitations   OT Start Time 1530   OT Stop Time 1610   OT Time Calculation (min) 40 min   Activity Tolerance Patient tolerated treatment well      Past Medical History  Diagnosis Date  . Reflux     Past Surgical History  Procedure Laterality Date  . Wrist surgery Left 2010  . Finger surgery Right 2002    R small finger    There were no vitals filed for this visit.  Visit Diagnosis:  Decreased grip strength of left hand - Plan: Ot plan of care cert/re-cert  Muscle weakness of left upper extremity - Plan: Ot plan of care cert/re-cert  Impaired sensation - Plan: Ot plan of care cert/re-cert      Subjective Assessment - 02/21/15 1533    Subjective  I am doing better than I was - I still have pain in my jaw and my vision is blurry in my left hand.   Pertinent History see epic. pt with facial weakness (Bell's palsey) and LUE numbness starting 02/13/2015.  Hospitalized 02/13/15-12/28-16, No definitive diagnosis beyond Bell's Palsey   Patient Stated Goals I want my face to feel normal again   Currently in Pain? Yes   Pain Score 7    Pain Location Face   Pain Orientation Left   Pain Descriptors / Indicators Numbness;Sharp;Sore   Pain Type Acute pain   Pain Onset 1 to 4 weeks ago   Pain Frequency Constant   Aggravating Factors  cold weather and worse first thing in the am.   Pain Relieving Factors movement of my jaw, talking.             [facility] OT Assessment - 02/21/15 0001    Assessment   Diagnosis Facial weakness and intermittent LUE numbness   Referring Provider Dr. Lyn Records   Onset Date 02/13/15   Prior Therapy PT, OT in acute care. Pt currently seeing PT in outpatient setting.    Precautions   Precautions Fall   Restrictions   Weight Bearing Restrictions No   Balance Screen   Has the patient fallen in the past 6 months No   Home  Environment   Family/patient expects to be discharged to: Private residence   Living Arrangements Parent   Type of Home House   Home Layout One level   Additional Comments Pt has no problems using the shower or bathroom.   Prior Function   Level of Independence Independent   Vocation Full time employment   Research officer, political party   ADL   Eating/Feeding Independent   Grooming Modified independent  due to numbness in face   Upper Body Bathing Independent   Lower Body Bathing Independent   Upper Body Dressing Independent   Lower Body Dressing Independent   Toilet Tranfer Independent   Toileting - Conservator, museum/gallery -  Tour manager Independent   ADL comments Pt states brushing teeth and eating can be hard due  to facial numbness   IADL   Shopping Takes care of all shopping needs independently   Light Housekeeping Maintains house alone or with occasional assistance   Meal Prep Plans, prepares and serves adequate meals independently   Community Mobility Drives own vehicle   Medication Management Is responsible for taking medication in correct dosages at correct time   Development worker, community financial matters independently (budgets, writes checks, pays rent, bills goes to bank), collects and keeps track of income   Mobility   Mobility Status Independent   Written Expression   Dominant Hand Right   Vision - History   Baseline Vision Wears glasses for distance only   Vision Assessment   Eye Alignment Within Functional Limits   Comment Pt  unable to close L eye fully due to Bell's Palsey   Activity Tolerance   Activity Tolerance Endurance does not limit participation in activity   Cognition   Overall Cognitive Status Within Functional Limits for tasks assessed   Sensation   Light Touch Appears Intact   Hot/Cold Impaired by gross assessment  pt states feels a little duller on L side   Proprioception Appears Intact   Coordination   Gross Motor Movements are Fluid and Coordinated Yes   Fine Motor Movements are Fluid and Coordinated Yes   9 Hole Peg Test Right;Left   Right 9 Hole Peg Test 22.10   Left 9 Hole Peg Test 25.36   Tone   Assessment Location Right Upper Extremity;Left Upper Extremity   ROM / Strength   AROM / PROM / Strength AROM;Strength   AROM   Overall AROM  Within functional limits for tasks performed   Strength   Overall Strength Other (comment)   Overall Strength Comments Pt with full AROM against gravity and able to resist all planes in LUE except shoulder flexion (3/5 intiitally but with cues 4/5)   Hand Function   Right Hand Gross Grasp Functional   Right Hand Grip (lbs) 80   Left Hand Gross Grasp Impaired   Left Hand Grip (lbs) 20   Comment Pt functionally appears to have more strength in LUE than he exhibits on testing.    RUE Tone   RUE Tone Within Functional Limits   LUE Tone   LUE Tone Within Functional Limits                         OT Education - 02/21/15 1612    Education provided Yes   Education Details OT eval findings, POC.  theraputty program with red putty   Person(s) Educated Patient   Methods Explanation;Demonstration;Verbal cues;Handout   Comprehension Verbalized understanding;Returned demonstration             OT Long Term Goals - 02/21/15 1612    OT LONG TERM GOAL #1   Title Pt will be mod I with HEP to strengthen R grip strength to assist with functional tasks.    Status New   OT LONG TERM GOAL #2   Title Pt will be mod i with HEP to strengthen  LUE proximal strength to assist with funcitonal tasks.    Status New   OT LONG TERM GOAL #3   Title Pt will verbalize understanding on how to upgrade HEP.    Status New               Plan - 02/21/15 1614    Clinical Impression Statement Pt is a 30 year male with significant  medical history;  sudden onset of facial weakness, numbness on 02/13/2015. Admitted to hospiital to r/o stroke, etc.  Pt then c/o numbness in LUE on 12/27/201.  CT/MRI negative. Pt discharged from hosptial on 02/15/2015 with diagnosis of Bell's Palsey and  no other definitive diagnosis.  Pt presents today with the following impairments:  tingling in L non dominant hand, decreased grip strength, decreased proximal strength for shoulder flexion. Pt iniitially did not report any difficulty in functional tasks related to LUE use however then stated he had trouble opening bottles.  Pt will beneift from short course of skilled OT to address these deficits to alllow him to use his LUE in more functional tasks.    Pt will benefit from skilled therapeutic intervention in order to improve on the following deficits (Retired) Decreased strength;Impaired sensation;Impaired perceived functional ability;Impaired UE functional use;Pain   Rehab Potential Good   Clinical Impairments Affecting Rehab Potential impaired perception of LUE functional use   OT Frequency 2x / week  total of 4 visits   OT Duration 2 weeks   OT Treatment/Interventions Therapeutic exercise;Therapeutic activities;Patient/family education   Plan check theraputty program given on day of eval, add proximal resistive exercise to LUE for shoulder flexion   Consulted and Agree with Plan of Care Patient        Problem List Patient Active Problem List   Diagnosis Date Noted  . Weakness 02/13/2015  . 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  [address] [place], [place], [address] Phone: [phone]   Fax:  [phone]  Name: Dollene PrimroseManuel Difatta MRN: [id number] Date of Birth: May 15, 1985

## 2015-02-27 ENCOUNTER — Ambulatory Visit: Payer: BLUE CROSS/BLUE SHIELD | Admitting: Physical Therapy

## 2015-02-27 ENCOUNTER — Ambulatory Visit: Payer: BLUE CROSS/BLUE SHIELD | Admitting: Occupational Therapy

## 2015-02-28 ENCOUNTER — Ambulatory Visit: Payer: BLUE CROSS/BLUE SHIELD | Admitting: Occupational Therapy

## 2015-02-28 ENCOUNTER — Ambulatory Visit: Payer: BLUE CROSS/BLUE SHIELD | Admitting: Physical Therapy

## 2015-03-07 ENCOUNTER — Encounter: Payer: Self-pay | Admitting: Physical Therapy

## 2015-03-07 ENCOUNTER — Ambulatory Visit: Payer: BLUE CROSS/BLUE SHIELD | Admitting: Physical Therapy

## 2015-03-07 DIAGNOSIS — M6281 Muscle weakness (generalized): Secondary | ICD-10-CM | POA: Diagnosis not present

## 2015-03-07 DIAGNOSIS — R2681 Unsteadiness on feet: Secondary | ICD-10-CM

## 2015-03-07 DIAGNOSIS — R278 Other lack of coordination: Secondary | ICD-10-CM

## 2015-03-07 DIAGNOSIS — R269 Unspecified abnormalities of gait and mobility: Secondary | ICD-10-CM

## 2015-03-07 NOTE — Patient Instructions (Addendum)
Walking Program:  Begin walking for exercise for 3 minutes, 3  times/day,  5 days/week.   Progress your walking program by adding 2-3 minutes to your routine each week, as tolerated, until walking 30 minutes a time with no rest or increased pain. Be sure to wear good walking shoes, walk in a safe environment and only progress to your tolerance.      Functional Quadriceps: Sit to Stand    Sit on edge of chair, feet flat on floor. Stand upright, extending knees fully. Repeat __10 times per set. Do __1-2__ sets per session. Do __1-2__ sessions per day.  http://orth.exer.us/734   Copyright  VHI. All rights reserved.  Chair Sitting    Sit at edge of seat, spine straight, one leg extended. Put a hand on each thigh and bend forward from the hip, keeping spine straight. Allow hand on extended leg to reach toward toes. Support upper body with other arm. Hold __20-30_ seconds. Repeat __3_ times each leg. Do _1-2_ sessions per day.  Copyright  VHI. All rights reserved.

## 2015-03-08 ENCOUNTER — Ambulatory Visit: Payer: BLUE CROSS/BLUE SHIELD | Admitting: Occupational Therapy

## 2015-03-08 ENCOUNTER — Ambulatory Visit: Payer: BLUE CROSS/BLUE SHIELD | Admitting: Physical Therapy

## 2015-03-08 NOTE — Therapy (Signed)
[facility] [address] [place], [place], [address] Phone: [phone]   Fax:  [phone]  Physical Therapy Treatment  Patient Details  Name: Richard Ramirez MRN: [id number] Date of Birth: [birth year] Referring Provider: Leilani Able, MD  Encounter Date: 03/07/2015      PT End of Session - 03/07/15 1454    Visit Number 2   Number of Visits 13  eval + 12 visits   Date for PT Re-Evaluation 04/03/15   Authorization Type BCBS   PT Start Time 1446   PT Stop Time 1530   PT Time Calculation (min) 44 min   Equipment Utilized During Treatment Gait belt   Activity Tolerance Patient tolerated treatment well   Behavior During Therapy Anxious      Past Medical History  Diagnosis Date  . Reflux     Past Surgical History  Procedure Laterality Date  . Wrist surgery Left 2010  . Finger surgery Right 2002    R small finger    There were no vitals filed for this visit.  Visit Diagnosis:  Abnormality of gait  Coordination impairment  Unsteadiness      Subjective Assessment - 03/07/15 1450    Subjective No new complaints. Does report he feels he is walking better/faster, still with left sided weakness. 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 Reports low back pain after having to walk to gas station today (ran out of gas on the way to clinic). Reports that he has "just a little bit" of back pain. Also reports a fall down the stairs due to snow/ice Monday of last week.                                   Pertinent History Acute onset of L facial numbness, LUE numbness/weakness. CT/MRI negative. 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    Patient Stated Goals "To get back to work and to get more strength in my legs, and for my face to stop hurting."   Currently in Pain? Yes   Pain Score 8    Pain Location Back   Pain Orientation Lower   Pain Descriptors / Indicators Aching;Sore   Pain Type Chronic pain   Pain Onset More than a month ago   Pain Frequency Intermittent   Aggravating Factors  increased walking, activity   Pain Relieving Factors rest            [facility] Adult PT Treatment/Exercise - 03/07/15 1455    Ambulation/Gait   Ambulation/Gait Yes   Ambulation/Gait Assistance 5: Supervision   Ambulation Distance (Feet) 420 Feet   Assistive device None   Gait Pattern Step-through pattern;Decreased stride length;Trunk flexed;Decreased arm swing - right;Decreased arm swing - left   Ambulation Surface Level;Indoor   Gait velocity 11.75 sec= 2.80 ft/sec no AD   Berg Balance Test   Sit to Stand Able to stand without using hands and stabilize independently   Standing Unsupported Able to stand safely 2 minutes   Sitting with Back Unsupported but Feet Supported on Floor or Stool Able to sit safely and securely 2 minutes   Stand to Sit Sits safely with minimal use of hands   Transfers Able to transfer safely, minor use of hands   Standing Unsupported with Eyes Closed Able to stand 10 seconds safely  used hand to  cover left eye due to Bells Palsy   Standing Ubsupported with Feet Together Able to place feet together independently and stand 1 minute safely   From Standing, Reach Forward with Outstretched Arm Can reach confidently >25 cm (10")   From Standing Position, Pick up Object from Floor Able to pick up shoe safely and easily   From Standing Position, Turn to Look Behind Over each Shoulder Looks behind from both sides and weight shifts well  increased back pain with looking over left shoulder   Turn 360 Degrees Able to turn 360 degrees safely but slowly  >/= 6 seconds each way   Standing Unsupported, Alternately Place Feet on Step/Stool Able to stand independently and safely and complete 8 steps in 20 seconds  18.35 sec   Standing Unsupported, One Foot in Front Able to place foot tandem  independently and hold 30 seconds   Standing on One Leg Able to lift leg independently and hold 5-10 seconds   Total Score 53   Dynamic Gait Index   Level Surface Mild Impairment   Change in Gait Speed Mild Impairment   Gait with Horizontal Head Turns Normal   Gait with Vertical Head Turns Normal   Gait and Pivot Turn Normal   Step Over Obstacle Mild Impairment   Step Around Obstacles Normal   Steps Mild Impairment   Total Score 20           PT Education - 03/07/15 1527    Education provided Yes   Education Details HEP: walking program, sit<>stands for strengthening and seated hamstring stretching for lower back pain   Person(s) Educated Patient   Methods Handout   Comprehension Returned demonstration;Verbalized understanding          PT Short Term Goals - 02/17/15 2022    PT SHORT TERM GOAL #5   Title Pt will ambulate x500' over unlevel, paved surfaces with mod I using LRAD to indicate increased safety with limited community distances.  Target date: 03/17/15           PT Long Term Goals - 02/17/15 2022    PT LONG TERM GOAL #1   Title Pt will improve Berg score by 8 points from baseline to indicate significant improvement in functional standing balance. Target date: 03/31/15   PT LONG TERM GOAL #2   Title Pt will improve gait velocity from 0.39 ft/sec to > 1.0 ft/sec to indicate increased efficiency of ambulation.  Target date: 03/31/15   PT LONG TERM GOAL #3   Title Pt will improve 5X STS time from 49.38 sec to < 39 seconds to indicate increased efficiency of functional transfers. Target date: 03/31/15   PT LONG TERM GOAL #4   Title Pt will ambulate > 1,000' over unlevel, paved surfaces with mod I using LRAD to indicate increased safety with community distances.  Target date: 03/31/15   PT LONG TERM GOAL #5   Title Pt will negotiate standard ramp and curb step with mod I using LRAD to indicate increased safety traversing community obstacles. Target date: 03/31/15    Additional Long Term Goals   Additional Long Term Goals Yes   PT LONG TERM GOAL #6   Title Pt will independently negotiate 4 steps with single rail to indicate increased community access. Target date: 03/31/15   PT LONG TERM GOAL #7   Title Pt will verbalize plans for exercise at gym to facilitate safe transition to community fitness, progress toward PLOF. Target date: 03/31/15  03/07/15 1642  Plan  Clinical Impression Statement Pt has made significant progress since eval with gait speed and balance, increased 10 meter walk test score and decreased assistance needed with gait. Pt scored in lower risk catergory of [employer] test as well. Issued initial HEP today. Progress made in session today discussed with primary PT, at this time it is anticipated that the full plan of care duration may not be needed if pt continues to progress this well.                                  Pt will benefit from skilled therapeutic intervention in order to improve on the following deficits Pain;Other (comment);Abnormal gait;Impaired flexibility;Decreased coordination;Decreased balance;Decreased knowledge of use of DME;Decreased endurance;Difficulty walking (PT will monitor pain but will not directly address due to nature of facial pain.)  Rehab Potential Good  Clinical Impairments Affecting Rehab Potential No definitive diagnosis  PT Frequency 2x / week  PT Duration 6 weeks  PT Treatment/Interventions ADLs/Self Care Home Management;DME Instruction;Balance training;Therapeutic exercise;Therapeutic activities;Patient/family education;Functional mobility training;Neuromuscular re-education;Stair training;Gait training;Orthotic Fit/Training;Manual techniques;Passive range of motion;Vestibular  PT Next Visit Plan Work on high level balance, gait on compliant surfaces toward goals  Consulted and Agree with Plan of Care Patient       Problem List Patient Active Problem List   Diagnosis Date Noted  .  Weakness 02/13/2015  . 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

## 2015-03-09 ENCOUNTER — Emergency Department (HOSPITAL_COMMUNITY): Payer: BLUE CROSS/BLUE SHIELD

## 2015-03-09 ENCOUNTER — Encounter (HOSPITAL_COMMUNITY): Payer: Self-pay | Admitting: Family Medicine

## 2015-03-09 ENCOUNTER — Emergency Department (HOSPITAL_COMMUNITY)
Admission: EM | Admit: 2015-03-09 | Discharge: 2015-03-09 | Disposition: A | Discharge status: Home / Self Care | Payer: BLUE CROSS/BLUE SHIELD | Attending: Emergency Medicine | Admitting: Emergency Medicine

## 2015-03-09 DIAGNOSIS — R109 Unspecified abdominal pain: Secondary | ICD-10-CM

## 2015-03-09 DIAGNOSIS — Z8719 Personal history of other diseases of the digestive system: Secondary | ICD-10-CM | POA: Insufficient documentation

## 2015-03-09 DIAGNOSIS — R200 Anesthesia of skin: Secondary | ICD-10-CM | POA: Diagnosis present

## 2015-03-09 DIAGNOSIS — Z87891 Personal history of nicotine dependence: Secondary | ICD-10-CM | POA: Diagnosis not present

## 2015-03-09 DIAGNOSIS — G8194 Hemiplegia, unspecified affecting left nondominant side: Secondary | ICD-10-CM

## 2015-03-09 DIAGNOSIS — R112 Nausea with vomiting, unspecified: Secondary | ICD-10-CM | POA: Insufficient documentation

## 2015-03-09 DIAGNOSIS — I639 Cerebral infarction, unspecified: Secondary | ICD-10-CM

## 2015-03-09 LAB — I-STAT CHEM 8, ED
BUN: 21 mg/dL — AB (ref 6–20)
CHLORIDE: 104 mmol/L (ref 101–111)
CREATININE: 1.1 mg/dL (ref 0.61–1.24)
Calcium, Ion: 1.13 mmol/L (ref 1.12–1.23)
Glucose, Bld: 89 mg/dL (ref 65–99)
HEMATOCRIT: 47 % (ref 39.0–52.0)
Hemoglobin: 16 g/dL (ref 13.0–17.0)
Potassium: 3.5 mmol/L (ref 3.5–5.1)
Sodium: 141 mmol/L (ref 135–145)
TCO2: 24 mmol/L (ref 0–100)

## 2015-03-09 LAB — CBC
HEMATOCRIT: 44.2 % (ref 39.0–52.0)
HEMOGLOBIN: 14.6 g/dL (ref 13.0–17.0)
MCH: 28.8 pg (ref 26.0–34.0)
MCHC: 33 g/dL (ref 30.0–36.0)
MCV: 87.2 fL (ref 78.0–100.0)
Platelets: 245 10*3/uL (ref 150–400)
RBC: 5.07 MIL/uL (ref 4.22–5.81)
RDW: 13.1 % (ref 11.5–15.5)
WBC: 10.4 10*3/uL (ref 4.0–10.5)

## 2015-03-09 LAB — CBG MONITORING, ED: Glucose-Capillary: 83 mg/dL (ref 65–99)

## 2015-03-09 LAB — COMPREHENSIVE METABOLIC PANEL
ALK PHOS: 52 U/L (ref 38–126)
ALT: 18 U/L (ref 17–63)
AST: 17 U/L (ref 15–41)
Albumin: 4.4 g/dL (ref 3.5–5.0)
Anion gap: 13 (ref 5–15)
BUN: 17 mg/dL (ref 6–20)
CALCIUM: 9.7 mg/dL (ref 8.9–10.3)
CHLORIDE: 105 mmol/L (ref 101–111)
CO2: 24 mmol/L (ref 22–32)
CREATININE: 1.22 mg/dL (ref 0.61–1.24)
Glucose, Bld: 90 mg/dL (ref 65–99)
Potassium: 3.7 mmol/L (ref 3.5–5.1)
Sodium: 142 mmol/L (ref 135–145)
Total Bilirubin: 0.5 mg/dL (ref 0.3–1.2)
Total Protein: 7.3 g/dL (ref 6.5–8.1)

## 2015-03-09 LAB — URINALYSIS, ROUTINE W REFLEX MICROSCOPIC
Bilirubin Urine: NEGATIVE
GLUCOSE, UA: NEGATIVE mg/dL
Hgb urine dipstick: NEGATIVE
Ketones, ur: NEGATIVE mg/dL
LEUKOCYTES UA: NEGATIVE
NITRITE: NEGATIVE
PH: 6 (ref 5.0–8.0)
Protein, ur: NEGATIVE mg/dL
SPECIFIC GRAVITY, URINE: 1.027 (ref 1.005–1.030)

## 2015-03-09 LAB — DIFFERENTIAL
BASOS ABS: 0 10*3/uL (ref 0.0–0.1)
Basophils Relative: 0 %
Eosinophils Absolute: 0.1 10*3/uL (ref 0.0–0.7)
Eosinophils Relative: 1 %
LYMPHS ABS: 2.7 10*3/uL (ref 0.7–4.0)
LYMPHS PCT: 26 %
MONO ABS: 0.9 10*3/uL (ref 0.1–1.0)
MONOS PCT: 8 %
NEUTROS ABS: 6.7 10*3/uL (ref 1.7–7.7)
Neutrophils Relative %: 65 %

## 2015-03-09 LAB — PROTIME-INR
INR: 1.04 (ref 0.00–1.49)
Prothrombin Time: 13.8 seconds (ref 11.6–15.2)

## 2015-03-09 LAB — APTT: APTT: 33 s (ref 24–37)

## 2015-03-09 LAB — I-STAT TROPONIN, ED: TROPONIN I, POC: 0 ng/mL (ref 0.00–0.08)

## 2015-03-09 MED ORDER — CYCLOBENZAPRINE HCL 10 MG PO TABS: 10.0000 mg | ORAL_TABLET | Freq: Three times a day (TID) | ORAL | Status: DC | PRN

## 2015-03-09 MED ORDER — IBUPROFEN 600 MG PO TABS: 600.0000 mg | ORAL_TABLET | Freq: Three times a day (TID) | ORAL | Status: DC | PRN

## 2015-03-09 MED ORDER — ONDANSETRON HCL 4 MG/2ML IJ SOLN
4.0000 mg | Freq: Once | INTRAMUSCULAR | Status: AC
  Administered 2015-03-09: 4 mg via INTRAVENOUS
  Filled 2015-03-09: qty 2

## 2015-03-09 MED ORDER — LORAZEPAM 2 MG/ML IJ SOLN
1.0000 mg | Freq: Once | INTRAMUSCULAR | Status: DC
  Filled 2015-03-09: qty 1

## 2015-03-09 MED ORDER — MORPHINE SULFATE (PF) 4 MG/ML IV SOLN
4.0000 mg | Freq: Once | INTRAVENOUS | Status: AC
  Administered 2015-03-09: 4 mg via INTRAVENOUS
  Filled 2015-03-09: qty 1

## 2015-03-09 MED ORDER — KETOROLAC TROMETHAMINE 30 MG/ML IJ SOLN
30.0000 mg | Freq: Once | INTRAMUSCULAR | Status: AC
  Administered 2015-03-09: 30 mg via INTRAVENOUS
  Filled 2015-03-09: qty 1

## 2015-03-09 NOTE — ED Notes (Signed)
Neuro Stroke team at bedside.

## 2015-03-09 NOTE — ED Notes (Signed)
Pt here for left arm numbness, weakness and facial numbness that started about 4:48 on his way here pt vomiting  here for kidney pain also. Dr. Preston Fleeting saw pt ant sts call a code stroke. Pt left grip weaker. No facial droop noted.

## 2015-03-09 NOTE — Consult Note (Addendum)
Referring Physician: ED    Chief Complaint: code stroke, left hemiparesis  HPI:                                                                                                                                         Richard Ramirez is an 30 y.o. male with a past medical history significant for GERD, left Bell's palsy,presents to the ED with complains of left sided numbness-weakness. Patient was hospitalized from 12/26 - 02/15/15 due to acute onset of L-sided facial numbess and LUE numbness/weakness with unrevealing work up including negative MR brain. He comes back today stating that he was at work when started having kidney pain and subsequently developed numbness and weakness of the left face and arm with some " heaviness of the left leg. On his way here pt vomiting here for kidney pain also. NIHSS  CT brain without acute abnormality. Denies HA, vertigo, but complains of " my vision going up and down", no feeling well, pain left arm.   Date last known well:  Time last known well:  tPA Given: no, functional exam NIHSS: 5 MRS: 0  Past Medical History  Diagnosis Date  . Reflux     Past Surgical History  Procedure Laterality Date  . Wrist surgery Left 2010  . Finger surgery Right 2002    R small finger    Family History  Problem Relation Age of Onset  . Hyperlipidemia Mother    Social History:  reports that he has quit smoking. His smoking use included Cigarettes. He does not have any smokeless tobacco history on file. He reports that he drinks alcohol. He reports that he does not use illicit drugs.  Allergies: No Known Allergies  Medications:                                                                                                                           I have reviewed the patient's current medications.  ROS:  History obtained from  chart review and the patient  General ROS: negative for - chills, fever, night sweats, or weight loss Psychological ROS: negative for - behavioral disorder, hallucinations, memory difficulties, mood swings or suicidal ideation Ophthalmic ROS: negative for - double vision, eye pain or loss of vision ENT ROS: negative for - epistaxis, nasal discharge, oral lesions, sore throat, tinnitus or vertigo Allergy and Immunology ROS: negative for - hives or itchy/watery eyes Hematological and Lymphatic ROS: negative for - bleeding problems, bruising or swollen lymph nodes Endocrine ROS: negative for - galactorrhea, hair pattern changes, polydipsia/polyuria or temperature intolerance Respiratory ROS: negative for - cough, hemoptysis, shortness of breath or wheezing Cardiovascular ROS: negative for - chest pain, dyspnea on exertion, edema or irregular heartbeat Gastrointestinal ROS: negative for - abdominal pain, diarrhea, hematemesis, nausea/vomiting or stool incontinence Genito-Urinary ROS: negative for - dysuria, hematuria, incontinence or urinary frequency/urgency Musculoskeletal ROS: negative for - joint swelling Neurological ROS: as noted in HPI Dermatological ROS: negative for rash and skin lesion changes  Physical exam:  Constitutional: well developed, pleasant male in no apparent distress. Blood pressure 146/124, pulse 96, temperature 99 F (37.2 C), temperature source Oral, resp. rate 15, SpO2 96 %. Eyes: no jaundice or exophthalmos.  Head: normocephalic. Neck: supple, no bruits, no JVD. Cardiac: no murmurs. Lungs: clear. Abdomen: soft, no tender, no mass. Extremities: no edema, clubbing, or cyanosis.  Skin: no rash  Neurologic Examination:                                                                                                      General: NAD Mental Status: Alert, oriented, thought content appropriate.  Speech fluent without evidence of aphasia.  Able to follow 3 step commands  without difficulty. Cranial Nerves: II: Discs flat bilaterally; Visual fields grossly normal, pupils equal, round, reactive to light and accommodation III,IV, VI: ptosis not present, extra-ocular motions intact bilaterally V,VII: smile asymmetric due to left face weakness that seems to be peripheral, facial light touch sensation decreased in the left but inconsistent pattern VIII: hearing normal bilaterally IX,X: uvula rises symmetrically XI: bilateral shoulder shrug XII: midline tongue extension without atrophy or fasciculations\ Motor: Left arm>leg weakness, but seems to be functional weakness Tone and bulk:normal tone throughout; no atrophy noted Sensory: very inconsistent exam, ??? Decreased pinprick left arm Deep Tendon Reflexes:  Right: Upper Extremity   Left: Upper extremity   biceps (C-5 to C-6) 2/4   biceps (C-5 to C-6) 2/4 tricep (C7) 2/4    triceps (C7) 2/4 Brachioradialis (C6) 2/4  Brachioradialis (C6) 2/4  Lower Extremity Lower Extremity  quadriceps (L-2 to L-4) 2/4   quadriceps (L-2 to L-4) 2/4 Achilles (S1) 2/4   Achilles (S1) 2/4  Plantars: Right: downgoing   Left: downgoing Cerebellar: normal finger-to-nose,  normal heel-to-shin test Gait:  No tested due to multiple leads    Results for orders placed or performed during the hospital encounter of 03/09/15 (from the past 48 hour(s))  CBC     Status: None   Collection Time: 03/09/15  5:13 PM  Result Value  Ref Range   WBC 10.4 4.0 - 10.5 K/uL   RBC 5.07 4.22 - 5.81 MIL/uL   Hemoglobin 14.6 13.0 - 17.0 g/dL   HCT 16.1 09.6 - 04.5 %   MCV 87.2 78.0 - 100.0 fL   MCH 28.8 26.0 - 34.0 pg   MCHC 33.0 30.0 - 36.0 g/dL   RDW 40.9 81.1 - 91.4 %   Platelets 245 150 - 400 K/uL  Differential     Status: None   Collection Time: 03/09/15  5:13 PM  Result Value Ref Range   Neutrophils Relative % 65 %   Neutro Abs 6.7 1.7 - 7.7 K/uL   Lymphocytes Relative 26 %   Lymphs Abs 2.7 0.7 - 4.0 K/uL   Monocytes Relative 8 %    Monocytes Absolute 0.9 0.1 - 1.0 K/uL   Eosinophils Relative 1 %   Eosinophils Absolute 0.1 0.0 - 0.7 K/uL   Basophils Relative 0 %   Basophils Absolute 0.0 0.0 - 0.1 K/uL  I-stat troponin, ED (not at [facility], [facility])     Status: None   Collection Time: 03/09/15  5:25 PM  Result Value Ref Range   Troponin i, poc 0.00 0.00 - 0.08 ng/mL   Comment 3            Comment: Due to the release kinetics of cTnI, a negative result within the first hours of the onset of symptoms does not rule out myocardial infarction with certainty. If myocardial infarction is still suspected, repeat the test at appropriate intervals.   I-Stat Chem 8, ED  (not at [facility], [facility])     Status: Abnormal   Collection Time: 03/09/15  5:27 PM  Result Value Ref Range   Sodium 141 135 - 145 mmol/L   Potassium 3.5 3.5 - 5.1 mmol/L   Chloride 104 101 - 111 mmol/L   BUN 21 (H) 6 - 20 mg/dL   Creatinine, Ser 7.82 0.61 - 1.24 mg/dL   Glucose, Bld 89 65 - 99 mg/dL   Calcium, Ion 9.56 2.13 - 1.23 mmol/L   TCO2 24 0 - 100 mmol/L   Hemoglobin 16.0 13.0 - 17.0 g/dL   HCT 08.6 57.8 - 46.9 %   Ct Head Wo Contrast  03/09/2015  CLINICAL DATA:  30 year old male with left-sided weakness, headache and nausea. Code stroke. EXAM: CT HEAD WITHOUT CONTRAST TECHNIQUE: Contiguous axial images were obtained from the base of the skull through the vertex without intravenous contrast. COMPARISON:  Brain MRI 10/30/2025 2016. FINDINGS: No acute intracranial abnormalities. Specifically, no evidence of acute intracranial hemorrhage, no definite findings of acute/subacute cerebral ischemia, no mass, mass effect, hydrocephalus or abnormal intra or extra-axial fluid collections. Visualized paranasal sinuses and mastoids are well pneumatized. No acute displaced skull fractures are identified. IMPRESSION: *No acute intracranial abnormalities. *The appearance of the brain is normal. These results were called by telephone at the time of interpretation on 03/09/2015  at 5:24 pm to Dr. Leroy Kennedy, who verbally acknowledged these results. Electronically Signed   By: Trudie Reed M.D.   On: 03/09/2015 17:26     Assessment: 30 y.o. male with recurrence of acute onset left sided numbness-weakness and very inconsistent neuro-exam. Concern of functional left hemiparesis arm>leg. Get DWI-MRI to further investigate his syndrome. It is worth noting that patient had similar presentation few weeks ago and had unrevealing MRI brain. Probably won't need admission to the hospital if MRI negative.  Will follow  Up.  Stroke Risk Factors -none   [address],  MD [facility] [phone]  03/09/2015, 5:42 PM

## 2015-03-09 NOTE — ED Provider Notes (Signed)
MSE was initiated and I personally evaluated the patient and placed orders (if any) at  5:08 PM on March 09, 2015.  The patient appears stable so that the remainder of the MSE may be completed by another provider.  30 year old male was coming to the ED because of bilateral flank pain when he noticed numbness in the left side of his face which extended into his left arm and left hand and he now has weakness in the left arm. He has known prior Bell's palsy which is still present but he has significant weakness of left arm and left leg.  Review of past records shows he had been admitted to the hospital in late December with similar symptoms and negative workup. However, with new neurologic findings, code stroke is activated.  Dione Booze, MD 03/09/15 [phone]

## 2015-03-09 NOTE — ED Notes (Addendum)
Rapid response documenting NIH and Stroke Log

## 2015-03-09 NOTE — ED Notes (Signed)
CBG taken by Denny Peon - RN. Pt's CBG result was 83.

## 2015-03-09 NOTE — ED Provider Notes (Signed)
CSN: [id number]     Arrival date & time 03/09/15  1658 History   First MD Initiated Contact with Patient 03/09/15 1726     Chief Complaint  Patient presents with  . Numbness    An emergency department physician performed an initial assessment on this suspected stroke patient at 30.  The history is provided by the patient.   patient states over the past 24 hours she's had bilateral flank pain he reports nausea and vomiting.  He believes the pain and discomfort is from his kidneys.  While on his way the emergency department he felt left arm numbness and weakness as well as a small amount of facial numbness.  He was seen and evaluated in triage and a code stroke was called as it was felt as though he was weak on his left side.  He was seen by neurology and taken for CT followed by MRI which demonstrates no acute stroke.  Patient reports he still having some bilateral flank pain this time.  He reports no longer is nauseated.  He denies chest pain shortness of breath.  No fevers or chills.  Denies dysuria or urinary frequency.  He reports no injury or trauma to his back.  No heavy lifting.  He reports his left arm and left leg and left face feeling better at this time.  He denies difficulty with speech.  No prior history of stroke.    Past Medical History  Diagnosis Date  . Reflux    Past Surgical History  Procedure Laterality Date  . Wrist surgery Left 2010  . Finger surgery Right 2002    R small finger   Family History  Problem Relation Age of Onset  . Hyperlipidemia Mother    Social History  Substance Use Topics  . Smoking status: Former Smoker    Types: Cigarettes  . Smokeless tobacco: None  . Alcohol Use: Yes     Comment: occasional    Review of Systems  All other systems reviewed and are negative.     Allergies  Review of patient's allergies indicates no known allergies.  Home Medications   Prior to Admission medications   Medication Sig Start Date End Date Taking?  Authorizing Provider  predniSONE (DELTASONE) 20 MG tablet Take 3 tablets once daily for 3 days, then take 2 tablets once daily for 2 days, then take 1 tablet once daily for 2 days, then stop. 02/15/15   Osvaldo Shipper, MD   BP 126/86 mmHg  Pulse 83  Temp(Src) 99 F (37.2 C) (Oral)  Resp 14  SpO2 99% Physical Exam  Constitutional: He is oriented to person, place, and time. He appears well-developed and well-nourished.  HENT:  Head: Normocephalic and atraumatic.  Eyes: EOM are normal.  Neck: Normal range of motion.  Cardiovascular: Normal rate, regular rhythm, normal heart sounds and intact distal pulses.   Pulmonary/Chest: Effort normal and breath sounds normal. No respiratory distress.  Abdominal: Soft. He exhibits no distension. There is no tenderness.  Musculoskeletal: Normal range of motion.  Mild bilateral paralumbar and parathoracic tenderness without rash or spasm noted.  No thoracic or lumbar point tenderness.  Neurological: He is alert and oriented to person, place, and time.  Mild weakness of the left upper extremity and left lower extreme is compared to the right.  He is able to hold both against gravity.  No obvious facial asymmetry.  Speech normal.  Skin: Skin is warm and dry.  Psychiatric: He has a normal mood and  affect. Judgment normal.  Nursing note and vitals reviewed.   ED Course  Procedures (including critical care time) Labs Review Labs Reviewed  I-STAT CHEM 8, ED - Abnormal; Notable for the following:    BUN 21 (*)    All other components within normal limits  PROTIME-INR  APTT  CBC  DIFFERENTIAL  COMPREHENSIVE METABOLIC PANEL  URINALYSIS, ROUTINE W REFLEX MICROSCOPIC (NOT AT [facility])  I-STAT TROPOININ, ED  CBG MONITORING, ED    Imaging Review Ct Head Wo Contrast  03/09/2015  CLINICAL DATA:  30 year old male with left-sided weakness, headache and nausea. Code stroke. EXAM: CT HEAD WITHOUT CONTRAST TECHNIQUE: Contiguous axial images were obtained from  the base of the skull through the vertex without intravenous contrast. COMPARISON:  Brain MRI 10/30/2025 2016. FINDINGS: No acute intracranial abnormalities. Specifically, no evidence of acute intracranial hemorrhage, no definite findings of acute/subacute cerebral ischemia, no mass, mass effect, hydrocephalus or abnormal intra or extra-axial fluid collections. Visualized paranasal sinuses and mastoids are well pneumatized. No acute displaced skull fractures are identified. IMPRESSION: *No acute intracranial abnormalities. *The appearance of the brain is normal. These results were called by telephone at the time of interpretation on 03/09/2015 at 5:24 pm to Dr. Leroy Kennedy, who verbally acknowledged these results. Electronically Signed   By: Trudie Reed M.D.   On: 03/09/2015 17:26   Mr Brain Wo Contrast  03/09/2015  CLINICAL DATA:  Stroke. Left face and arm numbness. Left arm weakness. History Bell's palsy. EXAM: MRI HEAD WITHOUT CONTRAST TECHNIQUE: Multiplanar, multiecho pulse sequences of the brain and surrounding structures were obtained without intravenous contrast. COMPARISON:  CT 03/09/2015, MRI 02/14/2015 FINDINGS: Ventricle size normal.  Cerebral volume normal. Negative for acute or chronic infarction Negative for demyelinating disease. Cerebral white matter is normal. Basal ganglia and brainstem normal. Negative for hemorrhage or fluid collection. Negative for mass or edema. No shift of the midline structures. Pituitary normal in size. Craniocervical junction normal. Normal orbit. Paranasal sinuses clear. IMPRESSION: Normal unenhanced MRI of the brain.  No change from prior studies. Electronically Signed   By: Marlan Palau M.D.   On: 03/09/2015 18:25   I have personally reviewed and evaluated these images and lab results as part of my medical decision-making.   EKG Interpretation   Date/Time:  Thursday March 09 2015 17:29:32 EST Ventricular Rate:  83 PR Interval:  146 QRS Duration: 103 QT  Interval:  373 QTC Calculation: 438 R Axis:   87 Text Interpretation:  Sinus rhythm No significant change was found  Confirmed by Koleton Duchemin  MD, Caryn Bee ([id number]) on 03/09/2015 6:33:10 PM      MDM   Final diagnoses:  Stroke with cerebral ischemia (HCC)    MRI without acute pathology.  Likely somatic in nature.  Abdominal exam is benign.  Back pain improving with pain medication.  His pain seems more musculoskeletal in nature.  I spoke with neurology who edition.  From a care follow-up.  Ambulatory.     Azalia Bilis, MD 03/10/15 [phone]

## 2015-03-09 NOTE — ED Notes (Signed)
Ambulated in hall.  Able to walk to desk and back.  Gait slow.  Able to pull legs back into bed on his own.

## 2015-03-09 NOTE — ED Notes (Signed)
Pt. Transported to MRI by rapid response RN and primary RN at this time.

## 2015-03-10 ENCOUNTER — Encounter: Payer: Self-pay | Admitting: Physical Therapy

## 2015-03-10 ENCOUNTER — Ambulatory Visit: Payer: BLUE CROSS/BLUE SHIELD | Admitting: Physical Therapy

## 2015-03-10 ENCOUNTER — Ambulatory Visit: Payer: BLUE CROSS/BLUE SHIELD | Admitting: Occupational Therapy

## 2015-03-10 DIAGNOSIS — R278 Other lack of coordination: Secondary | ICD-10-CM

## 2015-03-10 DIAGNOSIS — M6281 Muscle weakness (generalized): Secondary | ICD-10-CM | POA: Diagnosis not present

## 2015-03-10 DIAGNOSIS — R29898 Other symptoms and signs involving the musculoskeletal system: Secondary | ICD-10-CM

## 2015-03-10 DIAGNOSIS — R2681 Unsteadiness on feet: Secondary | ICD-10-CM

## 2015-03-10 DIAGNOSIS — R269 Unspecified abnormalities of gait and mobility: Secondary | ICD-10-CM

## 2015-03-10 DIAGNOSIS — R201 Hypoesthesia of skin: Secondary | ICD-10-CM

## 2015-03-10 NOTE — Patient Instructions (Signed)
Pelvic Tilt (Flexion)    With feet flat and knees bent, flatten lower back into bed. Tighten stomach muscles. Hold 5_ seconds. Repeat __10_ times. Do _1-2__ sessions per day.  http://gt2.exer.us/229   Copyright  VHI. All rights reserved.  Bridge    Lie back, legs bent. Lift hips up as high off bed as you can. Hold for 5 seconds.  Repeat __10__ times. Do _1-2_ sessions per day.  http://pm.exer.us/54   Copyright  VHI. All rights reserved.  Strengthening: Hip Abductor - Resisted    With band looped around both legs above knees, push thighs apart. Hold 5 seconds.  Repeat _10_ times per set.  Do __1-2__ sessions per day.  http://orth.exer.us/688   Copyright  VHI. All rights reserved.  Heel Squeeze (Prone)    Abdomen supported if needed with pillow, bend knees and gently squeeze heels together. Hold _5_ seconds. Repeat _10_ times per set.  Do __1-2__ sessions per day.  http://orth.exer.us/1080   Copyright  VHI. All rights reserved.  Bent Knee Lift (Prone)    Abdomen and head supported, bend left knee and slowly raise hip. Avoid arching low back. Repeat __10__ times each leg.Do __1-2__ sessions per day.  http://orth.exer.us/1110   Copyright  VHI. All rights reserved.  Opposite Arm / Leg Lift (Prone)    Abdomen and head supported, left knee locked, raise leg and opposite arm _2-3___ inches from floor/bed. Repeat __10__ times each side. Do _1-2_ sessions per day.  http://orth.exer.us/1114   Copyright  VHI. All rights reserved.  Knee-to-Chest Stretch: Bilateral    With hands behind knees, pull both knees in to chest until a comfortable stretch is felt in lower back and buttocks. Keep back relaxed. Hold _30__ seconds. Repeat _3_ times per set.  Do __1-2__ sessions per day.  http://orth.exer.us/128   Copyright  VHI. All rights reserved.  Lower Trunk Rotation Stretch    Keeping back flat and feet together, rotate knees to one side while looking the  other direction. Hold _30___ seconds. Repeat to other side. Repeat __3__ times each side.  Do __1-2__ sessions per day.  http://orth.exer.us/122   Copyright  VHI. All rights reserved.

## 2015-03-10 NOTE — Patient Instructions (Signed)
  Strengthening: Resisted Flexion   Hold tubing with __left___ arm(s) at side. Pull forward and up. Move shoulder through pain-free range of motion. Repeat __15__ times per set.  Do _1-2_ sessions per day , every other day   Strengthening: Resisted Extension   Hold tubing in __left ___ hand(s), arm forward. Pull arm back, elbow straight. Repeat _15__ times per set. Do _1-2___ sessions per day, every other day.   Resisted Horizontal Abduction: Bilateral   Sit or stand, tubing in both hands, arms out in front. Keeping arms straight, pinch shoulder blades together and stretch arms out. Repeat _15___ times per set. Do _1-2___ sessions per day, every other day.   Elbow Flexion: Resisted   With tubing held in ______ hand(s) and other end secured under foot, curl arm up as far as possible. Repeat _10___ times per set. Do _1-2___ sessions per day, every other day.    Elbow Extension: Resisted   Sit in chair with resistive band secured at armrest (or hold with other hand) and ___left ____ elbow bent. Straighten elbow. Repeat _10___ times per set.  Do _1-2___ sessions per day, every other day.   Copyright  VHI. All rights reserved.

## 2015-03-10 NOTE — Therapy (Signed)
[facility] [address] [place], [place], [address] Phone: [phone]   Fax:  [phone]  Occupational Therapy Treatment  Patient Details  Name: Hawk Mones MRN: [id number] Date of Birth: [birth year] Referring Provider: Dr. Lyn Records  Encounter Date: 03/10/2015      OT End of Session - 03/10/15 0843    Authorization Type [employer] - no visit limitations   OT Start Time [phone]  pt arrived late   OT Stop Time 0845   OT Time Calculation (min) 34 min      Past Medical History  Diagnosis Date  . Reflux     Past Surgical History  Procedure Laterality Date  . Wrist surgery Left 2010  . Finger surgery Right 2002    R small finger    There were no vitals filed for this visit.  Visit Diagnosis:  Coordination impairment  Unsteadiness  Decreased grip strength of left hand  Muscle weakness of left upper extremity  Impaired sensation      Subjective Assessment - 03/10/15 0813    Subjective  Pt reports returning to work yesterday, he reports back pain   Pertinent History see epic. pt with facial weakness (Bell's palsey) and LUE numbness starting 02/13/2015.  Hospitalized 02/13/15-12/28-16, No definitive diagnosis beyond Bell's Palsey   Patient Stated Goals I want my face to feel normal again   Currently in Pain? Yes   Pain Score 6    Pain Location Back   Pain Descriptors / Indicators Sharp   Pain Type Acute pain   Pain Onset More than a month ago   Pain Frequency Intermittent   Aggravating Factors  standing , working   Pain Relieving Factors rest   Multiple Pain Sites No          Treatment: Reviewed previously issued red theraputty HEP, pt returned demonstration. Green theraband HEP issued 15 reps each, min v.c.  Arm bike x 5 mins level 6 for conditioning. Pt c/o of some numbness in left arm at work yesterday. He reports seeing MD regarding back pain yesterday.                     OT Education - 03/10/15 [phone]    Education provided Yes   Education Details putty ex reviewed, green theraband   Person(s) Educated Patient   Methods Explanation;Demonstration   Comprehension Verbalized understanding;Returned demonstration             OT Long Term Goals - 02/21/15 1612    OT LONG TERM GOAL #1   Title Pt will be mod I with HEP to strengthen R grip strength to assist with functional tasks.    Status New   OT LONG TERM GOAL #2   Title Pt will be mod i with HEP to strengthen LUE proximal strength to assist with funcitonal tasks.    Status New   OT LONG TERM GOAL #3   Title Pt will verbalize understanding on how to upgrade HEP.    Status New               Plan - 03/10/15 0102    Clinical Impression Statement Pt demonstrates understanding of previously issued putty HEP, and greeen theraband HEP. Pt requested to schedule 1 additional appointment next week due to missed appts. Anticipate d/c next visit.   Pt will benefit from skilled therapeutic intervention in order to improve on the following deficits (Retired) Decreased strength;Impaired sensation;Impaired perceived functional ability;Impaired UE functional use;Pain  Rehab Potential Good   OT Frequency 2x / week   OT Duration 2 weeks   OT Treatment/Interventions Therapeutic exercise;Therapeutic activities;Patient/family education   Plan review green theraband HEP, consider upgrading putty, anticipate d/c next visit.   Consulted and Agree with Plan of Care Patient        Problem List Patient Active Problem List   Diagnosis Date Noted  . Weakness 02/13/2015  . Facial palsy 02/13/2015    Emani Morad 03/10/2015, 8:43 AM Keene Breath, OTR/L [phone] Phone: [phone] 8:44 AM 03/10/2015 [facility] [address] [place], [place], [address] Phone: [phone]   Fax:  [phone]  Name: Nic Lampe MRN:  [id number] Date of Birth: [birth year]

## 2015-03-10 NOTE — Therapy (Signed)
[facility] [address] [place], [place], [address] Phone: [phone]   Fax:  [phone]  Physical Therapy Treatment  Patient Details  Name: Richard Ramirez MRN: [id number] Date of Birth: Mar 18, 1985 Referring Provider: Leilani Able, MD  Encounter Date: 03/10/2015   03/10/15 0854  PT Visits / Re-Eval  Visit Number 3  Number of Visits 13 (eval + 12 visits)  Date for PT Re-Evaluation 04/03/15  Authorization  Authorization Type BCBS  PT Time Calculation  PT Start Time 0848  PT Stop Time 0930  PT Time Calculation (min) 42 min  PT - End of Session  Equipment Utilized During Treatment Gait belt  Activity Tolerance Patient tolerated treatment well  Behavior During Therapy Anxious      Past Medical History  Diagnosis Date  . Reflux     Past Surgical History  Procedure Laterality Date  . Wrist surgery Left 2010  . Finger surgery Right 2002    R small finger    There were no vitals filed for this visit.  Visit Diagnosis:   Abnormality of gait     Coordination impairment     Unsteadiness         Subjective Assessment - 03/10/15 0849    Subjective Reports he went to the ED yesterday due to lower back pain that radiated across both his lower side. Also that he had the left sided numbness again. Was cleared by ED doctor for stroke or other neuro/medical issues. They felt it was more musculoskeletal in nature (per ED md reports in epic).                                                                    Pertinent History Acute onset of L facial numbness, LUE numbness/weakness. CT/MRI negative. 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    Patient Stated Goals "To get back to work and to get more strength in my legs, and for my face to stop hurting."   Currently in Pain? Yes   Pain Score 8    Pain Location Back   Pain Orientation  Right;Left;Lower   Pain Descriptors / Indicators Aching;Sore;Tender;Throbbing;Sharp   Pain Type Acute pain   Pain Onset More than a month ago   Pain Frequency Intermittent   Aggravating Factors  standing, working   Pain Relieving Factors rest     Exercises: Hook lying: - posterior pelvic tilt, 5 sec hold x 10 reps - bridge 5 sec hold x 10 reps - hip abduction/ER with green thera band, 5 sec hold x 10 reps - heel squeezes, 5 sec's x 10 reps - prone glut kick backs x 10 reps each leg - prone alternating combo contralateral leg/UE raises x 10 each side - double knee to chest stretch, 30 sec holds x 3 reps - lower trunk rotation left<>right, 30 sec holds x 3 reps each side  * all the above issued for HEP        PT Education - 03/11/15 1934    Education provided Yes   Education Details HEP: for strengthening and flexibility   Person(s) Educated Patient   Methods Explanation;Demonstration;Handout;Verbal cues   Comprehension Verbalized understanding;Returned demonstration;Need further instruction  PT Short Term Goals - 02/17/15 2022    PT SHORT TERM GOAL #5   Title Pt will ambulate x500' over unlevel, paved surfaces with mod I using LRAD to indicate increased safety with limited community distances.  Target date: 03/17/15           PT Long Term Goals - 02/17/15 2022    PT LONG TERM GOAL #1   Title Pt will improve Berg score by 8 points from baseline to indicate significant improvement in functional standing balance. Target date: 03/31/15   PT LONG TERM GOAL #2   Title Pt will improve gait velocity from 0.39 ft/sec to > 1.0 ft/sec to indicate increased efficiency of ambulation.  Target date: 03/31/15   PT LONG TERM GOAL #3   Title Pt will improve 5X STS time from 49.38 sec to < 39 seconds to indicate increased efficiency of functional transfers. Target date: 03/31/15   PT LONG TERM GOAL #4   Title Pt will ambulate > 1,000' over unlevel, paved surfaces with mod I  using LRAD to indicate increased safety with community distances.  Target date: 03/31/15   PT LONG TERM GOAL #5   Title Pt will negotiate standard ramp and curb step with mod I using LRAD to indicate increased safety traversing community obstacles. Target date: 03/31/15   Additional Long Term Goals   Additional Long Term Goals Yes   PT LONG TERM GOAL #6   Title Pt will independently negotiate 4 steps with single rail to indicate increased community access. Target date: 03/31/15   PT LONG TERM GOAL #7   Title Pt will verbalize plans for exercise at gym to facilitate safe transition to community fitness, progress toward PLOF. Target date: 03/31/15        03/10/15 0854  Plan  Clinical Impression Statement Educated pt on exercises and stretching for core/LE strengthening and flexibilty to assist with decreased back pain. Discussed with pt results of balance and gait tests from previous sessions and that based on progress to date anticipate only needing 1-2 more weeks to address any balance issues and complete education on a comprehensive HEP. Pt verbalized understanding. Also discussed return to communtiy fitness (pt used to do yoga) and how that will also help him with getting his strength/endurance back, while decreasing pain. Pt verbalized understanding of this as well.                                    Pt will benefit from skilled therapeutic intervention in order to improve on the following deficits Pain;Other (comment);Abnormal gait;Impaired flexibility;Decreased coordination;Decreased balance;Decreased knowledge of use of DME;Decreased endurance;Difficulty walking (PT will monitor pain but will not directly address due to nature of facial pain.)  Rehab Potential Good  Clinical Impairments Affecting Rehab Potential No definitive diagnosis  PT Frequency 2x / week  PT Duration 6 weeks  PT Treatment/Interventions ADLs/Self Care Home Management;DME Instruction;Balance training;Therapeutic  exercise;Therapeutic activities;Patient/family education;Functional mobility training;Neuromuscular re-education;Stair training;Gait training;Orthotic Fit/Training;Manual techniques;Passive range of motion;Vestibular  PT Next Visit Plan Work on high level balance, gait on compliant surfaces toward goals  Consulted and Agree with Plan of Care Patient       Problem List Patient Active Problem List   Diagnosis Date Noted  . Weakness 02/13/2015  . 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

## 2015-03-13 ENCOUNTER — Encounter: Payer: BLUE CROSS/BLUE SHIELD | Admitting: Occupational Therapy

## 2015-03-13 ENCOUNTER — Ambulatory Visit: Payer: BLUE CROSS/BLUE SHIELD | Admitting: Physical Therapy

## 2015-03-13 ENCOUNTER — Encounter: Payer: Self-pay | Admitting: Occupational Therapy

## 2015-03-13 ENCOUNTER — Ambulatory Visit: Payer: BLUE CROSS/BLUE SHIELD | Admitting: Occupational Therapy

## 2015-03-13 ENCOUNTER — Encounter: Payer: Self-pay | Admitting: Physical Therapy

## 2015-03-13 DIAGNOSIS — R269 Unspecified abnormalities of gait and mobility: Secondary | ICD-10-CM

## 2015-03-13 DIAGNOSIS — R278 Other lack of coordination: Secondary | ICD-10-CM

## 2015-03-13 DIAGNOSIS — R201 Hypoesthesia of skin: Secondary | ICD-10-CM

## 2015-03-13 DIAGNOSIS — R2681 Unsteadiness on feet: Secondary | ICD-10-CM

## 2015-03-13 DIAGNOSIS — M6281 Muscle weakness (generalized): Secondary | ICD-10-CM | POA: Diagnosis not present

## 2015-03-13 DIAGNOSIS — R29898 Other symptoms and signs involving the musculoskeletal system: Secondary | ICD-10-CM

## 2015-03-13 NOTE — Therapy (Signed)
[facility] [address] [place], [place], [address] Phone: [phone]   Fax:  [phone]  Physical Therapy Treatment  Patient Details  Name: Richard Ramirez MRN: [id number] Date of Birth: February 04, 1986 Referring Provider: Lin Landsman, MD  Encounter Date: 03/13/2015      PT End of Session - 03/13/15 0849    Visit Number 4   Number of Visits 13  eval + 12 visits   Date for PT Re-Evaluation 04/03/15   Authorization Type BCBS   PT Start Time [id number]   PT Stop Time 0928   PT Time Calculation (min) 42 min   Equipment Utilized During Treatment Gait belt   Activity Tolerance Patient tolerated treatment well   Behavior During Therapy Anxious      Past Medical History  Diagnosis Date  . Reflux     Past Surgical History  Procedure Laterality Date  . Wrist surgery Left 2010  . Finger surgery Right 2002    R small finger    There were no vitals filed for this visit.  Visit Diagnosis:  Unsteadiness  Coordination impairment  Abnormality of gait      Subjective Assessment - 03/13/15 0848    Subjective No new complaints. Started back at the gym yesterday. Feels he may have overworked himself/over stretched himself.   Pertinent History Acute onset of L facial numbness, LUE numbness/weakness. CT/MRI negative. 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    Patient Stated Goals "To get back to work and to get more strength in my legs, and for my face to stop hurting."   Currently in Pain? Yes   Pain Score 6    Pain Location Back   Pain Orientation Right;Left;Lower   Pain Descriptors / Indicators Aching;Sore;Tightness   Pain Type Chronic pain   Pain Onset More than a month ago   Pain Frequency Intermittent   Aggravating Factors  standing, working   Pain Relieving Factors rest          [facility] Adult PT Treatment/Exercise - 03/13/15 0001    Transfers   Sit to Stand 6: Modified independent (Device/Increase time)   Five time sit to stand comments  14.87  from high/low mat at lowest setting, no UE support   Stand to Sit 5: Supervision   Stand to Sit Details (indicate cue type and reason) Verbal cues for precautions/safety  cues to ensure he is all the way to the surface   Ambulation/Gait   Ambulation/Gait Yes   Ambulation/Gait Assistance 5: Supervision;6: Modified independent (Device/Increase time)   Ambulation Distance (Feet) 850 Feet   Assistive device None   Gait Pattern Step-through pattern;Decreased stride length;Trunk flexed;Decreased arm swing - right;Decreased arm swing - left   Ambulation Surface Level;Indoor   Gait velocity 9.60 sec= 3.45 ft/sec   Ramp 6: Modified independent (Device)  no AD   Curb 6: Modified independent (Device/increase time)  no AD   High Level Balance   High Level Balance Activities Braiding;Marching forwards;Marching backwards;Tandem walking  tandem gait fwd/bwd, heel/toe walk fwd/bwd   High Level Balance Comments at counter top with no UE support needed, 3 laps each/each way   Knee/Hip Exercises: Stretches   Active Hamstring Stretch Both;3 reps;30 seconds;Limitations   Active Hamstring Stretch Limitations seated at edge of mat   Knee/Hip Exercises: Seated   Sit to Sand 20 reps;without UE support  2 sets of 10 reps, cues for full upright posture  Balance Exercises - 03/13/15 0911    Balance Exercises: Standing   SLS with Vectors Solid surface;Other reps (comment);Limitations   Balance Exercises: Standing   SLS with Vectors Limitations cones on floor: alternating fwd toe taps, cross toe taps, fwd double toe taps and cross double toe taps x 10 reps each bil legs: repeated on blue mat. all with min guard assist to supervision assist for balance. cues on posture and weight shifting for stance stability.           PT Education - 03/13/15 0907    Education provided Yes    Education Details HEP: counter top balance activities   Person(s) Educated Patient   Methods Explanation;Demonstration;Handout;Verbal cues   Comprehension Verbalized understanding;Returned demonstration         PT Short Term Goals - 03/13/15 1031    PT SHORT TERM GOAL #1   Title Pt will independently perform initial HEP to maximize functional gains made in PT. Target date: 03/17/15   Status On-going   PT SHORT TERM GOAL #2   Title Complete Berg and improve score by 4 points from baseline to progress toward significant improvement in functional standing balance. Target date: 03/17/15   Status On-going   PT SHORT TERM GOAL #3   Title Pt will improve gait velocity from 0.39 ft/sec to 0.69 ft/sec to indicate increased efficiency of ambulation.Target date: 03/17/15   Status Achieved   PT SHORT TERM GOAL #4   Title Pt will improve 5X STS time from 49.38 sec to < 44 seconds to indicate increased efficiency of functional transfers. Target date: 03/17/15   Status Achieved   PT SHORT TERM GOAL #5   Title Pt will ambulate x500' over unlevel, paved surfaces with mod I using LRAD to indicate increased safety with limited community distances.  Target date: 03/17/15   Status On-going            PT Long Term Goals - 03/13/15 0859    PT LONG TERM GOAL #1   Title Pt will improve Berg score by 8 points from baseline to indicate significant improvement in functional standing balance. Target date: 03/31/15   PT LONG TERM GOAL #2   Title Pt will improve gait velocity from 0.39 ft/sec to > 1.0 ft/sec to indicate increased efficiency of ambulation.  Target date: 03/31/15   Status Achieved   PT LONG TERM GOAL #3   Title Pt will improve 5X STS time from 49.38 sec to < 39 seconds to indicate increased efficiency of functional transfers. Target date: 03/31/15   PT LONG TERM GOAL #4   Title Pt will ambulate > 1,000' over unlevel, paved surfaces with mod I using LRAD to indicate increased safety with community  distances.  Target date: 03/31/15   PT LONG TERM GOAL #5   Title Pt will negotiate standard ramp and curb step with mod I using LRAD to indicate increased safety traversing community obstacles. Target date: 03/31/15   Status Achieved   PT LONG TERM GOAL #6   Title Pt will independently negotiate 4 steps with single rail to indicate increased community access. Target date: 03/31/15   PT LONG TERM GOAL #7   Title Pt will verbalize plans for exercise at gym to facilitate safe transition to community fitness, progress toward PLOF. Target date: 03/31/15   Status Achieved               Plan - 03/13/15 0849    Clinical Impression Statement Pt has met 2 STGs  and some LTGs as well . Progressing towards all others as well.   Pt will benefit from skilled therapeutic intervention in order to improve on the following deficits Pain;Other (comment);Abnormal gait;Impaired flexibility;Decreased coordination;Decreased balance;Decreased knowledge of use of DME;Decreased endurance;Difficulty walking  PT will monitor pain but will not directly address due to nature of facial pain.   Rehab Potential Good   Clinical Impairments Affecting Rehab Potential No definitive diagnosis   PT Frequency 2x / week   PT Duration 6 weeks   PT Treatment/Interventions ADLs/Self Care Home Management;DME Instruction;Balance training;Therapeutic exercise;Therapeutic activities;Patient/family education;Functional mobility training;Neuromuscular re-education;Stair training;Gait training;Orthotic Fit/Training;Manual techniques;Passive range of motion;Vestibular   PT Next Visit Plan assess remaining STGs/LTGs, finalize HEP and discharge over next 2 visists if goals met and appropriate   Consulted and Agree with Plan of Care Patient        Problem List Patient Active Problem List   Diagnosis Date Noted  . Weakness 02/13/2015  . 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

## 2015-03-13 NOTE — Patient Instructions (Signed)
How to increase the challenge of your home program:  Theraputty: You now have the blue putty which is the strongest. You can increase the challenge by: 1.  Increase the reps of the activities you are doing (increase by 1-2 reps at a time) 2. Increase the number of times per week you do the program (i.e if you are doing 1 time per day could do 2 times per day)  Bands: You have now been issued the blue theraband.  You can increase the challenge by: 1. Increasing the number of repetitions of the exercises (increase the reps by 3 at time) 2. Increase the number of times per week you do the program.  Do not increase both at the same time. If you increase the reps, try and pick just one or two at a time to increase - do not increase all the activities at once.   If your muscles get sore, cut back on the reps temporarily.

## 2015-03-13 NOTE — Therapy (Signed)
[facility] [address] [place], [place], [address] Phone: [phone]   Fax:  [phone]  Occupational Therapy Treatment  Patient Details  Name: Richard Ramirez MRN: [id number] Date of Birth: Dec 31, 1985 Referring Provider: Dr. Duane Boston  Encounter Date: 03/13/2015      OT End of Session - 03/13/15 1006    Visit Number 3   Number of Visits 4   Date for OT Re-Evaluation 03/21/15   Authorization Type BCBS - no visit limitations   OT Start Time 0928   OT Stop Time 1002   OT Time Calculation (min) 34 min   Activity Tolerance Patient tolerated treatment well      Past Medical History  Diagnosis Date  . Reflux     Past Surgical History  Procedure Laterality Date  . Wrist surgery Left 2010  . Finger surgery Right 2002    R small finger    There were no vitals filed for this visit.  Visit Diagnosis:  Decreased grip strength of left hand  Muscle weakness of left upper extremity  Impaired sensation      Subjective Assessment - 03/13/15 0930    Pertinent History see epic. pt with facial weakness (Bell's palsey) and LUE numbness starting 02/13/2015.  Hospitalized 02/13/15-12/28-16, No definitive diagnosis beyond Bell's Palsey   Patient Stated Goals I want my face to feel normal again   Currently in Pain? Yes   Pain Score 6    Pain Location Back   Pain Orientation Right;Left;Lower   Pain Descriptors / Indicators Aching;Sore;Tightness   Pain Type Chronic pain   Pain Onset More than a month ago   Aggravating Factors  standing, working   Pain Relieving Factors rest                      OT Treatments/Exercises (OP) - 03/13/15 0947    Exercises   Exercises Hand   Hand Exercises   Theraputty Locate Pegs  using blue putty   Theraputty - Flatten Upgraded HEP from red putty to blue putty.     Hand Gripper with Small Beads Pt able to stack and unstack blocks of 4 with 70 # of resistance with  little difficulty    Other Hand Exercises Pt stated that theraband program for HEP was going well and he felt he could use more resistance. Pt issued blue theraband and able to complete exercises.  Pt also provided with instructions in writing on how to increase HEP challenge. After review, pt able to verbalize understanding..               Balance Exercises - 03/13/15 0911    Balance Exercises: Standing   SLS with Vectors Solid surface;Other reps (comment);Limitations   Balance Exercises: Standing   SLS with Vectors Limitations cones on floor: alternating fwd toe taps, cross toe taps, fwd double toe taps and cross double toe taps x 10 reps each bil legs: repeated on blue mat. all with min guard assist to supervision assist for balance. cues on posture and weight shifting for stance stability.           OT Education - 03/13/15 1002    Education provided Yes   Education Details how to upgrade HEP for LUE.  Pt also issued blue putty and blue theraband   Person(s) Educated Patient   Methods Explanation;Handout   Comprehension Verbalized understanding             OT Long Term  Goals - 03/13/15 1002    OT LONG TERM GOAL #1   Title Pt will be mod I with HEP to strengthen L grip strength to assist with functional tasks.    Status Achieved  using blue putty   OT LONG TERM GOAL #2   Title Pt will be mod i with HEP to strengthen LUE proximal strength to assist with funcitonal tasks.    Status Achieved  using blue theraband   OT LONG TERM GOAL #3   Title Pt will verbalize understanding on how to upgrade HEP.    Status Achieved               Plan - 03/13/15 1003    Clinical Impression Statement Pt has met all LTG's and is ready for d/c from OT. Pt in agreement.    Pt will benefit from skilled therapeutic intervention in order to improve on the following deficits (Retired) Decreased strength;Impaired sensation;Impaired perceived functional ability;Impaired UE functional  use;Pain   Rehab Potential Good   Clinical Impairments Affecting Rehab Potential impaired perception of LUE functional use   OT Frequency 2x / week   OT Duration 2 weeks   OT Treatment/Interventions Therapeutic exercise;Therapeutic activities;Patient/family education   Plan d/c from [facility] upgraded to blue putty and blue theraband   Consulted and Agree with Plan of Care Patient        Problem List Patient Active Problem List   Diagnosis Date Noted  . Weakness 02/13/2015  . 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  Patient goals were met. Patient is being discharged due to meeting the stated rehab goals.  ?????      Quay Burow, OTR/L 03/13/2015, 10:07 AM  [facility] [address] [place], [place], [address] Phone: [phone]   Fax:  [phone]  Name: Richard Ramirez MRN: [id number] Date of Birth: 09-Nov-1985

## 2015-03-13 NOTE — Patient Instructions (Signed)
"  I love a Licensed conveyancer   At counter for balance as needed: high knee marching forward and then backward. 3 second pauses with each knee lift.  Repeat 3 laps each way. Do _1-2_ sessions per day. http://gt2.exer.us/345   Copyright  VHI. All rights reserved.  Walking on Heels   At counter: Walk on heels forward while continuing on a straight path, and then walk on heels backward to starting position. Repeat for 3 laps each way. Do _1-2__ sessions per day.  Copyright  VHI. All rights reserved.  Walking on Toes   At counter for balance as needed: Walk on toes forward while continuing on a straight path, and then backwards on toes to starting position. Repeat 3 laps each way. Do _1-2___ sessions per day.  Copyright  VHI. All rights reserved.  Feet Heel-Toe "Tandem"   At counter: Arms at sides, walk a straight line forward bringing one foot directly in front of the other, and then a straight line backwards bringing one foot directly behind the other one.  Repeat for _3 laps each way. Do _1-2_ sessions per day.  Copyright  VHI. All rights reserved.  Braiding   At counter for  Balance as needed: walking sideways with following pattern "front, step, back, step" along counter, repeat the pattern the other way to start position. Repeat 3 laps each direction. Do __1-2__ sessions per day.  Copyright  VHI. All rights reserved.

## 2015-03-15 ENCOUNTER — Ambulatory Visit: Payer: BLUE CROSS/BLUE SHIELD | Admitting: Physical Therapy

## 2015-03-15 DIAGNOSIS — R269 Unspecified abnormalities of gait and mobility: Secondary | ICD-10-CM

## 2015-03-15 DIAGNOSIS — R2681 Unsteadiness on feet: Secondary | ICD-10-CM

## 2015-03-15 DIAGNOSIS — M6281 Muscle weakness (generalized): Secondary | ICD-10-CM | POA: Diagnosis not present

## 2015-03-15 NOTE — Therapy (Signed)
[facility] [address] [place], [place], [address] Phone: [phone]   Fax:  [phone]  Physical Therapy Treatment  Patient Details  Name: Richard Ramirez MRN: [id number] Date of Birth: [birth year] Referring Provider: Lin Landsman, MD  Encounter Date: 03/15/2015      PT End of Session - 03/15/15 1322    Visit Number 5   Number of Visits 13   Date for PT Re-Evaluation 04/03/15   Authorization Type [employer]   PT Start Time 1107   PT Stop Time 1140  Ended early due to all goals met, pt discharged   PT Time Calculation (min) 33 min   Activity Tolerance Patient tolerated treatment well   Behavior During Therapy Anxious      Past Medical History  Diagnosis Date  . Reflux     Past Surgical History  Procedure Laterality Date  . Wrist surgery Left 2010  . Finger surgery Right 2002    R small finger    There were no vitals filed for this visit.  Visit Diagnosis:  Abnormality of gait  Unsteadiness      Subjective Assessment - 03/15/15 1108    Subjective "I told my boss that I need to go back to my normal routine. They were giving me light duty; and I told him that I need to move around more."   Pertinent History Acute onset of L facial numbness, LUE numbness/weakness. CT/MRI negative. 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    Patient Stated Goals "To get back to work and to get more strength in my legs, and for my face to stop hurting."   Currently in Pain? Yes   Pain Score 4    Pain Location Back   Pain Orientation Mid   Pain Descriptors / Indicators Aching   Pain Type Chronic pain   Pain Onset More than a month ago   Pain Frequency Intermittent   Aggravating Factors  working   Pain Relieving Factors rest; sitting upright (improved posture)   Multiple Pain Sites Yes   Pain Score 7   Pain Location Knee   Pain Orientation  Right;Left   Pain Descriptors / Indicators Aching   Pain Type Chronic pain   Pain Onset More than a month ago   Pain Frequency Intermittent   Aggravating Factors  working; picking up heavy things   Pain Relieving Factors rest                         [facility] Adult PT Treatment/Exercise - 03/15/15 0001    Ambulation/Gait   Stairs Yes   Stairs Assistance 6: Modified independent (Device/Increase time);5: Supervision   Stairs Assistance Details (indicate cue type and reason) Required cueing for step-to pattern with RLE leading to ascend, LLE leading to descend with effective return demo during remainder of trials.   Number of Stairs 12  4 stairs x3 trials   Height of Stairs 6   Berg Balance Test   Sit to Stand Able to stand without using hands and stabilize independently   Standing Unsupported Able to stand safely 2 minutes   Sitting with Back Unsupported but Feet Supported on Floor or Stool Able to sit safely and securely 2 minutes   Stand to Sit Sits safely with minimal use of hands   Transfers Able to transfer safely, minor use of hands   Standing Unsupported with Eyes Closed Able to  stand 10 seconds safely   Standing Ubsupported with Feet Together Able to place feet together independently and stand 1 minute safely   From Standing, Reach Forward with Outstretched Arm Can reach confidently >25 cm (10")   From Standing Position, Pick up Object from [place] to pick up shoe safely and easily   From Standing Position, Turn to Look Behind Over each Shoulder Looks behind from both sides and weight shifts well   Turn 360 Degrees Able to turn 360 degrees safely in 4 seconds or less   Standing Unsupported, Alternately Place Feet on Step/Stool Able to stand independently and safely and complete 8 steps in 20 seconds   Standing Unsupported, One Foot in [place] to place foot tandem independently and hold 30 seconds   Standing on One Leg Able to lift leg independently and hold >  10 seconds  on LLE   Total Score 56             Balance Exercises - 03/15/15 1140    Balance Exercises: Standing   Tandem Gait Forward;Retro;1 rep  x15'   Marching Limitations gait with high knees without UE support x20' with 3-sec holds in SLS.   Other Standing Exercises Heel/toe walking x20' each without UE support, no LOB; LE braiding without UE support x20' with each LE leading without LOB           PT Education - 03/15/15 1144    Education provided Yes   Education Details Goals, findings, progress, and DC plan. Stair negotiation to mitigate L knee pain.   Person(s) Educated Patient   Methods Explanation;Demonstration   Comprehension Verbalized understanding;Returned demonstration          PT Short Term Goals - 03/15/15 1113    PT SHORT TERM GOAL #1   Title Pt will independently perform initial HEP to maximize functional gains made in PT. Target date: 03/17/15   Baseline Met 1/25.   Status Achieved   PT SHORT TERM GOAL #2   Title Complete Berg and improve score by 4 points from baseline to progress toward significant improvement in functional standing balance. Target date: 03/17/15   Baseline Berg score = 53/56 on 1/17, 56/56 on 1/25   Status Achieved   PT SHORT TERM GOAL #3   Title Pt will improve gait velocity from 0.39 ft/sec to 0.69 ft/sec to indicate increased efficiency of ambulation.Target date: 03/17/15   Baseline 1/23: gait velocity = 3.45 ft/sec   Status Achieved   PT SHORT TERM GOAL #4   Title Pt will improve 5X STS time from 49.38 sec to < 44 seconds to indicate increased efficiency of functional transfers. Target date: 03/17/15   Baseline 1/23: 14.87 seconds   Status Achieved   PT SHORT TERM GOAL #5   Title Pt will ambulate x500' over unlevel, paved surfaces with mod I using LRAD to indicate increased safety with limited community distances.  Target date: 03/17/15   Baseline Met 1/25, as pt ambulated > 1,400' over unlevel paved, grass, and pinestraw  independently.   Status Achieved           PT Long Term Goals - 03/15/15 1115    PT LONG TERM GOAL #1   Title Pt will improve Berg score by 8 points from baseline to indicate significant improvement in functional standing balance. Target date: 03/31/15   Baseline 1/25: Merrilee Jansky = 56/56   Status Achieved   PT LONG TERM GOAL #2   Title Pt will improve gait  velocity from 0.39 ft/sec to > 1.0 ft/sec to indicate increased efficiency of ambulation.  Target date: 03/31/15   Baseline Per documentation on 1/23, gait velocity = 3.45 ft/sec   Status Achieved   PT LONG TERM GOAL #3   Title Pt will improve 5X STS time from 49.38 sec to < 39 seconds to indicate increased efficiency of functional transfers. Target date: 03/31/15   Baseline Per documentaiton on 1/23, 5X STS = 14.87 seconds   Status Achieved   PT LONG TERM GOAL #4   Title Pt will ambulate > 1,000' over unlevel, paved surfaces with mod I using LRAD to indicate increased safety with community distances.  Target date: 03/31/15   Baseline 1/25: Pt ambulated > 1,400' over unlevel paved surfaces, grass, and pinestraw independently with no overt LOB.   Status Achieved   PT LONG TERM GOAL #5   Title Pt will negotiate standard ramp and curb step with mod I using LRAD to indicate increased safety traversing community obstacles. Target date: 03/31/15   Baseline 1/25: independent with negotiation of curb step and ramp.   Status Achieved   PT LONG TERM GOAL #6   Title Pt will independently negotiate 4 steps with single rail to indicate increased community access. Target date: 03/31/15   Baseline Met 1/25.   Status Achieved   PT LONG TERM GOAL #7   Title Pt will verbalize plans for exercise at gym to facilitate safe transition to community fitness, progress toward PLOF. Target date: 03/31/15   Baseline Per documentation on 1/23, pt has returned to gym and is performing workout regimen regularly.   Status Achieved               Plan - 03/15/15  1325    Clinical Impression Statement Pt has met all short and long term goals and therefore will be discharged from outpatient PT at this time. Educated pt on goals, findings, progress, and DC plan with verbal understanding from pt.   Consulted and Agree with Plan of Care Patient        Problem List Patient Active Problem List   Diagnosis Date Noted  . Weakness 02/13/2015  . 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    Education / Equipment: HEP and progression.  Plan: Patient agrees to discharge.  Patient goals were met. Patient is being discharged due to meeting the stated rehab goals.  ?????       Billie Ruddy, PT, [facility] [address] [place], [place], [address] Phone: [phone]   Fax:  [phone] 03/15/2015, 1:28 PM   Name: Richard Ramirez MRN: [id number] Date of Birth: 1985/10/19

## 2015-03-17 ENCOUNTER — Encounter: Payer: BLUE CROSS/BLUE SHIELD | Admitting: Occupational Therapy

## 2015-03-17 ENCOUNTER — Ambulatory Visit: Payer: BLUE CROSS/BLUE SHIELD | Admitting: Physical Therapy

## 2015-03-22 ENCOUNTER — Ambulatory Visit: Payer: BLUE CROSS/BLUE SHIELD | Admitting: Physical Therapy

## 2015-03-22 ENCOUNTER — Encounter: Payer: BLUE CROSS/BLUE SHIELD | Admitting: Occupational Therapy

## 2015-03-23 ENCOUNTER — Encounter: Payer: BLUE CROSS/BLUE SHIELD | Admitting: Occupational Therapy

## 2015-03-23 ENCOUNTER — Ambulatory Visit: Payer: BLUE CROSS/BLUE SHIELD | Admitting: Physical Therapy

## 2015-06-05 ENCOUNTER — Emergency Department (HOSPITAL_COMMUNITY)
Admission: EM | Admit: 2015-06-05 | Discharge: 2015-06-05 | Disposition: A | Discharge status: Home / Self Care | Payer: BLUE CROSS/BLUE SHIELD | Attending: Emergency Medicine | Admitting: Emergency Medicine

## 2015-06-05 ENCOUNTER — Encounter (HOSPITAL_COMMUNITY): Payer: Self-pay | Admitting: Emergency Medicine

## 2015-06-05 DIAGNOSIS — X12XXXA Contact with other hot fluids, initial encounter: Secondary | ICD-10-CM | POA: Diagnosis not present

## 2015-06-05 DIAGNOSIS — Y9289 Other specified places as the place of occurrence of the external cause: Secondary | ICD-10-CM | POA: Diagnosis not present

## 2015-06-05 DIAGNOSIS — Y998 Other external cause status: Secondary | ICD-10-CM | POA: Diagnosis not present

## 2015-06-05 DIAGNOSIS — T300 Burn of unspecified body region, unspecified degree: Secondary | ICD-10-CM

## 2015-06-05 DIAGNOSIS — T23201A Burn of second degree of right hand, unspecified site, initial encounter: Secondary | ICD-10-CM | POA: Diagnosis not present

## 2015-06-05 DIAGNOSIS — F17210 Nicotine dependence, cigarettes, uncomplicated: Secondary | ICD-10-CM | POA: Insufficient documentation

## 2015-06-05 DIAGNOSIS — Z8719 Personal history of other diseases of the digestive system: Secondary | ICD-10-CM | POA: Insufficient documentation

## 2015-06-05 DIAGNOSIS — T23001A Burn of unspecified degree of right hand, unspecified site, initial encounter: Secondary | ICD-10-CM | POA: Diagnosis present

## 2015-06-05 DIAGNOSIS — Y9389 Activity, other specified: Secondary | ICD-10-CM | POA: Insufficient documentation

## 2015-06-05 MED ORDER — BACITRACIN ZINC 500 UNIT/GM EX OINT
TOPICAL_OINTMENT | CUTANEOUS | Status: AC
  Filled 2015-06-05: qty 0.9

## 2015-06-05 MED ORDER — BACITRACIN ZINC 500 UNIT/GM EX OINT: TOPICAL_OINTMENT | Freq: Two times a day (BID) | CUTANEOUS | Status: DC

## 2015-06-05 NOTE — ED Notes (Signed)
Pt states he was working with a vehicle and broke a tube which spilled heated antifreeze onto the posterior part of his hand.  Pt states he did have a glove on.

## 2015-06-05 NOTE — ED Provider Notes (Signed)
CSN: [id number]     Arrival date & time 06/05/15  1834 History  By signing my name below, I, Doreatha Martin, attest that this documentation has been prepared under the direction and in the presence of [employer], PA-C. Electronically Signed: Doreatha Martin, ED Scribe. 06/05/2015. 7:50 PM.    Chief Complaint  Patient presents with  . Hand Burn   The history is provided by the patient. No language interpreter was used.   HPI Comments: Richard Ramirez is a 30 y.o. male who presents to the Emergency Department complaining of moderate pain, erythema and swelling to the volar aspect of the right hand s/p burn that occurred just PTA. Per pt, he was working on his car when the hose for the anti freeze snapped and hot fluid splashed his hand. Pt states he rinsed his hand with cold water after injury. Pt denies taking OTC medications at home to improve symptoms. Denies numbness, additional injuries.   Past Medical History  Diagnosis Date  . Reflux    Past Surgical History  Procedure Laterality Date  . Wrist surgery Left 2010  . Finger surgery Right 2002    R small finger   Family History  Problem Relation Age of Onset  . Hyperlipidemia Mother    Social History  Substance Use Topics  . Smoking status: Current Some Day Smoker    Types: Cigarettes  . Smokeless tobacco: Not on file  . Alcohol Use: Yes     Comment: occasional    Review of Systems A complete 10 system review of systems was obtained and all systems are negative except as noted in the HPI and PMH.    Allergies  Review of patient's allergies indicates no known allergies.  Home Medications   Prior to Admission medications   Medication Sig Start Date End Date Taking? Authorizing Provider  cyclobenzaprine (FLEXERIL) 10 MG tablet Take 1 tablet (10 mg total) by mouth 3 (three) times daily as needed for muscle spasms. 03/09/15   Azalia Bilis, MD  ibuprofen (ADVIL,MOTRIN) 600 MG tablet Take 1 tablet (600 mg total) by mouth every 8  (eight) hours as needed. 03/09/15   Azalia Bilis, MD  predniSONE (DELTASONE) 20 MG tablet Take 3 tablets once daily for 3 days, then take 2 tablets once daily for 2 days, then take 1 tablet once daily for 2 days, then stop. 02/15/15   Osvaldo Shipper, MD   BP 123/83 mmHg  Pulse 100  Temp(Src) 98.1 F (36.7 C) (Oral)  Resp 15  Ht  (1.905 m)  Wt 124.739 kg  BMI 34.37 kg/m2  SpO2 99% Physical Exam  Constitutional: He is oriented to person, place, and time. He appears well-developed and well-nourished.  HENT:  Head: Normocephalic and atraumatic.  Eyes: Conjunctivae are normal.  Cardiovascular: Normal rate.   Pulmonary/Chest: Effort normal. No respiratory distress.  Abdominal: He exhibits no distension.  Musculoskeletal: Normal range of motion.  Neurological: He is alert and oriented to person, place, and time.  Sensation intact distally. Capillary refill less than 3 seconds. Radial pulse 2+  Skin: Skin is warm and dry.  2nd degree superficial burns the dorsal aspect of the right hand. He has a blister that is already ruptured.   Psychiatric: He has a normal mood and affect. His behavior is normal.  Nursing note and vitals reviewed.   ED Course  Procedures (including critical care time) DIAGNOSTIC STUDIES: Oxygen Saturation is 99% on RA, normal by my interpretation.    COORDINATION OF CARE:  7:45 PM Discussed treatment plan with pt at bedside which includes wound care and pt agreed to plan.   MDM   Final diagnoses:  Burn    Labs: none indicated  Imaging: none indicated  Consults: none  Therapeutics: wound care, bacitracin   Assessment: 2nd degree superficial burns the dorsal aspect of the right hand with a blister that has already ruptured  Plan: Wound care instructions provided. Advised pt to apply neosporin to the burn at home, keep the area clean and dry. Pt given strict return precautions, verbalized understanding and agreement to today's plan and had no further  questions or concerns at the time of discharge.    I personally performed the services described in this documentation, which was scribed in my presence. The recorded information has been reviewed and is accurate.   Eyvonne MechanicJeffrey Kynzlee Hucker, PA-C 06/05/15 2016  Lorre NickAnthony Allen, MD 06/09/15 [phone]

## 2015-06-05 NOTE — Discharge Instructions (Signed)
Please apply neosporin to hand twice daily. Monitor for signs of infection, please return immediately if any present.   Burn Care Your skin is a natural barrier to infection. It is the largest organ of your body. Burns damage this natural protection. To help prevent infection, it is very important to follow your caregiver's instructions in the care of your burn. Burns are classified as:  First degree. There is only redness of the skin (erythema). No scarring is expected.  Second degree. There is blistering of the skin. Scarring may occur with deeper burns.  Third degree. All layers of the skin are injured, and scarring is expected. HOME CARE INSTRUCTIONS   Wash your hands well before changing your bandage.  Change your bandage as often as directed by your caregiver.  Remove the old bandage. If the bandage sticks, you may soak it off with cool, clean water.  Cleanse the burn thoroughly but gently with mild soap and water.  Pat the area dry with a clean, dry cloth.  Apply a thin layer of antibacterial cream to the burn.  Apply a clean bandage as instructed by your caregiver.  Keep the bandage as clean and dry as possible.  Elevate the affected area for the first 24 hours, then as instructed by your caregiver.  Only take over-the-counter or prescription medicines for pain, discomfort, or fever as directed by your caregiver. SEEK IMMEDIATE MEDICAL CARE IF:   You develop excessive pain.  You develop redness, tenderness, swelling, or red streaks near the burn.  The burned area develops yellowish-white fluid (pus) or a bad smell.  You have a fever. MAKE SURE YOU:   Understand these instructions.  Will watch your condition.  Will get help right away if you are not doing well or get worse.   This information is not intended to replace advice given to you by your health care provider. Make sure you discuss any questions you have with your health care provider.   Document  Released: 02/04/2005 Document Revised: 04/29/2011 Document Reviewed: 06/27/2010 Elsevier Interactive Patient Education [employer]2016 [employer]

## 2016-03-14 ENCOUNTER — Encounter (HOSPITAL_COMMUNITY): Payer: Self-pay

## 2016-03-14 ENCOUNTER — Emergency Department (HOSPITAL_COMMUNITY): Payer: Self-pay

## 2016-03-14 ENCOUNTER — Observation Stay (HOSPITAL_COMMUNITY)
Admission: EM | Admit: 2016-03-14 | Discharge: 2016-03-15 | Disposition: A | Discharge status: Home / Self Care | Payer: BLUE CROSS/BLUE SHIELD | Attending: Family Medicine | Admitting: Family Medicine

## 2016-03-14 DIAGNOSIS — J36 Peritonsillar abscess: Secondary | ICD-10-CM

## 2016-03-14 DIAGNOSIS — Z8673 Personal history of transient ischemic attack (TIA), and cerebral infarction without residual deficits: Secondary | ICD-10-CM | POA: Insufficient documentation

## 2016-03-14 DIAGNOSIS — R590 Localized enlarged lymph nodes: Secondary | ICD-10-CM | POA: Insufficient documentation

## 2016-03-14 DIAGNOSIS — J020 Streptococcal pharyngitis: Secondary | ICD-10-CM

## 2016-03-14 DIAGNOSIS — J0300 Acute streptococcal tonsillitis, unspecified: Principal | ICD-10-CM | POA: Insufficient documentation

## 2016-03-14 DIAGNOSIS — J353 Hypertrophy of tonsils with hypertrophy of adenoids: Secondary | ICD-10-CM | POA: Insufficient documentation

## 2016-03-14 DIAGNOSIS — Z23 Encounter for immunization: Secondary | ICD-10-CM | POA: Insufficient documentation

## 2016-03-14 DIAGNOSIS — F17210 Nicotine dependence, cigarettes, uncomplicated: Secondary | ICD-10-CM | POA: Insufficient documentation

## 2016-03-14 DIAGNOSIS — G510 Bell's palsy: Secondary | ICD-10-CM | POA: Insufficient documentation

## 2016-03-14 HISTORY — DX: Bell's palsy: G51.0

## 2016-03-14 LAB — CBC WITH DIFFERENTIAL/PLATELET
Basophils Absolute: 0 10*3/uL (ref 0.0–0.1)
Basophils Relative: 0 %
EOS ABS: 0.1 10*3/uL (ref 0.0–0.7)
EOS PCT: 0 %
HCT: 47 % (ref 39.0–52.0)
HEMOGLOBIN: 16.5 g/dL (ref 13.0–17.0)
LYMPHS ABS: 1.6 10*3/uL (ref 0.7–4.0)
Lymphocytes Relative: 9 %
MCH: 30.1 pg (ref 26.0–34.0)
MCHC: 35.1 g/dL (ref 30.0–36.0)
MCV: 85.8 fL (ref 78.0–100.0)
Monocytes Absolute: 2.3 10*3/uL — ABNORMAL HIGH (ref 0.1–1.0)
Monocytes Relative: 14 %
NEUTROS PCT: 77 %
Neutro Abs: 12.7 10*3/uL — ABNORMAL HIGH (ref 1.7–7.7)
PLATELETS: 231 10*3/uL (ref 150–400)
RBC: 5.48 MIL/uL (ref 4.22–5.81)
RDW: 12.2 % (ref 11.5–15.5)
WBC: 16.5 10*3/uL — AB (ref 4.0–10.5)

## 2016-03-14 LAB — I-STAT CHEM 8, ED
BUN: 13 mg/dL (ref 6–20)
CHLORIDE: 100 mmol/L — AB (ref 101–111)
Calcium, Ion: 1.13 mmol/L — ABNORMAL LOW (ref 1.15–1.40)
Creatinine, Ser: 1.1 mg/dL (ref 0.61–1.24)
GLUCOSE: 110 mg/dL — AB (ref 65–99)
HEMATOCRIT: 49 % (ref 39.0–52.0)
HEMOGLOBIN: 16.7 g/dL (ref 13.0–17.0)
POTASSIUM: 3.8 mmol/L (ref 3.5–5.1)
Sodium: 139 mmol/L (ref 135–145)
TCO2: 26 mmol/L (ref 0–100)

## 2016-03-14 LAB — I-STAT CG4 LACTIC ACID, ED: Lactic Acid, Venous: 1.08 mmol/L (ref 0.5–1.9)

## 2016-03-14 LAB — URINALYSIS, ROUTINE W REFLEX MICROSCOPIC
Bilirubin Urine: NEGATIVE
GLUCOSE, UA: NEGATIVE mg/dL
Hgb urine dipstick: NEGATIVE
Ketones, ur: 20 mg/dL — AB
Leukocytes, UA: NEGATIVE
NITRITE: NEGATIVE
PH: 6 (ref 5.0–8.0)
PROTEIN: 100 mg/dL — AB

## 2016-03-14 LAB — LACTIC ACID, PLASMA: LACTIC ACID, VENOUS: 0.8 mmol/L (ref 0.5–1.9)

## 2016-03-14 LAB — RAPID STREP SCREEN (MED CTR MEBANE ONLY): STREPTOCOCCUS, GROUP A SCREEN (DIRECT): POSITIVE — AB

## 2016-03-14 MED ORDER — SODIUM CHLORIDE 0.9 % IV BOLUS (SEPSIS)
1000.0000 mL | Freq: Once | INTRAVENOUS | Status: AC
  Administered 2016-03-14: 1000 mL via INTRAVENOUS

## 2016-03-14 MED ORDER — CLINDAMYCIN PHOSPHATE 900 MG/50ML IV SOLN
900.0000 mg | Freq: Four times a day (QID) | INTRAVENOUS | Status: DC
  Administered 2016-03-14 – 2016-03-15 (×3): 900 mg via INTRAVENOUS
  Filled 2016-03-14 (×5): qty 50

## 2016-03-14 MED ORDER — PNEUMOCOCCAL VAC POLYVALENT 25 MCG/0.5ML IJ INJ
0.5000 mL | INJECTION | INTRAMUSCULAR | Status: AC
  Administered 2016-03-15: 0.5 mL via INTRAMUSCULAR
  Filled 2016-03-14: qty 0.5

## 2016-03-14 MED ORDER — CLINDAMYCIN PHOSPHATE 900 MG/50ML IV SOLN
900.0000 mg | Freq: Once | INTRAVENOUS | Status: AC
  Administered 2016-03-14: 900 mg via INTRAVENOUS
  Filled 2016-03-14: qty 50

## 2016-03-14 MED ORDER — MORPHINE SULFATE (PF) 4 MG/ML IV SOLN
4.0000 mg | Freq: Once | INTRAVENOUS | Status: AC
  Administered 2016-03-14: 4 mg via INTRAVENOUS
  Filled 2016-03-14: qty 1

## 2016-03-14 MED ORDER — ACETAMINOPHEN 325 MG PO TABS
650.0000 mg | ORAL_TABLET | Freq: Four times a day (QID) | ORAL | Status: DC | PRN
  Administered 2016-03-14 – 2016-03-15 (×2): 650 mg via ORAL
  Filled 2016-03-14 (×2): qty 2

## 2016-03-14 MED ORDER — IOPAMIDOL (ISOVUE-300) INJECTION 61%
INTRAVENOUS | Status: AC
Start: 2016-03-14 — End: 2016-03-14
  Administered 2016-03-14: 75 mL
  Filled 2016-03-14: qty 75

## 2016-03-14 MED ORDER — PREDNISONE 20 MG PO TABS
40.0000 mg | ORAL_TABLET | Freq: Every day | ORAL | Status: AC
  Administered 2016-03-14 – 2016-03-15 (×2): 40 mg via ORAL
  Filled 2016-03-14 (×2): qty 2

## 2016-03-14 MED ORDER — ONDANSETRON HCL 4 MG/2ML IJ SOLN
4.0000 mg | Freq: Once | INTRAMUSCULAR | Status: AC
  Administered 2016-03-14: 4 mg via INTRAVENOUS
  Filled 2016-03-14: qty 2

## 2016-03-14 MED ORDER — CLINDAMYCIN PHOSPHATE 900 MG/50ML IV SOLN: 900.0000 mg | Freq: Once | INTRAVENOUS | Status: DC

## 2016-03-14 MED ORDER — ENOXAPARIN SODIUM 40 MG/0.4ML ~~LOC~~ SOLN: 40.0000 mg | SUBCUTANEOUS | Status: DC

## 2016-03-14 MED ORDER — DEXAMETHASONE SODIUM PHOSPHATE 10 MG/ML IJ SOLN
20.0000 mg | Freq: Once | INTRAMUSCULAR | Status: AC
  Administered 2016-03-14: 20 mg via INTRAVENOUS
  Filled 2016-03-14: qty 2

## 2016-03-14 NOTE — H&P (Signed)
[facility] Admission History and Physical Service Pager: [phone]  Patient name: Richard Ramirez Medical Ramirez number: [id number] Date of birth: [birth year] Age: 31 y.o. Gender: male  Primary Care Provider: Pcp Not In System Consultants: ENT Code Status: Full  Chief Complaint: Throat Pain  Assessment and Plan: Richard Ramirez is a 30 y.o. male presenting with throat pain. PMH is significant for gastroesophageal reflux, stroke and Bell's Palsy.    Streptococcal Pharyngitis with Possible Abscess: His complaints of throat pain for 4 days that has been worsening over the past 3 days, physical exam consistent with pustular exudate in the oropharynx, muffled voice and cervical lymphadenopathy in the context of a CT consistent with tonsillitis with possible abscess is the most likely cause of his presentation. Other things that should be considered would be an epiglottitis, but this is very rare and he does not appear emergently clinically ill at this time and is maintaining his airway and CT did not show an inflamed epiglottis. In the context of his positive GAS pharyngeal culture, I think that this is not viral in etiology. Retropharyngeal abscess is also very unlikely given his lack of history of trauma to this posterior oropharynx and lack of findings on CT head. ENT is on board and has recommended admission if he did not show any signs of improvement, which he did not and does not feel comfortable going home given his inability to swallow and keep himself fed/hydrated. Labs remarkable for leukocytosis to 16.5 (although patient has been on prednisone) and normal lactic acid. Patient afebrile and maintaining appropriate O2 saturations on RA at time of admission.  - Observation in MedSurg with Dr. Lum Babe, vital signs per unit with continuous pulse ox  - ENT has been consulted and we appreciate their recs.  - S/P 1L NS bolus in the ED. - S/P decadron 20 mg IV once in the  ED.  - Monitor airway. - Continue clindamycin 900 mg IV Q6H, at discharge plan to give Clindamycin 300 mg PO QID10 days per ENT recs (1/25>>2/4). - Continue home prednisone taper (has completed 20 mg TID for 3 days, 20 mg BID for 2 days 1/25>>1/26 and 20 mg QD for 1 day 1/27). - Start tylenol 650 mg PO Q6H PRN for pain/fever.   Bell's Palsy: Chronic and stable. This occurred December 2016. He is currently finishing a steroid taper as above. - Continue steroid taper as above.   H/o CVA: Stable and likely non-contributory to his current problem. He states that he had a stroke around the same time he was diagnosed with Bell's Palsy, but is not currently on any medications for this.  - Will check lipid panel and A1c.   GERD: Stable at this time. Not on any home medicines for this.   FEN/GI: No fluids. Replete electrolytes as needed. NPO. Will get swallow eval.   Prophylaxis: Lovenox.  Disposition: Observation status; Dr. Lum Babe.   History of Present Illness:  Richard Ramirez is a 31 y.o. male presenting with throat pain and difficulty swallowing. This started 4 days ago after he spent a night of smoking cigarettes. He has never had this issue in the past, though. At the onset of his throat pain and difficulty swallowing on Monday, 03/11/16, it then worsened on 03/12/16. He presented today because his symptoms were not getting any better and he has had difficulty keeping himself hydrated and fed. He has not urinated in the past couple of days and had not moved his bowels within  the past 4 days. He has associated symptoms as below. Most of his associated symptoms are exacerbated when his throat pain is intermittently worsening. Swallowing acutely makes his throat pain worse. He has been waking up about every 2 hours to open up his mouth and rolling over to let it drain his secretions.   In the ED they confirmed his symptomology and also noted tonsillar edema and a muffled voice. He was given 1 L NS  bolus, 20 mg IV decadron and 900 mg IV clindamycin for treatment of his infection and morphine for pain control. His CBC was remarkable for a leukocytosis to 16.5 with a left shift to 12.7 and an increased glucose on BMP to 110. He has a negative lactic acidosis x2. CT of the head and neck showed some cervical lymphadenopathy, tonsillitis and possible developing left tonsillar abscess. ENT was consulted who recommended giving him another dose of clindamycin in 6 hours and then reevaluating, but the patient does not feel comfortable going home and being able to handle his secretions, so it was decided to admit the patient.   Review Of Systems: Per HPI with the following additions:   Review of Systems  Constitutional: Positive for chills. Negative for fever.  HENT: Positive for ear pain, sinus pain, sore throat and tinnitus. Negative for congestion.   Eyes: Positive for blurred vision.  Respiratory: Negative for cough and shortness of breath.   Cardiovascular: Negative for chest pain (With deep inspiration).  Gastrointestinal: Positive for abdominal pain and nausea. Negative for constipation, diarrhea and vomiting.  Neurological: Positive for dizziness, speech change and headaches. Negative for tingling and focal weakness.    Patient Active Problem List   Diagnosis Date Noted  . Weakness 02/13/2015  . 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 Reflux     Past Surgical History: Past Surgical History:  Procedure Laterality Date  . FINGER SURGERY Right 2002   R small finger  . WRIST SURGERY Left 2010    Social History: Social History  Substance Use Topics  . Smoking status: Current Some Day Smoker    Types: Cigarettes  . Smokeless tobacco: Not on file  . Alcohol use Yes     Comment: occasional   Additional social history: Smokes about 1 pack per week when he is out at a bar. No drug use. 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  Please also refer to relevant sections of EMR.  Family History: Family History  Problem Relation Age of Onset  . Hyperlipidemia Mother    Allergies and Medications: No Known Allergies No current facility-administered medications on file prior to encounter.    Current Outpatient Prescriptions on File Prior to Encounter  Medication Sig Dispense Refill  . cyclobenzaprine (FLEXERIL) 10 MG tablet Take 1 tablet (10 mg total) by mouth 3 (three) times daily as needed for muscle spasms. 12 tablet 0  . ibuprofen (ADVIL,MOTRIN) 600 MG tablet Take 1 tablet (600 mg total) by mouth every 8 (eight) hours as needed. 15 tablet 0  . predniSONE (DELTASONE) 20 MG tablet Take 3 tablets once daily for 3 days, then take 2 tablets once daily for 2 days, then take 1 tablet once daily for 2 days, then stop. 15 tablet 0    Objective: BP 132/98   Pulse 104   Temp 99.4 F (37.4 C) (Oral)   Resp 20   SpO2 99%  Exam: General: Ill-appearing, but not acutely  sick overweight male laying in bed. Eyes: Sclera are anicteric and non-injected. EOMI. PERRLA. ENTM: Ears, nose and head are normal in appearance on gross inspection. The posterior oropharynx is not able to be visualized 2/2 tonsillar edema. There is some pustular exudate noted in the oropharynx next to the uvula. Uvula is midline. Breath is very malodorous. Dentition is normal. Tympanic membranes are normal bilaterally. Trismus is present.  Neck:  There is diffuse, painful, shotty cervical lymphadenopathy present bilaterally.  Cardiovascular: He has a regular rate and rhythm without any murmurs, rubs or gallops.  Respiratory: He has a good work of breathing without any wheezes, crackles or rhonchi. Occasional upper airway transmission sounds can be heard. Gastrointestinal: Abdomen is large, but soft, non-distended and mildly tender diffusely to palpation. No rebound or guarding is present.  MSK: He is able to sit himself up in bed and move  all 4 extremities normally and equally.  Derm: No skin lesions are noted.  Neuro: CN 2-12 are intact.  Psych: Euthymic mood and congruent affect.   Labs and Imaging: CBC BMET   Recent Labs Lab 03/14/16 1245 03/14/16 1305  WBC 16.5*  --   HGB 16.5 16.7  HCT 47.0 49.0  PLT 231  --     Recent Labs Lab 03/14/16 1305  NA 139  K 3.8  CL 100*  BUN 13  CREATININE 1.10  GLUCOSE 110*     Lactic acidosis negative x2. Rapid Group A Strep screen positive.  CT Head: FINDINGS: Pharynx and larynx: Negative larynx. Retained secretions in the hypopharynx. Palatine tonsillar hypertrophy and effacement of the oropharynx in the midline (series 2, image 39). Superimposed retained secretions in the oropharynx and inferior nasopharynx. Adenoid hypertrophy with less pronounced effacement of the nasopharynx.  Along the left lateral aspect of the palatine tonsil there is a subtle area of hypodensity encompassing 13 mm (series 2, image 43 and sagittal image 61). No other suppuration identified within the tonsils.  There is inflammation however throughout the left parapharyngeal space. The retropharyngeal space is spared. The right parapharyngeal space is normal.  Salivary glands: Inflammation tracking into the left submandibular space. Negative sublingual space. The left submandibular gland appears to remain normal. The right submandibular gland and both parotid glands are within normal limits.  Thyroid: Negative.  Lymph nodes: Bulky reactive appearing bilateral level 2 lymph nodes, slightly larger on the left. These individually measure up to 18 mm short axis. No cystic or necrotic nodes. The level 1 nodes are spared. Level 3 is relatively spared.  Vascular: Major vascular structures in the neck and at the skullbase appear patent including both internal jugular veins.  Limited intracranial: Negative.  Visualized orbits: Negative.  Mastoids and visualized paranasal  sinuses: Clear.  Skeleton: No dental abnormality identified. No osseous abnormality identified.  Upper chest: Negative lung apices. No superior mediastinal lymphadenopathy.  IMPRESSION: 1. Bilateral acute tonsillitis. Suspicion of a developing 13 mm intra-tonsillar abscess on the left (series 2, image 43), but this might not yet be drainable. Tonsillar and adenoid hypertrophy results in subtotal effacement of the pharynx. 2. Associated trans-spatial (left parapharyngeal and submandibular space) inflammation but no other organized or drainable fluid collection. 3. Associated left greater than right cervical lymphadenopathy. No suppurative lymph nodes.  John Giovanni, MS4 03/14/2016, 4:13 PM [facility]PT[facility] Intern pager: [phone], text pages welcome   RESIDENT ADDENDUM  I have separately seen and examined the patient. I have discussed the findings and exam with the medical student  and agree with the above note, which I have edited appropriately. I helped develop the management plan that is described in the student's note, and I agree with the content.  Additionally I have outlined my exam and assessment/plan below:   Briefly, a 31 year old male with PMH of CVA and Bell's Palsy who presented with sore throat and difficulty swallowing. . Due to patient's concern about inability to tolerate secretions at home, admission was recommended.   PE:  General: appears uncomfortable but non-toxic, lying in bed  Eyes:  EOMI. PERRLA. ENTM: Significant bilateral tonsillar edema with pustular exudate noted. Uvula is midline. Normal dentition. MMM.  Neck:  There is diffuse, painful, shotty cervical lymphadenopathy present bilaterally.  Cardiovascular: RRR. No murmurs appreciated.  Respiratory: Normal WOB. Occasional transmitted upper airway sound otherwise clear with good air movement.  Gastrointestinal: +BS, soft, ND, mildly TTP diffusely, no rebound or guarding  MSK: Moves all  4 extremities normally and equally.  Derm: No skin lesions on exposed skin.   Neuro: CN 2-12 are intact. Alert and oriented. Speech painful.  Psych: Normal mood and affect.   A/P:  Exam remarkable for significant tonsillar edema with exudates. GAS swab positive. CT neck significant for bilateral acute tonsillitis and concern for developing left intra-tonsillar abscess. Labs remarkable for leukocytosis to 16.5 (although patient has been on steroids) and normal lactic acid. Patient afebrile with normal O2 on RA at time of admission. He is alert and able to protect his airway at present. Patient concerned that he is unable to handle his own secretions.  ENT consulted who recommended IV Clindamycin.  -admit to MedSurg, vital signs per unit with continuous pulse ox  -ENT consulted, appreciate recs  -continue IV Clindamycin with plans to transition to PO Clindamycin when tolerating PO  -NPO pending swallow screen  -s/p Decadron in ED, continue home Prednisone taper  -tylenol for pain and fevers    Marcy Sirenatherine Wallace, D.O. 03/14/2016, 8:17 PM PGY-2, [facility]

## 2016-03-14 NOTE — ED Provider Notes (Signed)
[facility]-EMERGENCY DEPT Provider Note   CSN: [id number] Arrival date & time: 03/14/16  1025  By signing my name below, I, Majel HomerPeyton Lee, attest that this documentation has been prepared under the direction and in the presence of Fayrene HelperBowie Dior Stepter, PA-C . Electronically Signed: Majel HomerPeyton Lee, Scribe. 03/14/2016. 11:58 AM.  History   Chief Complaint Chief Complaint  Patient presents with  . sore throat, ear pain, headache   The history is provided by the patient. No language interpreter was used.   HPI Comments: Richard Ramirez is a 31 y.o. male who presents to the Emergency Department complaining of gradually worsening, sore throat that began ~4 days ago. Pt reports associated congestion, chills, mild abdominal pain, generalized body aches, difficulty swallowing in which he "has to spit out his secretions," and shortness of breath that is exacerbated at night while sleeping. He states hx of strep throat once in the past. He notes he has taken over the counter medications and drank green tea with no relief. Pt denies sick contacts, fever, ear pain, rhinorrhea, cough, chest pain, and hx of DM.   Past Medical History:  Diagnosis Date  . Reflux    Patient Active Problem List   Diagnosis Date Noted  . Weakness 02/13/2015  . 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 FINGER SURGERY Right 2002   R small finger  . WRIST SURGERY Left 2010    Home Medications    Prior to Admission medications   Medication Sig Start Date End Date Taking? Authorizing Provider  cyclobenzaprine (FLEXERIL) 10 MG tablet Take 1 tablet (10 mg total) by mouth 3 (three) times daily as needed for muscle spasms. 03/09/15   Azalia BilisKevin Campos, MD  ibuprofen (ADVIL,MOTRIN) 600 MG tablet Take 1 tablet (600 mg total) by mouth every 8 (eight) hours as needed. 03/09/15   Azalia BilisKevin Campos, MD  predniSONE (DELTASONE) 20 MG tablet Take 3 tablets once daily for 3 days, then take 2 tablets once daily for 2 days, then  take 1 tablet once daily for 2 days, then stop. 02/15/15   Osvaldo ShipperGokul Krishnan, MD    Family History Family History  Problem Relation Age of Onset  . Hyperlipidemia Mother     Social History Social History  Substance Use Topics  . Smoking status: Current Some Day Smoker    Types: Cigarettes  . Smokeless tobacco: Not on file  . Alcohol use Yes     Comment: occasional     Allergies   Patient has no known allergies.  Review of Systems Review of Systems  Constitutional: Positive for chills. Negative for fever.  HENT: Positive for congestion, sore throat and trouble swallowing. Negative for ear pain and rhinorrhea.   Respiratory: Positive for shortness of breath. Negative for cough.   Cardiovascular: Negative for chest pain.  Gastrointestinal: Positive for abdominal pain.  Musculoskeletal: Positive for myalgias.   Physical Exam Updated Vital Signs BP 132/98   Pulse 104   Temp 99.4 F (37.4 C) (Oral)   Resp 20   SpO2 99%   Physical Exam  Constitutional: He is oriented to person, place, and time. He appears well-developed and well-nourished.  HENT:  Head: Normocephalic.  Right Ear: External ear normal.  Left Ear: External ear normal.  Uvula is midline. Postoropharyngeal erythema and edema. Bilateral tonsil enlargement. Positive trismus. Muffled voice and drooling.   Eyes: EOM are normal.  Neck: Normal range of motion.  Anterior cervical lymphadenopathy. Trachea is midline.   Cardiovascular: Tachycardia  present.  Exam reveals no gallop and no friction rub.   No murmur heard. Pulmonary/Chest: Effort normal and breath sounds normal.  Lungs clear to auscultation bilaterally   Abdominal: He exhibits no distension. There is tenderness.  Diffusely tender upon palpation. No hepatonephromegaly.   Musculoskeletal: Normal range of motion.  Lymphadenopathy:    He has cervical adenopathy.  Neurological: He is alert and oriented to person, place, and time.  Psychiatric: He has a  normal mood and affect.  Nursing note and vitals reviewed.  ED Treatments / Results  DIAGNOSTIC STUDIES:  Oxygen Saturation is 99% on RA, normal by my interpretation.    COORDINATION OF CARE:  11:55 AM Discussed treatment plan with pt at bedside and pt agreed to plan.  Labs (all labs ordered are listed, but only abnormal results are displayed) Labs Reviewed  RAPID STREP SCREEN (NOT AT [facility]) - Abnormal; Notable for the following:       Result Value   Streptococcus, Group A Screen (Direct) POSITIVE (*)    All other components within normal limits  CBC WITH DIFFERENTIAL/PLATELET - Abnormal; Notable for the following:    WBC 16.5 (*)    Neutro Abs 12.7 (*)    Monocytes Absolute 2.3 (*)    All other components within normal limits  I-STAT CHEM 8, ED - Abnormal; Notable for the following:    Chloride 100 (*)    Glucose, Bld 110 (*)    Calcium, Ion 1.13 (*)    All other components within normal limits  LACTIC ACID, PLASMA  I-STAT CG4 LACTIC ACID, ED    EKG  EKG Interpretation None       Radiology Ct Soft Tissue Neck W Contrast  Result Date: 03/14/2016 CLINICAL DATA:  31 year old male with sore throat, pharyngitis for 4 days. Initial encounter. EXAM: CT NECK WITH CONTRAST TECHNIQUE: Multidetector CT imaging of the neck was performed using the standard protocol following the bolus administration of intravenous contrast. CONTRAST:  75mL ISOVUE-300 IOPAMIDOL (ISOVUE-300) INJECTION 61% COMPARISON:  CT cervical spine 09/06/2006.  Brain MRI 03/09/2015. FINDINGS: Pharynx and larynx: Negative larynx. Retained secretions in the hypopharynx. Palatine tonsillar hypertrophy and effacement of the oropharynx in the midline (series 2, image 39). Superimposed retained secretions in the oropharynx and inferior nasopharynx. Adenoid hypertrophy with less pronounced effacement of the nasopharynx. Along the left lateral aspect of the palatine tonsil there is a subtle area of hypodensity encompassing 13  mm (series 2, image 43 and sagittal image 61). No other suppuration identified within the tonsils. There is inflammation however throughout the left parapharyngeal space. The retropharyngeal space is spared. The right parapharyngeal space is normal. Salivary glands: Inflammation tracking into the left submandibular space. Negative sublingual space. The left submandibular gland appears to remain normal. The right submandibular gland and both parotid glands are within normal limits. Thyroid: Negative. Lymph nodes: Bulky reactive appearing bilateral level 2 lymph nodes, slightly larger on the left. These individually measure up to 18 mm short axis. No cystic or necrotic nodes. The level 1 nodes are spared. Level 3 is relatively spared. Vascular: Major vascular structures in the neck and at the skullbase appear patent including both internal jugular veins. Limited intracranial: Negative. Visualized orbits: Negative. Mastoids and visualized paranasal sinuses: Clear. Skeleton: No dental abnormality identified. No osseous abnormality identified. Upper chest: Negative lung apices. No superior mediastinal lymphadenopathy. IMPRESSION: 1. Bilateral acute tonsillitis. Suspicion of a developing 13 mm intra-tonsillar abscess on the left (series 2, image 43), but this might not yet  be drainable. Tonsillar and adenoid hypertrophy results in subtotal effacement of the pharynx. 2. Associated trans-spatial (left parapharyngeal and submandibular space) inflammation but no other organized or drainable fluid collection. 3. Associated left greater than right cervical lymphadenopathy. No suppurative lymph nodes. Electronically Signed   By: Odessa Fleming M.D.   On: 03/14/2016 14:39   Procedures Procedures (including critical care time)  Medications Ordered in ED Medications  clindamycin (CLEOCIN) IVPB 900 mg (not administered)  sodium chloride 0.9 % bolus 1,000 mL (0 mLs Intravenous Stopped 03/14/16 1353)  morphine 4 MG/ML injection 4 mg  (4 mg Intravenous Given 03/14/16 1254)  ondansetron (ZOFRAN) injection 4 mg (4 mg Intravenous Given 03/14/16 1254)  dexamethasone (DECADRON) injection 20 mg (20 mg Intravenous Given 03/14/16 1254)  clindamycin (CLEOCIN) IVPB 900 mg (0 mg Intravenous Stopped 03/14/16 1322)  iopamidol (ISOVUE-300) 61 % injection (75 mLs  Contrast Given 03/14/16 1418)  morphine 4 MG/ML injection 4 mg (4 mg Intravenous Given 03/14/16 1510)  sodium chloride 0.9 % bolus 1,000 mL (1,000 mLs Intravenous New Bag/Given 03/14/16 1510)    Initial Impression / Assessment and Plan / ED Course  I have reviewed the triage vital signs and the nursing notes.  Pertinent labs & imaging results that were available during my care of the patient were reviewed by me and considered in my medical decision making (see chart for details).     BP 132/98   Pulse 104   Temp 99.4 F (37.4 C) (Oral)   Resp 20   SpO2 99%    I personally performed the services described in this documentation, which was scribed in my presence. The recorded information has been reviewed and is accurate.     Final Clinical Impressions(s) / ED Diagnoses   Final diagnoses:  Tonsillar abscess  Strep pharyngitis    New Prescriptions New Prescriptions   No medications on file   3:39 PM Pt presenting with sorethroat and fever.  Throat exam remarkable for significant tonsillar edema without exudates.  Pt unable to tolerates his secretion.  Labs remarkable for WBC 16.5, strep is positive, normal lactic acid.  Pt is not a diabetic.  Pt given decadron 20mg  IV as well as clindamycin 900mg  IV.  Pt was monitored for the past 5 hrs without significant improvement.  Still cannot tolerates secretion.  I have consulted ENT specialist, Dr. Suszanne Conners who recommend repeating clinda 900mg  at the 6 hrs mark.  If pt still not show any improvement, then consult medicine for admission for obs.  Pt does not feel comfortable going home at this time. Will consult for admission.     4:19 PM Another dose of clinda 900mg  will be given.  Appreciate consultation from [facility] resident who will see pt in the ER and will admit for obs.  Once discharge, pt can be prescribe clindamycin 300mg  PO QID x 10 days.  Pt can f/u with Dr. Suszanne Conners outpt as needed.  Pt is aware of plan.     Fayrene Helper, PA-C 03/14/16 1621    Geoffery Lyons, MD 03/14/16 [id number]

## 2016-03-14 NOTE — ED Notes (Signed)
Pt was moved from the chair and placed in a stretcher. Pt given warm blankets to use as a pillow and is pleased to be in a bed.

## 2016-03-14 NOTE — ED Triage Notes (Signed)
Patient here with 4 days of sore throat, ear pain, congestion and headache x 4 days. Unable to visualize back of throat at triage, NAD

## 2016-03-15 LAB — BASIC METABOLIC PANEL
Anion gap: 8 (ref 5–15)
BUN: 15 mg/dL (ref 6–20)
CALCIUM: 9.4 mg/dL (ref 8.9–10.3)
CHLORIDE: 108 mmol/L (ref 101–111)
CO2: 23 mmol/L (ref 22–32)
CREATININE: 0.89 mg/dL (ref 0.61–1.24)
GFR calc non Af Amer: >60 mL/min (ref >60)
Glucose, Bld: 133 mg/dL — ABNORMAL HIGH (ref 65–99)
Potassium: 4 mmol/L (ref 3.5–5.1)
SODIUM: 139 mmol/L (ref 135–145)

## 2016-03-15 LAB — CBC
HCT: 45.8 % (ref 39.0–52.0)
Hemoglobin: 15.7 g/dL (ref 13.0–17.0)
MCH: 29.6 pg (ref 26.0–34.0)
MCHC: 34.3 g/dL (ref 30.0–36.0)
MCV: 86.3 fL (ref 78.0–100.0)
PLATELETS: 242 10*3/uL (ref 150–400)
RBC: 5.31 MIL/uL (ref 4.22–5.81)
RDW: 12.5 % (ref 11.5–15.5)
WBC: 18.3 10*3/uL — AB (ref 4.0–10.5)

## 2016-03-15 MED ORDER — CHLORHEXIDINE GLUCONATE 0.12 % MT SOLN: 15.0000 mL | Freq: Two times a day (BID) | OROMUCOSAL | 0 refills | Status: DC

## 2016-03-15 MED ORDER — CLINDAMYCIN HCL 300 MG PO CAPS: 300.0000 mg | ORAL_CAPSULE | Freq: Four times a day (QID) | ORAL | 0 refills | Status: DC

## 2016-03-15 MED ORDER — ORAL CARE MOUTH RINSE
15.0000 mL | Freq: Two times a day (BID) | OROMUCOSAL | Status: DC
  Administered 2016-03-15: 15 mL via OROMUCOSAL

## 2016-03-15 MED ORDER — ACETAMINOPHEN 325 MG PO TABS: 650.0000 mg | ORAL_TABLET | Freq: Four times a day (QID) | ORAL | 0 refills | Status: DC | PRN

## 2016-03-15 MED ORDER — CLINDAMYCIN HCL 300 MG PO CAPS
300.0000 mg | ORAL_CAPSULE | Freq: Four times a day (QID) | ORAL | Status: DC
  Administered 2016-03-15: 300 mg via ORAL
  Filled 2016-03-15: qty 1

## 2016-03-15 MED ORDER — ENOXAPARIN SODIUM 80 MG/0.8ML ~~LOC~~ SOLN
0.5000 mg/kg | SUBCUTANEOUS | Status: DC
  Administered 2016-03-15: 15:00:00 70 mg via SUBCUTANEOUS
  Filled 2016-03-15: qty 0.8

## 2016-03-15 MED ORDER — CLINDAMYCIN HCL 300 MG PO CAPS: 900.0000 mg | ORAL_CAPSULE | Freq: Four times a day (QID) | ORAL | Status: DC

## 2016-03-15 MED ORDER — IBUPROFEN 100 MG PO CHEW
100.0000 mg | CHEWABLE_TABLET | Freq: Three times a day (TID) | ORAL | Status: DC | PRN
  Filled 2016-03-15 (×2): qty 1

## 2016-03-15 MED ORDER — CHLORHEXIDINE GLUCONATE 0.12 % MT SOLN
15.0000 mL | Freq: Two times a day (BID) | OROMUCOSAL | Status: DC
  Administered 2016-03-15: 15 mL via OROMUCOSAL
  Filled 2016-03-15: qty 15

## 2016-03-15 NOTE — Progress Notes (Signed)
Pt admitted to 5w23 from ED. Pt lives at home with parents. Pt is A&OX4. Pt is independent with his needs. Pt oriented to room. Pt's friend at bedside. Pt's skin is warm, dry and intact. Will continue to monitor pt. Nelda MarseilleJenny Thacker, RN

## 2016-03-15 NOTE — Discharge Summary (Signed)
Family Medicine Teaching [facility] Discharge Summary  Patient name: Richard PrimroseManuel Ramirez Medical record number: [id number] Date of birth: 1986/01/21 Age: 31 y.o. Gender: male Date of Admission: 03/14/2016  Date of Discharge: 03/15/2016 Admitting Physician: Doreene ElandKehinde T Eniola, MD  Primary Care Provider: Pcp Not In System Consultants: ENT  Indication for Hospitalization: Peritonsillar abscess  Discharge Diagnoses/Problem List:  Streptococcal pharyngitis Peritonsillar abscess GERD Bells palsy  Disposition: Home  Discharge Condition: Improved, stable  Discharge Exam:  General: well nourished, well developed, minimal acute distress due to left tonsillar pain with non-toxic appearance HEENT: normocephalic, atraumatic, moist mucous membranes Neck: supple, moderate left-sided tenderness to palpation with moderate diffuse swelling, unable to visualize posterior oropharynx due to tonsillar edema, pustular exudate noted on oropharynx adjacent to the uvula, uvula midline, halitosis with normal dentition present, trismus present, shotty cervical lymphadenopathy present bilaterally CV: regular rate and rhythm without murmurs, rubs, or gallops Lungs: clear to auscultation bilaterally with normal work of breathing Skin: warm, dry, no rashes or lesions, cap refill < 2 seconds Extremities: warm and well perfused, normal tone  Brief Hospital Course:  Richard FamManuel Martinezis a 30 y.o.malepresenting with throat pain. PMH is significant for gastroesophageal reflux, Bell's Palsy.  Patient presented with sore throat, excessive sputum production, and dysphagia for 4 days duration. Patient denied chocking, fevers, dyspnea, or sick contacts. He did endorse inability to eat solid foods during this time secondary to pain.  [facility], patient noted to have muffled voice, pustular tonsillar exudate, cervical lymphadenopathy without uvula deviation. Vitals remained stable without fever. Labs significant for  leukocytosis and positive GAS pharvngeal culture. CT head and neck consistent with tonsillitis with possible peritonsillar abscess. There was no retropharyngeal abscess noted and epiglottitis was unlikely given ability to hold down secretions, absence of tripod position, and no signs of dyspnea. ENT was consulted and reviewed CT noting patient did not require surgical drainage but could be managed medically. Patient able to pass swallow examination and advanced diet as tolerated with ability to take PO medications. Patient started on IV clindamycin and transition to PO for a completion of 10 days abx. Patient received IV decadron in ED once and given prednisone taper.  Issues for Follow Up:  1. Patient given Clindamycin 300 mg PO QID on d/c to complete for 10 days duration (1/25>2/4). 2. Tylenol and ibuprofen PRN for pain. 3. Clear diet and advanced as tolerated. 4. Follow up with ENT in 1 week if symptoms did not improved. Contact information given.  Significant Procedures: None  Significant Labs and Imaging:   Recent Labs Lab 03/14/16 1245 03/14/16 1305 03/15/16 0606  WBC 16.5*  --  18.3*  HGB 16.5 16.7 15.7  HCT 47.0 49.0 45.8  PLT 231  --  242    Recent Labs Lab 03/14/16 1305 03/15/16 0606  NA 139 139  K 3.8 4.0  CL 100* 108  CO2  --  23  GLUCOSE 110* 133*  BUN 13 15  CREATININE 1.10 0.89  CALCIUM  --  9.4   Rapid strep: Positive Lactic acid: 0.8  CT Soft Tissue Neck W Contrast (03/14/2016) FINDINGS: Pharynx and larynx: Negative larynx. Retained secretions in the hypopharynx. Palatine tonsillar hypertrophy and effacement of the oropharynx in the midline (series 2, image 39). Superimposed retained secretions in the oropharynx and inferior nasopharynx. Adenoid hypertrophy with less pronounced effacement of the nasopharynx.  Along the left lateral aspect of the palatine tonsil there is a subtle area of hypodensity encompassing 13 mm (series 2, image 43  and sagittal image  61). No other suppuration identified within the tonsils.  There is inflammation however throughout the left parapharyngeal space. The retropharyngeal space is spared. The right parapharyngeal space is normal.  Salivary glands: Inflammation tracking into the left submandibular space. Negative sublingual space. The left submandibular gland appears to remain normal. The right submandibular gland and both parotid glands are within normal limits.  Thyroid: Negative.  Lymph nodes: Bulky reactive appearing bilateral level 2 lymph nodes, slightly larger on the left. These individually measure up to 18 mm short axis. No cystic or necrotic nodes. The level 1 nodes are spared. Level 3 is relatively spared.  Vascular: Major vascular structures in the neck and at the skullbase appear patent including both internal jugular veins.  Limited intracranial: Negative.  Visualized orbits: Negative.  Mastoids and visualized paranasal sinuses: Clear.  Skeleton: No dental abnormality identified. No osseous abnormality identified.  Upper chest: Negative lung apices. No superior mediastinal lymphadenopathy.  IMPRESSION: 1. Bilateral acute tonsillitis. Suspicion of a developing 13 mm intra-tonsillar abscess on the left (series 2, image 43), but this might not yet be drainable. Tonsillar and adenoid hypertrophy results in subtotal effacement of the pharynx. 2. Associated trans-spatial (left parapharyngeal and submandibular space) inflammation but no other organized or drainable fluid collection. 3. Associated left greater than right cervical lymphadenopathy. No suppurative lymph nodes.  Results/Tests Pending at Time of Discharge: None  Discharge Medications:  Allergies as of 03/15/2016   No Known Allergies     Medication List    STOP taking these medications   cyclobenzaprine 10 MG tablet Commonly known as:  FLEXERIL   predniSONE 20 MG tablet Commonly known as:  DELTASONE     TAKE these  medications   acetaminophen 325 MG tablet Commonly known as:  TYLENOL Take 2 tablets (650 mg total) by mouth every 6 (six) hours as needed for mild pain, moderate pain, fever or headache.   chlorhexidine 0.12 % solution Commonly known as:  PERIDEX 15 mLs by Mouth Rinse route 2 (two) times daily.   clindamycin 300 MG capsule Commonly known as:  CLEOCIN Take 1 capsule (300 mg total) by mouth every 6 (six) hours.   ibuprofen 600 MG tablet Commonly known as:  ADVIL,MOTRIN Take 1 tablet (600 mg total) by mouth every 8 (eight) hours as needed.       Discharge Instructions: Please refer to Patient Instructions section of EMR for full details.  Patient was counseled important signs and symptoms that should prompt return to medical care, changes in medications, dietary instructions, activity restrictions, and follow up appointments.   Follow-Up Appointments: Follow-up Information    Darletta Moll, MD. Schedule an appointment as soon as possible for a visit.   Specialty:  Otolaryngology Why:  if no improvement Contact information: [address] [place] [address] [phone]           Wendee Beavers, DO 03/17/2016, 8:41 PM PGY-1, [facility]

## 2016-03-15 NOTE — Consult Note (Signed)
Reason for Consult: Tonsillitis, peritonsillar abscess Referring Physician: Veryl Speak, MD  HPI:  Richard Ramirez is an 31 y.o. male who presented to the ED yesterday complaining of severe sore throat for 4 days. Pt reports associated congestion, chills, abdominal pain, generalized body aches, difficulty swallowing, and shortness of breath that is exacerbated at night while sleeping. He had strep throat once in the past. He has taken over the counter medications and drank green tea with no relief. Pt denies sick contacts, fever, ear pain, rhinorrhea, cough, chest pain, and hx of DM.  His neck CT showed bilateral acute tonsillitis, with a developing 13 mm intra-tonsillar abscess on the left.  Past Medical History:  Diagnosis Date  . Bell's palsy 01/2015  . Reflux   . Stroke [facility]) 01/2015   Per his report.     Past Surgical History:  Procedure Laterality Date  . FINGER SURGERY Right 2002   R small finger  . WRIST SURGERY Left 2010    Family History  Problem Relation Age of Onset  . Hyperlipidemia Mother     Social History:  reports that he has been smoking Cigarettes.  He has never used smokeless tobacco. He reports that he drinks alcohol. He reports that he does not use drugs.  Allergies: No Known Allergies  Prior to Admission medications   Medication Sig Start Date End Date Taking? Authorizing Provider  cyclobenzaprine (FLEXERIL) 10 MG tablet Take 1 tablet (10 mg total) by mouth 3 (three) times daily as needed for muscle spasms. 03/09/15  Yes Jola Schmidt, MD  predniSONE (DELTASONE) 20 MG tablet Take 3 tablets once daily for 3 days, then take 2 tablets once daily for 2 days, then take 1 tablet once daily for 2 days, then stop. 02/15/15  Yes Bonnielee Haff, MD  ibuprofen (ADVIL,MOTRIN) 600 MG tablet Take 1 tablet (600 mg total) by mouth every 8 (eight) hours as needed. Patient not taking: Reported on 03/14/2016 03/09/15   Jola Schmidt, MD    Medications:  I have reviewed the  patient's current medications. Scheduled: . chlorhexidine  15 mL Mouth Rinse BID  . clindamycin  300 mg Oral Q6H  . enoxaparin (LOVENOX) injection  0.5 mg/kg Subcutaneous Q24H  . mouth rinse  15 mL Mouth Rinse q12n4p   OAC:[id number], ibuprofen  Results for orders placed or performed during the hospital encounter of 03/14/16 (from the past 48 hour(s))  Rapid strep screen     Status: Abnormal   Collection Time: 03/14/16 12:35 PM  Result Value Ref Range   Streptococcus, Group A Screen (Direct) POSITIVE (A) NEGATIVE  CBC with Differential     Status: Abnormal   Collection Time: 03/14/16 12:45 PM  Result Value Ref Range   WBC 16.5 (H) 4.0 - 10.5 K/uL   RBC 5.48 4.22 - 5.81 MIL/uL   Hemoglobin 16.5 13.0 - 17.0 g/dL   HCT 47.0 39.0 - 52.0 %   MCV 85.8 78.0 - 100.0 fL   MCH 30.1 26.0 - 34.0 pg   MCHC 35.1 30.0 - 36.0 g/dL   RDW 12.2 11.5 - 15.5 %   Platelets 231 150 - 400 K/uL   Neutrophils Relative % 77 %   Neutro Abs 12.7 (H) 1.7 - 7.7 K/uL   Lymphocytes Relative 9 %   Lymphs Abs 1.6 0.7 - 4.0 K/uL   Monocytes Relative 14 %   Monocytes Absolute 2.3 (H) 0.1 - 1.0 K/uL   Eosinophils Relative 0 %   Eosinophils Absolute 0.1 0.0 - 0.7  K/uL   Basophils Relative 0 %   Basophils Absolute 0.0 0.0 - 0.1 K/uL  I-stat Chem 8, ED     Status: Abnormal   Collection Time: 03/14/16  1:05 PM  Result Value Ref Range   Sodium 139 135 - 145 mmol/L   Potassium 3.8 3.5 - 5.1 mmol/L   Chloride 100 (L) 101 - 111 mmol/L   BUN 13 6 - 20 mg/dL   Creatinine, Ser 1.10 0.61 - 1.24 mg/dL   Glucose, Bld 110 (H) 65 - 99 mg/dL   Calcium, Ion 1.13 (L) 1.15 - 1.40 mmol/L   TCO2 26 0 - 100 mmol/L   Hemoglobin 16.7 13.0 - 17.0 g/dL   HCT 49.0 39.0 - 52.0 %  I-Stat CG4 Lactic Acid, ED     Status: None   Collection Time: 03/14/16  1:06 PM  Result Value Ref Range   Lactic Acid, Venous 1.08 0.5 - 1.9 mmol/L  Lactic acid, plasma     Status: None   Collection Time: 03/14/16  2:07 PM  Result Value Ref Range    Lactic Acid, Venous 0.8 0.5 - 1.9 mmol/L  Urinalysis, Routine w reflex microscopic     Status: Abnormal   Collection Time: 03/14/16  7:45 PM  Result Value Ref Range   Color, Urine YELLOW YELLOW   APPearance CLEAR CLEAR   Specific Gravity, Urine >1.046 (H) 1.005 - 1.030   pH 6.0 5.0 - 8.0   Glucose, UA NEGATIVE NEGATIVE mg/dL   Hgb urine dipstick NEGATIVE NEGATIVE   Bilirubin Urine NEGATIVE NEGATIVE   Ketones, ur 20 (A) NEGATIVE mg/dL   Protein, ur 100 (A) NEGATIVE mg/dL   Nitrite NEGATIVE NEGATIVE   Leukocytes, UA NEGATIVE NEGATIVE   RBC / HPF 0-5 0 - 5 RBC/hpf   WBC, UA 0-5 0 - 5 WBC/hpf   Bacteria, UA RARE (A) NONE SEEN   Squamous Epithelial / LPF 0-5 (A) NONE SEEN   Mucous PRESENT   Basic metabolic panel Once     Status: Abnormal   Collection Time: 03/15/16  6:06 AM  Result Value Ref Range   Sodium 139 135 - 145 mmol/L   Potassium 4.0 3.5 - 5.1 mmol/L   Chloride 108 101 - 111 mmol/L   CO2 23 22 - 32 mmol/L   Glucose, Bld 133 (H) 65 - 99 mg/dL   BUN 15 6 - 20 mg/dL   Creatinine, Ser 0.89 0.61 - 1.24 mg/dL   Calcium 9.4 8.9 - 10.3 mg/dL   GFR calc non Af Amer >60 >60 mL/min   GFR calc Af Amer >60 >60 mL/min    Comment: (NOTE) The eGFR has been calculated using the CKD EPI equation. This calculation has not been validated in all clinical situations. eGFR's persistently <60 mL/min signify possible Chronic Kidney Disease.    Anion gap 8 5 - 15  CBC Once     Status: Abnormal   Collection Time: 03/15/16  6:06 AM  Result Value Ref Range   WBC 18.3 (H) 4.0 - 10.5 K/uL   RBC 5.31 4.22 - 5.81 MIL/uL   Hemoglobin 15.7 13.0 - 17.0 g/dL   HCT 45.8 39.0 - 52.0 %   MCV 86.3 78.0 - 100.0 fL   MCH 29.6 26.0 - 34.0 pg   MCHC 34.3 30.0 - 36.0 g/dL   RDW 12.5 11.5 - 15.5 %   Platelets 242 150 - 400 K/uL    Ct Soft Tissue Neck W Contrast  Result Date: 03/14/2016 CLINICAL DATA:  31 year old male with sore throat, pharyngitis for 4 days. Initial encounter. EXAM: CT NECK WITH  CONTRAST TECHNIQUE: Multidetector CT imaging of the neck was performed using the standard protocol following the bolus administration of intravenous contrast. CONTRAST:  51m ISOVUE-300 IOPAMIDOL (ISOVUE-300) INJECTION 61% COMPARISON:  CT cervical spine 09/06/2006.  Brain MRI 03/09/2015. FINDINGS: Pharynx and larynx: Negative larynx. Retained secretions in the hypopharynx. Palatine tonsillar hypertrophy and effacement of the oropharynx in the midline (series 2, image 39). Superimposed retained secretions in the oropharynx and inferior nasopharynx. Adenoid hypertrophy with less pronounced effacement of the nasopharynx. Along the left lateral aspect of the palatine tonsil there is a subtle area of hypodensity encompassing 13 mm (series 2, image 43 and sagittal image 61). No other suppuration identified within the tonsils. There is inflammation however throughout the left parapharyngeal space. The retropharyngeal space is spared. The right parapharyngeal space is normal. Salivary glands: Inflammation tracking into the left submandibular space. Negative sublingual space. The left submandibular gland appears to remain normal. The right submandibular gland and both parotid glands are within normal limits. Thyroid: Negative. Lymph nodes: Bulky reactive appearing bilateral level 2 lymph nodes, slightly larger on the left. These individually measure up to 18 mm short axis. No cystic or necrotic nodes. The level 1 nodes are spared. Level 3 is relatively spared. Vascular: Major vascular structures in the neck and at the skullbase appear patent including both internal jugular veins. Limited intracranial: Negative. Visualized orbits: Negative. Mastoids and visualized paranasal sinuses: Clear. Skeleton: No dental abnormality identified. No osseous abnormality identified. Upper chest: Negative lung apices. No superior mediastinal lymphadenopathy. IMPRESSION: 1. Bilateral acute tonsillitis. Suspicion of a developing 13 mm  intra-tonsillar abscess on the left (series 2, image 43), but this might not yet be drainable. Tonsillar and adenoid hypertrophy results in subtotal effacement of the pharynx. 2. Associated trans-spatial (left parapharyngeal and submandibular space) inflammation but no other organized or drainable fluid collection. 3. Associated left greater than right cervical lymphadenopathy. No suppurative lymph nodes. Electronically Signed   By: HGenevie AnnM.D.   On: 03/14/2016 14:39   Review of Systems  Constitutional: Positive for chills. Negative for fever.  HENT: Positive for congestion, sore throat and trouble swallowing. Negative for ear pain and rhinorrhea.   Respiratory: Positive for shortness of breath. Negative for cough.   Cardiovascular: Negative for chest pain.  Gastrointestinal: Positive for abdominal pain.  Musculoskeletal: Positive for myalgias.   Blood pressure (!) 143/66, pulse 65, temperature 97.7 F (36.5 C), temperature source Oral, resp. rate 16, height 6' 3"  (1.905 m), weight 299 lb 8 oz (135.9 kg), SpO2 95 %. Physical Exam  Constitutional: He is oriented to person, place, and time. He appears well-developed and well-nourished.  Head: Normocephalic.  Ears: EACs and TMs are nirmal. Mouth: Uvula is midline. Bilateral tonsil enlargement, with erythema. Mild trismus. Muffled voice. Eyes: PERRL, EOM are normal.  Neck: Normal range of motion.  Anterior cervical lymphadenopathy. Trachea is midline.   Pulmonary/Chest: Effort normal and breath sounds normal.  Lungs clear to auscultation bilaterally   Abdominal: He exhibits no distension. There is tenderness.  Musculoskeletal: Normal range of motion.  Neurological: He is alert and oriented to person, place, and time.  Psychiatric: He has a normal mood and affect.   Assessment/Plan: Bilateral acute tonsillitis, with possible early left PTA. Improved with abx treatment. May d/c home with oral clindamycin 3027mpo QID for 10 days. He may follow  up with me as an outpatient if his symptoms persist.  Johnanna Bakke,SUI W 03/15/2016, 12:11 PM

## 2016-03-15 NOTE — Progress Notes (Signed)
Family Medicine Teaching Service Daily Progress Note Intern Pager: [phone]  Patient name: Jaloni Davoli Medical record number: [id number] Date of birth: November 24, 1985 Age: 31 y.o. Gender: male  Primary Care Provider: Pcp Not In System Consultants: ENT Code Status: Full  Pt Overview and Major Events to Date:  01/25: Admit to observation for peritonsillar abscess with positive strep, no surgical intervention required per ENT 01/26: Cleared by speech for clear liquid diet with crushed medications  Assessment and Plan: Richard Ramirez is a 31 y.o. male presenting with throat pain. PMH is significant for gastroesophageal reflux, Bell's Palsy.    #Streptococcal Pharyngitis with Possible Abscess: Acute, stable. Presented with 4 days of worsening pustular exudate in the oropharynx with muffled voice and cervical lymphadenopathy concerning for peritonsillar abscess confirmed on CT. Positive for GAS. ENT consulted initially in ED who reviewed images and recommended no surgical intervention at this time. Recommendations were to initiate clindamycin IV and obtain swallow study. Patient able to maintain airway and take oral medications. Clinical picture consistent with peritonsillar abscess secondary to strep pharyngitis. Differential includes epiglottitis however patient able to maintain airway without drooling or posturing. Retropharyngeal abscess also unlikely given lack of trauma and no CT findings.  --ENT consult, appreciate recommendations  --S/P decadron 20 mg IV once in the ED  --Monitor airway --Continue clindamycin 900 mg IV Q6H, at discharge plan to give Clindamycin 300 mg PO QID10 days per ENT recs (1/25>>2/4) --Continue home prednisone taper (has completed 20 mg TID for 3 days, 20 mg BID for 2 days 1/25>>1/26 and 20 mg QD for 1 day 1/27) --Start tylenol 650 mg PO Q6H PRN for pain/fever --SLP recommends clear liquid diet with crushed medications  #Bell's Palsy: Chronic and stable. This  occurred December 2016. He is currently finishing a steroid taper as above. --Continue steroid taper as above  #GERD: Stable at this time. Not on any home medicines for this.   FEN/GI: No fluids. Replete electrolytes as needed. NPO. Will get swallow eval.  PPx: Lovenox SQ.  Disposition: Pending transition to PO antibiotic.  Subjective:  Patient says he continues to have left-sided tonsillar pain. Denies symptoms of fevers, chills, dyspnea, dysphasia. Says he has been able to tolerate taking meds by mouth yesterday.  Objective: Temp:  [97.6 F (36.4 C)-99.4 F (37.4 C)] 97.7 F (36.5 C) (01/26 0518) Pulse Rate:  [65-104] 65 (01/26 0518) Resp:  [16-20] 16 (01/26 0518) BP: (110-145)/(59-98) 143/66 (01/26 0518) SpO2:  [94 %-99 %] 95 % (01/26 0518) Weight:  [299 lb 8 oz (135.9 kg)] 299 lb 8 oz (135.9 kg) (01/25 2005) Physical Exam: General: well nourished, well developed, minimal acute distress due to left tonsillar pain with non-toxic appearance HEENT: normocephalic, atraumatic, moist mucous membranes Neck: supple, moderate left-sided tenderness to palpation with moderate diffuse swelling, unable to visualize posterior oropharynx due to tonsillar edema, pustular exudate noted on oropharynx adjacent to the uvula, uvula midline, halitosis with normal dentition present, trismus present, shotty cervical lymphadenopathy present bilaterally CV: regular rate and rhythm without murmurs, rubs, or gallops Lungs: clear to auscultation bilaterally with normal work of breathing Skin: warm, dry, no rashes or lesions, cap refill < 2 seconds Extremities: warm and well perfused, normal tone  Laboratory:  Recent Labs Lab 03/14/16 1245 03/14/16 1305  WBC 16.5*  --   HGB 16.5 16.7  HCT 47.0 49.0  PLT 231  --     Recent Labs Lab 03/14/16 1305  NA 139  K 3.8  CL 100*  BUN 13  CREATININE 1.10  GLUCOSE 110*   Urinalysis    Component Value Date/Time   COLORURINE YELLOW 03/14/2016 1945    APPEARANCEUR CLEAR 03/14/2016 1945   LABSPEC >1.046 (H) 03/14/2016 1945   PHURINE 6.0 03/14/2016 1945   GLUCOSEU NEGATIVE 03/14/2016 1945   HGBUR NEGATIVE 03/14/2016 1945   BILIRUBINUR NEGATIVE 03/14/2016 1945   KETONESUR 20 (A) 03/14/2016 1945   PROTEINUR 100 (A) 03/14/2016 1945   UROBILINOGEN 1.0 08/19/2014 2358   NITRITE NEGATIVE 03/14/2016 1945   LEUKOCYTESUR NEGATIVE 03/14/2016 1945   Rapid strep: Positive Lactic acid: 0.8  Imaging/Diagnostic Tests: CT Soft Tissue Neck W Contrast (03/14/2016) FINDINGS: Pharynx and larynx: Negative larynx. Retained secretions in the hypopharynx. Palatine tonsillar hypertrophy and effacement of the oropharynx in the midline (series 2, image 39). Superimposed retained secretions in the oropharynx and inferior nasopharynx. Adenoid hypertrophy with less pronounced effacement of the nasopharynx.  Along the left lateral aspect of the palatine tonsil there is a subtle area of hypodensity encompassing 13 mm (series 2, image 43 and sagittal image 61). No other suppuration identified within the tonsils.  There is inflammation however throughout the left parapharyngeal space. The retropharyngeal space is spared. The right parapharyngeal space is normal.  Salivary glands: Inflammation tracking into the left submandibular space. Negative sublingual space. The left submandibular gland appears to remain normal. The right submandibular gland and both parotid glands are within normal limits.  Thyroid: Negative.  Lymph nodes: Bulky reactive appearing bilateral level 2 lymph nodes, slightly larger on the left. These individually measure up to 18 mm short axis. No cystic or necrotic nodes. The level 1 nodes are spared. Level 3 is relatively spared.  Vascular: Major vascular structures in the neck and at the skullbase appear patent including both internal jugular veins.  Limited intracranial: Negative.  Visualized orbits: Negative.  Mastoids and  visualized paranasal sinuses: Clear.  Skeleton: No dental abnormality identified. No osseous abnormality identified.  Upper chest: Negative lung apices. No superior mediastinal lymphadenopathy.  IMPRESSION: 1. Bilateral acute tonsillitis. Suspicion of a developing 13 mm intra-tonsillar abscess on the left (series 2, image 43), but this might not yet be drainable. Tonsillar and adenoid hypertrophy results in subtotal effacement of the pharynx. 2. Associated trans-spatial (left parapharyngeal and submandibular space) inflammation but no other organized or drainable fluid collection. 3. Associated left greater than right cervical lymphadenopathy. No suppurative lymph nodes.    Wendee Beaversavid J McMullen, DO 03/15/2016, 6:56 AM PGY-1, [facility] FPTS Intern pager: [phone], text pages welcome

## 2016-03-15 NOTE — Evaluation (Signed)
Clinical/Bedside Swallow Evaluation Patient Details  Name: Richard PrimroseManuel Lumley MRN: [id number] Date of Birth: April 26, 1985  Today's Date: 03/15/2016 Time: SLP Start Time (ACUTE ONLY): 0836 SLP Stop Time (ACUTE ONLY): 0848 SLP Time Calculation (min) (ACUTE ONLY): 12 min  Past Medical History:  Past Medical History:  Diagnosis Date  . Bell's palsy 01/2015  . Reflux   . Stroke [facility]) 01/2015   Per his report.    Past Surgical History:  Past Surgical History:  Procedure Laterality Date  . FINGER SURGERY Right 2002   R small finger  . WRIST SURGERY Left 2010   HPI:  Pt is a 31 y.o male with PMH of gastroesophageal reflux, stroke and Bell's Palsy. Has peritonsillar abscess, positive for strep, no surgical intervention required. Pustular exudate in oropharynx, muffled voice and cervical lymphadenopathy. CT of neck showed bilateral acute tonsillitis. Suspicion of a developing 13 mm intra-tonsillar abscess on left but might not yet be drainable.Tonsillar and adenoid hypertrophy results in subtotal effacement of the pharynx, associated trans-spatial (left parapharyngeal and submandibular space) inflammation but no other organized or drainable fluid collection.   Assessment / Plan / Recommendation Clinical Impression  Mr. Richard BienenstockMartinez is a very pleasant and alert gentleman who complains of throat pain and difficulty swallowing. During the oral motor assessment, SLP noted edema around tonsils, [facility] Pt was given thin liquids via cup/straw and puree and did not exhibit any s/s of aspiration, however pt grimaced and reports pain while swallowing, especially with puree texture due to increased viscosity. Due to significant amount of edema, SLP recommends full liquid diet, meds crushed in applesauce until pain improves. Educated pt re: diet and informed pt that he can discuss upgrading diet with RN as pain subsides. ST treatment not needed.    Aspiration Risk  No limitations    Diet  Recommendation Other (Comment) (full liquids)   Liquid Administration via: Cup;Straw Medication Administration: Crushed with puree Supervision: Patient able to self feed Compensations: Slow rate;Small sips/bites Postural Changes: Seated upright at 90 degrees    Other  Recommendations Oral Care Recommendations: Oral care QID   Follow up Recommendations None      Frequency and Duration            Prognosis Prognosis for Safe Diet Advancement: Good      Swallow Study   General HPI: Pt is a 31 y.o male with PMH of gastroesophageal reflux, stroke and Bell's Palsy. Has peritonsillar abscess, positive for strep, no surgical intervention required. Pustular exudate in oropharynx, muffled voice and cervical lymphadenopathy. CT of neck showed bilateral acute tonsillitis. Suspicion of a developing 13 mm intra-tonsillar abscess on left but might not yet be drainable.Tonsillar and adenoid hypertrophy results in subtotal effacement of the pharynx, associated trans-spatial (left parapharyngeal and submandibular space) inflammation but no other organized or drainable fluid collection. Type of Study: Bedside Swallow Evaluation Previous Swallow Assessment: none Diet Prior to this Study: NPO Temperature Spikes Noted: No Respiratory Status: Room air History of Recent Intubation: No Behavior/Cognition: Alert;Pleasant mood;Cooperative Oral Cavity Assessment: Edema (throat) Oral Care Completed by SLP: No Oral Cavity - Dentition: Adequate natural dentition Vision: Functional for self-feeding Self-Feeding Abilities: Able to feed self Patient Positioning: Upright in bed Baseline Vocal Quality: Normal;Other (comment) (reports "voice just came back last night") Volitional Cough: Strong Volitional Swallow: Able to elicit    Oral/Motor/Sensory Function Overall Oral Motor/Sensory Function: Within functional limits   Ice Chips Ice chips: Not tested   Thin Liquid Thin Liquid: Within functional  limits Presentation: Cup;Self Fed;Straw Other Comments: pain    Nectar Thick Nectar Thick Liquid: Not tested   Honey Thick Honey Thick Liquid: Not tested   Puree Puree: Within functional limits Presentation: Spoon;Self Fed Other Comments: pain   Solid   GO   Solid: Not tested        Macarthur Critchley , Student-SLP 03/15/2016,9:34 AM

## 2016-03-15 NOTE — Discharge Instructions (Addendum)
Tonsillitis Tonsillitis is an infection of the throat. This infection causes the tonsils to become red, tender, and puffy (swollen). Tonsils are groups of tissue at the back of your throat. If bacteria caused your infection, antibiotic medicine will be given to you. Sometimes symptoms of tonsillitis can be relieved with the use of steroid medicine. If your tonsillitis is severe and happens often, you may need to get your tonsils removed (tonsillectomy). Follow these instructions at home:  Rest and sleep often.  Drink enough fluids to keep your pee (urine) clear or pale yellow.  While your throat is sore, eat soft or liquid foods like:  Soup.  Ice cream.  Instant breakfast drinks.  Eat frozen ice pops.  Gargle with a warm or cold liquid to help soothe the throat. Gargle with a water and salt mix. Mix 1/4 teaspoon of salt and 1/4 teaspoon of baking soda in 1 cup of water.  Only take medicines as told by your doctor.  If you are given medicines (antibiotics), take them as told. Finish them even if you start to feel better. Contact a doctor if:  You have large, tender lumps in your neck.  You have a rash.  You cough up green, yellow-brown, or bloody fluid.  You cannot swallow liquids or food for 24 hours.  You notice that only one of your tonsils is swollen. Get help right away if:  You throw up (vomit).  You have a very bad headache.  You have a stiff neck.  You have chest pain.  You have trouble breathing or swallowing.  You have bad throat pain, drooling, or your voice changes.  You have bad pain not helped by medicine.  You cannot fully open your mouth.  You have redness, puffiness, or bad pain in the neck.  You have a fever. This information is not intended to replace advice given to you by your health care provider. Make sure you discuss any questions you have with your health care provider. Document Released: 07/24/2007 Document Revised: 07/13/2015 Document  Reviewed: 07/24/2012 Elsevier Interactive Patient Education  2017 [employer]vier Inc. Strep Throat Strep throat is a bacterial infection of the throat. Your health care provider may call the infection tonsillitis or pharyngitis, depending on whether there is swelling in the tonsils or at the back of the throat. Strep throat is most common during the cold months of the year in children who are 5-30 years of age, but it can happen during any season in people of any age. This infection is spread from person to person (contagious) through coughing, sneezing, or close contact. What are the causes? Strep throat is caused by the bacteria called Streptococcus pyogenes. What increases the risk? This condition is more likely to develop in:  People who spend time in crowded places where the infection can spread easily.  People who have close contact with someone who has strep throat. What are the signs or symptoms? Symptoms of this condition include:  Fever or chills.  Redness, swelling, or pain in the tonsils or throat.  Pain or difficulty when swallowing.  White or yellow spots on the tonsils or throat.  Swollen, tender glands in the neck or under the jaw.  Red rash all over the body (rare). How is this diagnosed? This condition is diagnosed by performing a rapid strep test or by taking a swab of your throat (throat culture test). Results from a rapid strep test are usually ready in a few minutes, but throat culture test results  are available after one or two days. How is this treated? This condition is treated with antibiotic medicine. Follow these instructions at home: Medicines  Take over-the-counter and prescription medicines only as told by your health care provider.  Take your antibiotic as told by your health care provider. Do not stop taking the antibiotic even if you start to feel better.  Have family members who also have a sore throat or fever tested for strep throat. They may need  antibiotics if they have the strep infection. Eating and drinking  Do not share food, drinking cups, or personal items that could cause the infection to spread to other people.  If swallowing is difficult, try eating soft foods until your sore throat feels better.  Drink enough fluid to keep your urine clear or pale yellow. General instructions  Gargle with a salt-water mixture 3-4 times per day or as needed. To make a salt-water mixture, completely dissolve -1 tsp of salt in 1 cup of warm water.  Make sure that all household members wash their hands well.  Get plenty of rest.  Stay home from school or work until you have been taking antibiotics for 24 hours.  Keep all follow-up visits as told by your health care provider. This is important. Contact a health care provider if:  The glands in your neck continue to get bigger.  You develop a rash, cough, or earache.  You cough up a thick liquid that is green, yellow-brown, or bloody.  You have pain or discomfort that does not get better with medicine.  Your problems seem to be getting worse rather than better.  You have a fever. Get help right away if:  You have new symptoms, such as vomiting, severe headache, stiff or painful neck, chest pain, or shortness of breath.  You have severe throat pain, drooling, or changes in your voice.  You have swelling of the neck, or the skin on the neck becomes red and tender.  You have signs of dehydration, such as fatigue, dry mouth, and decreased urination.  You become increasingly sleepy, or you cannot wake up completely.  Your joints become red or painful. This information is not intended to replace advice given to you by your health care provider. Make sure you discuss any questions you have with your health care provider. Document Released: 02/02/2000 Document Revised: 10/04/2015 Document Reviewed: 05/30/2014 Elsevier Interactive Patient Education  2017 [employer] It has been  a pleasure taking care of you! You were admitted due to tract infection and abscess. We have treated you with antibiotics per recommendation by otolaryngologist. With that your symptoms improved to the point we think it is safe to let you go home and follow up with the otolaryngologist if if his symptoms doesn't improve. We are discharging you on more of the antibiotics you need to continue taking after you leave the hospital. Please make sure that you complete the course of antibiotic regardless of how you feel. There could be some changes made to your home medications during this hospitalization. Please, make sure to read the directions before you take them. The names and directions on how to take these medications are found on this discharge paper under medication section.  Take care,

## 2016-03-15 NOTE — Progress Notes (Signed)
Notified Dr. Artist PaisYoo that pt has order for droplet precautions. No order for flu pcr. Pt positive for strep. MD gave order to d/c droplet precautions. MD stated don't need to get flu pcr. Will continue to monitor pt. Nelda MarseilleJenny Thacker, RN

## 2016-03-19 NOTE — Progress Notes (Signed)
SLP late addendum    03/19/16 1237  SLP G-Codes **NOT FOR INPATIENT CLASS**  Functional Assessment Tool Used skilled clinical judgement  Functional Limitations Swallowing  Swallow Current Status (W0981(G8996) [facility]  Swallow Goal Status (X9147(G8997) [facility]  Swallow Discharge Status [phone](G8998) [facility]    Breck CoonsLisa Willis MedwayLitaker M.Ed [employer]-SLP Pager [phone]

## 2016-03-19 NOTE — Progress Notes (Deleted)
SLP entry    03/19/16 1200  SLP G-Codes **NOT FOR INPATIENT CLASS**  Functional Assessment Tool Used skilled clinical judgement  Functional Limitations Swallowing  Swallow Current Status [phone]([id number]) [facility]  Swallow Goal Status (U0454(G8997) CH   Breck CoonsLisa Willis Queen CityLitaker M.Ed [employer]-SLP Pager [phone]

## 2016-03-25 ENCOUNTER — Other Ambulatory Visit: Payer: Self-pay | Admitting: Emergency Medicine

## 2016-03-25 ENCOUNTER — Encounter (HOSPITAL_COMMUNITY): Payer: Self-pay | Admitting: Emergency Medicine

## 2016-03-25 ENCOUNTER — Emergency Department (HOSPITAL_COMMUNITY)
Admission: EM | Admit: 2016-03-25 | Discharge: 2016-03-25 | Disposition: A | Discharge status: Home / Self Care | Payer: BLUE CROSS/BLUE SHIELD | Attending: Emergency Medicine | Admitting: Emergency Medicine

## 2016-03-25 DIAGNOSIS — J36 Peritonsillar abscess: Secondary | ICD-10-CM | POA: Insufficient documentation

## 2016-03-25 DIAGNOSIS — Z8673 Personal history of transient ischemic attack (TIA), and cerebral infarction without residual deficits: Secondary | ICD-10-CM | POA: Insufficient documentation

## 2016-03-25 DIAGNOSIS — F17210 Nicotine dependence, cigarettes, uncomplicated: Secondary | ICD-10-CM | POA: Insufficient documentation

## 2016-03-25 LAB — CBC WITH DIFFERENTIAL/PLATELET
BASOS ABS: 0 10*3/uL (ref 0.0–0.1)
Basophils Relative: 0 %
EOS ABS: 0 10*3/uL (ref 0.0–0.7)
EOS PCT: 0 %
HCT: 43.4 % (ref 39.0–52.0)
Hemoglobin: 14.8 g/dL (ref 13.0–17.0)
LYMPHS PCT: 7 %
Lymphs Abs: 1.4 10*3/uL (ref 0.7–4.0)
MCH: 29.4 pg (ref 26.0–34.0)
MCHC: 34.1 g/dL (ref 30.0–36.0)
MCV: 86.3 fL (ref 78.0–100.0)
MONO ABS: 1.3 10*3/uL — AB (ref 0.1–1.0)
Monocytes Relative: 7 %
Neutro Abs: 16 10*3/uL — ABNORMAL HIGH (ref 1.7–7.7)
Neutrophils Relative %: 86 %
PLATELETS: 218 10*3/uL (ref 150–400)
RBC: 5.03 MIL/uL (ref 4.22–5.81)
RDW: 12.7 % (ref 11.5–15.5)
WBC: 18.7 10*3/uL — AB (ref 4.0–10.5)

## 2016-03-25 LAB — BASIC METABOLIC PANEL
Anion gap: 9 (ref 5–15)
BUN: 8 mg/dL (ref 6–20)
CALCIUM: 8.9 mg/dL (ref 8.9–10.3)
CO2: 23 mmol/L (ref 22–32)
Chloride: 107 mmol/L (ref 101–111)
Creatinine, Ser: 0.89 mg/dL (ref 0.61–1.24)
GFR calc Af Amer: >60 mL/min (ref >60)
GLUCOSE: 110 mg/dL — AB (ref 65–99)
Potassium: 3.7 mmol/L (ref 3.5–5.1)
SODIUM: 139 mmol/L (ref 135–145)

## 2016-03-25 LAB — I-STAT CG4 LACTIC ACID, ED: LACTIC ACID, VENOUS: 1.12 mmol/L (ref 0.5–1.9)

## 2016-03-25 LAB — RAPID STREP SCREEN (MED CTR MEBANE ONLY): STREPTOCOCCUS, GROUP A SCREEN (DIRECT): NEGATIVE

## 2016-03-25 MED ORDER — SODIUM CHLORIDE 0.9 % IV BOLUS (SEPSIS)
1000.0000 mL | Freq: Once | INTRAVENOUS | Status: AC
  Administered 2016-03-25: 1000 mL via INTRAVENOUS

## 2016-03-25 MED ORDER — IOPAMIDOL (ISOVUE-300) INJECTION 61%
INTRAVENOUS | Status: AC
  Filled 2016-03-25: qty 75

## 2016-03-25 MED ORDER — DEXAMETHASONE SODIUM PHOSPHATE 10 MG/ML IJ SOLN
10.0000 mg | Freq: Once | INTRAMUSCULAR | Status: AC
  Administered 2016-03-25: 10 mg via INTRAVENOUS
  Filled 2016-03-25: qty 1

## 2016-03-25 MED ORDER — SODIUM CHLORIDE 0.9 % IV SOLN
3.0000 g | Freq: Once | INTRAVENOUS | Status: DC
  Filled 2016-03-25: qty 3

## 2016-03-25 MED ORDER — MORPHINE SULFATE (PF) 4 MG/ML IV SOLN
4.0000 mg | Freq: Once | INTRAVENOUS | Status: AC
  Administered 2016-03-25: 4 mg via INTRAVENOUS
  Filled 2016-03-25: qty 1

## 2016-03-25 NOTE — ED Notes (Signed)
Steward DroneBrenda, RN notified of patient's temperature

## 2016-03-25 NOTE — Discharge Instructions (Signed)
Go to this address. They are expecting you in the office.

## 2016-03-25 NOTE — ED Notes (Signed)
Patient given a mask to wear. 

## 2016-03-25 NOTE — ED Triage Notes (Signed)
Left tonsil significantly swollen. Pt states he has a hard time swallowing. Pt airway intact.

## 2016-03-25 NOTE — ED Provider Notes (Signed)
[facility]-EMERGENCY DEPT Provider Note   CSN: [id number] Arrival date & time: 03/25/16  1006     History   Chief Complaint Chief Complaint  Patient presents with  . Sore Throat    HPI Richard Ramirez is a 31 y.o. male.  31 year old African-American male with no significant past medical history presents to the ED today with complaints of dysphagia, sore throat, fevers. Patient states that his symptoms started last night. States it is painful to swallow left side. Difficulty opening his mouth. He is able take anything by mouth due to the swelling. States his breathing is "okay". Patient did not take anything at home for the pain or fevers. States she has strep throat in the past and this feels same. States he has had trouble swallowing. Denies any drooling. Complains of left lateral neck pain with movement. Denies any sick contacts. Denies any other associated symptoms.      Past Medical History:  Diagnosis Date  . Bell's palsy 01/2015  . Reflux   . Stroke [facility] 01/2015   Per his report.     Patient Active Problem List   Diagnosis Date Noted  . Tonsillar abscess 03/14/2016  . Strep pharyngitis   . Weakness 02/13/2015  . 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 FINGER SURGERY Right 2002   R small finger  . WRIST SURGERY Left 2010       Home Medications    Prior to Admission medications   Medication Sig Start Date End Date Taking? Authorizing Provider  acetaminophen (TYLENOL) 325 MG tablet Take 2 tablets (650 mg total) by mouth every 6 (six) hours as needed for mild pain, moderate pain, fever or headache. 03/15/16   Almon Hercules, MD  chlorhexidine (PERIDEX) 0.12 % solution 15 mLs by Mouth Rinse route 2 (two) times daily. 03/15/16   Almon Hercules, MD  clindamycin (CLEOCIN) 300 MG capsule Take 1 capsule (300 mg total) by mouth every 6 (six) hours. 03/15/16   Almon Hercules, MD  ibuprofen (ADVIL,MOTRIN) 600 MG tablet Take 1 tablet (600 mg  total) by mouth every 8 (eight) hours as needed. Patient not taking: Reported on 03/14/2016 03/09/15   Azalia Bilis, MD    Family History Family History  Problem Relation Age of Onset  . Hyperlipidemia Mother     Social History Social History  Substance Use Topics  . Smoking status: Current Some Day Smoker    Types: Cigarettes  . Smokeless tobacco: Never Used  . Alcohol use Yes     Comment: occasional     Allergies   Patient has no known allergies.   Review of Systems Review of Systems  Constitutional: Positive for chills and fever.  HENT: Positive for sore throat, trouble swallowing and voice change. Negative for congestion, drooling and rhinorrhea.   Eyes: Negative for visual disturbance.  Respiratory: Negative for cough and shortness of breath.   Cardiovascular: Negative for chest pain.  Gastrointestinal: Negative for abdominal pain, diarrhea, nausea and vomiting.  Musculoskeletal: Positive for neck pain. Negative for neck stiffness.  Skin: Negative.   Neurological: Negative for dizziness, syncope, weakness, light-headedness and headaches.     Physical Exam Updated Vital Signs BP 129/84   Pulse 101   Temp 99.5 F (37.5 C) (Oral)   Resp 18   Ht 6\' 3"  (1.905 m)   Wt 136.1 kg   SpO2 96%   BMI 37.50 kg/m   Physical Exam  Constitutional: He is  oriented to person, place, and time. He appears well-developed and well-nourished. No distress.  Patient with muffled voice.  HENT:  Head: Normocephalic and atraumatic.  Right Ear: Tympanic membrane, external ear and ear canal normal.  Left Ear: Tympanic membrane, external ear and ear canal normal.  Nose: Nose normal.  Mouth/Throat: Mucous membranes are normal. Posterior oropharyngeal edema, posterior oropharyngeal erythema and tonsillar abscesses present. No oropharyngeal exudate. Tonsils are 0 on the right. Tonsils are 3+ on the left. No tonsillar exudate.  Trismus noted. Uvula is deviated to the right. Left tonsil is  3+. Patient with muffled voice. Is able to talk in complete sentences. Oropharynx without erythema, edema, no exudates. Patient with difficulty swallowing. No drooling noted. No tripoding noted.  Eyes: Conjunctivae are normal. Right eye exhibits no discharge. Left eye exhibits no discharge. No scleral icterus.  Neck: Normal range of motion. Neck supple. No thyromegaly present.  Cardiovascular: Regular rhythm, normal heart sounds and intact distal pulses.  Exam reveals no gallop and no friction rub.   No murmur heard. Tachycardia.  Pulmonary/Chest: Effort normal and breath sounds normal. No respiratory distress. He exhibits no tenderness.  Patient managing his airway.  Musculoskeletal: Normal range of motion.  Lymphadenopathy:    He has cervical adenopathy (left worse than the right).  Neurological: He is alert and oriented to person, place, and time.  Skin: Skin is warm and dry. Capillary refill takes less than 2 seconds.  Nursing note and vitals reviewed.    ED Treatments / Results  Labs (all labs ordered are listed, but only abnormal results are displayed) Labs Reviewed  BASIC METABOLIC PANEL - Abnormal; Notable for the following:       Result Value   Glucose, Bld 110 (*)    All other components within normal limits  CBC WITH DIFFERENTIAL/PLATELET - Abnormal; Notable for the following:    WBC 18.7 (*)    Neutro Abs 16.0 (*)    Monocytes Absolute 1.3 (*)    All other components within normal limits  RAPID STREP SCREEN (NOT AT [facility])  I-STAT CG4 LACTIC ACID, ED    EKG  EKG Interpretation None       Radiology No results found.  Procedures Procedures (including critical care time)  Medications Ordered in ED Medications  dexamethasone (DECADRON) injection 10 mg (not administered)  morphine 4 MG/ML injection 4 mg (not administered)  sodium chloride 0.9 % bolus 1,000 mL (not administered)  Ampicillin-Sulbactam (UNASYN) 3 g in sodium chloride 0.9 % 100 mL IVPB (not  administered)     Initial Impression / Assessment and Plan / ED Course  I have reviewed the triage vital signs and the nursing notes.  Pertinent labs & imaging results that were available during my care of the patient were reviewed by me and considered in my medical decision making (see chart for details).     The patient resents to the ED with concern for peritonsillar abscess. Strep test was negative. On my exam patient has right uvula deviation. Enlarged 3+ to 4+ tonsil left. Patient with mild trismus. Dysphagia but maintaining his airway. Patient is not tachypenic or hypoxic. Patient with a fever of 101 in triage and tachycardic. Patient able swallow Tylenol. Offered patient suppository and he refused. Patient given a fluid bolus with improvement of his fever to 99.5. Heart rate improved to 97. Patient with a leukocytosis of 18. Lactic acid was normal. All other labs are unremarkable. Spoke with Dr. Jenne Pane who will see patient in the office  across the street. Does not recommend CT or antibiotics. Patient was given Decadron and feels slightly improved and his swallowing. Patient is not drooling. Air is patent. Patient is not tripoding. He is managing his secretions and maintaining his airway. He does not appear to be toxic. Patient was given morphine in the ED for pain. Has no other ride. Patient was given a taxi voucher taken to this appointment. They are expecting in the office 1:15. Pt is hemodynamically stable, in NAD, & able to ambulate in the ED. Pain has been managed & has no complaints prior to dc. Pt is comfortable with above plan and is stable for discharge at this time. All questions were answered prior to disposition. Strict return precautions for f/u to the ED were discussed. Patient was discussed with Dr. Karma GanjaLinker who agrees with the above plan.   Final Clinical Impressions(s) / ED Diagnoses   Final diagnoses:  Peritonsillar abscess    New Prescriptions New Prescriptions   No  medications on file     Rise MuKenneth T Precious Gilchrest, PA-C 03/25/16 1317    Jerelyn ScottMartha Linker, MD 03/25/16 1431

## 2016-03-25 NOTE — ED Notes (Signed)
Pt unable to swallow tylenol for fever.

## 2016-03-25 NOTE — ED Triage Notes (Signed)
Pt states he started having sore throat/ tonsil swelling. Pt states he thinks he has strep throat. Pt feels like he has trouble swallowing. Hx of strep and felt the same way.

## 2016-03-27 LAB — CULTURE, GROUP A STREP (THRC)

## 2016-08-14 ENCOUNTER — Emergency Department: Payer: Self-pay

## 2016-08-14 ENCOUNTER — Encounter: Payer: Self-pay | Admitting: *Deleted

## 2016-08-14 ENCOUNTER — Emergency Department
Admission: EM | Admit: 2016-08-14 | Discharge: 2016-08-14 | Disposition: A | Discharge status: Home / Self Care | Payer: Self-pay | Attending: Emergency Medicine | Admitting: Emergency Medicine

## 2016-08-14 DIAGNOSIS — R079 Chest pain, unspecified: Secondary | ICD-10-CM

## 2016-08-14 DIAGNOSIS — F17210 Nicotine dependence, cigarettes, uncomplicated: Secondary | ICD-10-CM | POA: Insufficient documentation

## 2016-08-14 DIAGNOSIS — R0789 Other chest pain: Secondary | ICD-10-CM | POA: Insufficient documentation

## 2016-08-14 DIAGNOSIS — R0602 Shortness of breath: Secondary | ICD-10-CM | POA: Insufficient documentation

## 2016-08-14 LAB — CBC
HEMATOCRIT: 42 % (ref 40.0–52.0)
HEMOGLOBIN: 14.5 g/dL (ref 13.0–18.0)
MCH: 29.2 pg (ref 26.0–34.0)
MCHC: 34.5 g/dL (ref 32.0–36.0)
MCV: 84.7 fL (ref 80.0–100.0)
Platelets: 230 10*3/uL (ref 150–440)
RBC: 4.96 MIL/uL (ref 4.40–5.90)
RDW: 12.8 % (ref 11.5–14.5)
WBC: 7.9 10*3/uL (ref 3.8–10.6)

## 2016-08-14 LAB — BASIC METABOLIC PANEL
ANION GAP: 6 (ref 5–15)
BUN: 17 mg/dL (ref 6–20)
CALCIUM: 9.1 mg/dL (ref 8.9–10.3)
CO2: 24 mmol/L (ref 22–32)
Chloride: 105 mmol/L (ref 101–111)
Creatinine, Ser: 1.05 mg/dL (ref 0.61–1.24)
GLUCOSE: 96 mg/dL (ref 65–99)
POTASSIUM: 3.7 mmol/L (ref 3.5–5.1)
Sodium: 135 mmol/L (ref 135–145)

## 2016-08-14 LAB — BRAIN NATRIURETIC PEPTIDE: B Natriuretic Peptide: 14 pg/mL (ref 0.0–100.0)

## 2016-08-14 LAB — TROPONIN I: Troponin I: <0.03 ng/mL (ref <0.03)

## 2016-08-14 MED ORDER — ENOXAPARIN SODIUM 150 MG/ML ~~LOC~~ SOLN
1.0000 mg/kg | Freq: Once | SUBCUTANEOUS | Status: AC
  Administered 2016-08-14: 140 mg via SUBCUTANEOUS
  Filled 2016-08-14: qty 0.94

## 2016-08-14 MED ORDER — IOPAMIDOL (ISOVUE-370) INJECTION 76%
75.0000 mL | Freq: Once | INTRAVENOUS | Status: AC | PRN
  Administered 2016-08-14: 75 mL via INTRAVENOUS
  Filled 2016-08-14: qty 75

## 2016-08-14 MED ORDER — KETOROLAC TROMETHAMINE 30 MG/ML IJ SOLN
30.0000 mg | Freq: Once | INTRAMUSCULAR | Status: AC
  Administered 2016-08-14: 30 mg via INTRAVENOUS
  Filled 2016-08-14: qty 1

## 2016-08-14 MED ORDER — DICLOFENAC SODIUM 1 % TD GEL: 2.0000 g | Freq: Four times a day (QID) | TRANSDERMAL | 0 refills | Status: DC

## 2016-08-14 MED ORDER — IBUPROFEN 600 MG PO TABS: 600.0000 mg | ORAL_TABLET | Freq: Three times a day (TID) | ORAL | 0 refills | Status: DC | PRN

## 2016-08-14 MED ORDER — OXYCODONE-ACETAMINOPHEN 5-325 MG PO TABS
1.0000 | ORAL_TABLET | Freq: Once | ORAL | Status: AC
  Administered 2016-08-14: 1 via ORAL
  Filled 2016-08-14: qty 1

## 2016-08-14 NOTE — ED Notes (Signed)
Patient transported to CT 

## 2016-08-14 NOTE — ED Notes (Signed)
Lab called and informed to add on troponin and BNP to existing blood work in lab.

## 2016-08-14 NOTE — ED Notes (Signed)
Pt discharged to home.  Friend driving.  Discharge instructions reviewed.  Verbalized understanding.  No questions or concerns at this time.  Teach back verified.  Pt in NAD.  No items left in ED.   

## 2016-08-14 NOTE — ED Provider Notes (Signed)
[facility] Emergency Department Provider Note  ____________________________________________   First MD Initiated Contact with Patient 08/14/16 1427     (approximate)  I have reviewed the triage vital signs and the nursing notes.   HISTORY  Chief Complaint Chest Pain    HPI Richard Ramirez is a 31 y.o. male who comes to the emergency department with sudden onset sharp aching right upper chest pain associated with shortness of breath that began while at work. His pain is moderate to severe worse with deep inspiration and worse when raising his right arm. He has a complex past medical history including what he states are multiple "mini strokes" as well as a previous "blood clot in my leg". He says he completed 6 months of medication for the blood clot and is currently taking no medications. He denies leg swelling. He denies recent surgery or immobilization. He denies hemoptysis. His pain is nonexertional.   Past Medical History:  Diagnosis Date  . Bell's palsy 01/2015  . Reflux   . Stroke [facility]) 01/2015   Per his report.     Patient Active Problem List   Diagnosis Date Noted  . Tonsillar abscess 03/14/2016  . Strep pharyngitis   . Weakness 02/13/2015  . 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 FINGER SURGERY Right 2002   R small finger  . WRIST SURGERY Left 2010    Prior to Admission medications   Medication Sig Start Date End Date Taking? Authorizing Provider  acetaminophen (TYLENOL) 325 MG tablet Take 2 tablets (650 mg total) by mouth every 6 (six) hours as needed for mild pain, moderate pain, fever or headache. 03/15/16   Almon Hercules, MD  chlorhexidine (PERIDEX) 0.12 % solution 15 mLs by Mouth Rinse route 2 (two) times daily. 03/15/16   Almon Hercules, MD  clindamycin (CLEOCIN) 300 MG capsule Take 1 capsule (300 mg total) by mouth every 6 (six) hours. 03/15/16   Almon Hercules, MD  diclofenac sodium  (VOLTAREN) 1 % GEL Apply 2 g topically 4 (four) times daily. 08/14/16   Merrily Brittle, MD  ibuprofen (ADVIL,MOTRIN) 600 MG tablet Take 1 tablet (600 mg total) by mouth every 8 (eight) hours as needed. 08/14/16   Merrily Brittle, MD    Allergies Patient has no known allergies.  Family History  Problem Relation Age of Onset  . Hyperlipidemia Mother     Social History Social History  Substance Use Topics  . Smoking status: Current Some Day Smoker    Types: Cigarettes  . Smokeless tobacco: Never Used  . Alcohol use Yes     Comment: occasional    Review of Systems Constitutional: No fever/chills Eyes: No visual changes. ENT: No sore throat. Cardiovascular: Positive chest pain. Respiratory: Positive shortness of breath. Gastrointestinal: No abdominal pain.  No nausea, no vomiting.  No diarrhea.  No constipation. Genitourinary: Negative for dysuria. Musculoskeletal: Negative for back pain. Skin: Negative for rash. Neurological: Negative for headaches, focal weakness or numbness.   ____________________________________________   PHYSICAL EXAM:  VITAL SIGNS: ED Triage Vitals  Enc Vitals Group     BP 08/14/16 1209 136/88     Pulse Rate 08/14/16 1209 92     Resp 08/14/16 1209 18     Temp 08/14/16 1209 98.9 F (37.2 C)     Temp Source 08/14/16 1209 Oral     SpO2 08/14/16 1209 99 %     Weight 08/14/16 1207 (!) 310  lb (140.6 kg)     Height 08/14/16 1207 6\' 3"  (1.905 m)     Head Circumference --      Peak Flow --      Pain Score 08/14/16 1207 10     Pain Loc --      Pain Edu? --      Excl. in GC? --     Constitutional: Alert and oriented 4 appears uncomfortable nontoxic no diaphoresis speaks full clear sentences Eyes: PERRL EOMI. Head: Atraumatic. Nose: No congestion/rhinnorhea. Mouth/Throat: No trismus Neck: No stridor.   Cardiovascular: Normal rate, regular rhythm. Grossly normal heart sounds.  Good peripheral circulation. Respiratory: Taking somewhat shallow  breaths splinting he is focally extremely tender over his right anterior chest Gastrointestinal: Obese nontender Musculoskeletal: No lower extremity edema  legs are equal in size Neurologic:  Normal speech and language. No gross focal neurologic deficits are appreciated. Skin:  Skin is warm, dry and intact. No rash noted. Psychiatric: Mood and affect are normal. Speech and behavior are normal.    ____________________________________________   DIFFERENTIAL includes but not limited to  Acute coronary syndrome, pulmonary embolism, pneumothorax, pericarditis, myocarditis, aortic dissection, Boerhaave syndrome ____________________________________________   LABS (all labs ordered are listed, but only abnormal results are displayed)  Labs Reviewed  BASIC METABOLIC PANEL  CBC  TROPONIN I  TROPONIN I  BRAIN NATRIURETIC PEPTIDE    No signs of acute ischemia 2 __________________________________________  EKG  ED ECG REPORT I, Merrily BrittleNeil Reve Crocket, the attending physician, personally viewed and interpreted this ECG.  Date: 08/14/2016 Rate: 86 Rhythm: normal sinus rhythm QRS Axis: normal Intervals: normal ST/T Wave abnormalities: normal Narrative Interpretation: unremarkable  ____________________________________________  RADIOLOGY  CT scan of the chest with no pulmonary embolism in no acute pathology ____________________________________________   PROCEDURES  Procedure(s) performed: no  Procedures  Critical Care performed: no  Observation: no ____________________________________________   INITIAL IMPRESSION / ASSESSMENT AND PLAN / ED COURSE  Pertinent labs & imaging results that were available during my care of the patient were reviewed by me and considered in my medical decision making (see chart for details).  The patient arrives uncomfortable appearing with pleuritic sounding chest pain. The setting of previous known DVT and concerned about a pulmonary embolism. We  will get a second troponin as well as a CT scan.     ----------------------------------------- 4:53 PM on 08/14/2016 -----------------------------------------  Fortunately the patient's second troponin is negative and his CT angiogram is negative for aortic disaster as well as pulmonary embolism. He is focally quite tender over his right anterior chest wall which raises concern for possible costochondritis. No treat patient symptomatically with NSAIDs and refer him to primary care for follow-up. ____________________________________________   FINAL CLINICAL IMPRESSION(S) / ED DIAGNOSES  Final diagnoses:  Chest pain, unspecified type  Chest wall pain      NEW MEDICATIONS STARTED DURING THIS VISIT:  Discharge Medication List as of 08/14/2016  4:53 PM    START taking these medications   Details  diclofenac sodium (VOLTAREN) 1 % GEL Apply 2 g topically 4 (four) times daily., Starting Wed 08/14/2016, Print         Note:  This document was prepared using Dragon voice recognition software and may include unintentional dictation errors.     Merrily Brittleifenbark, Dana Dorner, MD 08/14/16 2322

## 2016-08-14 NOTE — Discharge Instructions (Signed)
Please make an appointment to establish care with a primary care physician within one week for reexamination. Return to the emergency department if your pain is not nearly gone within 24 hours. Return sooner for any new or worsening symptoms such as worsening shortness of breath, fevers, chills, or for any other concerns whatsoever.  It was a pleasure to take care of you today, and thank you for coming to our emergency department.  If you have any questions or concerns before leaving please ask the nurse to grab me and I'm more than happy to go through your aftercare instructions again.  If you were prescribed any opioid pain medication today such as Norco, Vicodin, Percocet, morphine, hydrocodone, or oxycodone please make sure you do not drive when you are taking this medication as it can alter your ability to drive safely.  If you have any concerns once you are home that you are not improving or are in fact getting worse before you can make it to your follow-up appointment, please do not hesitate to call 911 and come back for further evaluation.  Merrily Brittle MD  Results for orders placed or performed during the hospital encounter of 08/14/16  Basic metabolic panel  Result Value Ref Range   Sodium 135 135 - 145 mmol/L   Potassium 3.7 3.5 - 5.1 mmol/L   Chloride 105 101 - 111 mmol/L   CO2 24 22 - 32 mmol/L   Glucose, Bld 96 65 - 99 mg/dL   BUN 17 6 - 20 mg/dL   Creatinine, Ser 7.82 0.61 - 1.24 mg/dL   Calcium 9.1 8.9 - 95.6 mg/dL   GFR calc non Af Amer >60 >60 mL/min   GFR calc Af Amer >60 >60 mL/min   Anion gap 6 5 - 15  CBC  Result Value Ref Range   WBC 7.9 3.8 - 10.6 K/uL   RBC 4.96 4.40 - 5.90 MIL/uL   Hemoglobin 14.5 13.0 - 18.0 g/dL   HCT 21.3 08.6 - 57.8 %   MCV 84.7 80.0 - 100.0 fL   MCH 29.2 26.0 - 34.0 pg   MCHC 34.5 32.0 - 36.0 g/dL   RDW 46.9 62.9 - 52.8 %   Platelets 230 150 - 440 K/uL  Troponin I  Result Value Ref Range   Troponin I <0.03 <0.03 ng/mL  Troponin I   Result Value Ref Range   Troponin I <0.03 <0.03 ng/mL  Brain natriuretic peptide  Result Value Ref Range   B Natriuretic Peptide 14.0 0.0 - 100.0 pg/mL   Dg Chest 2 View  Result Date: 08/14/2016 CLINICAL DATA:  Predominantly right-sided chest pain, acute onset. EXAM: CHEST  2 VIEW COMPARISON:  11/17/2014 FINDINGS: The cardiac silhouette appears mildly enlarged, accentuated by low lung volumes. The lungs are hypoinflated with mild peribronchial thickening noted. No confluent airspace opacity, overt pulmonary edema, sizable pleural effusion, or pneumothorax is identified. No acute osseous abnormality is seen. IMPRESSION: Shallow inspiration. Mild peribronchial thickening which may reflect bronchitis. Electronically Signed   By: Sebastian Ache M.D.   On: 08/14/2016 12:33   Ct Angio Chest Pe W/cm &/or Wo Cm  Result Date: 08/14/2016 CLINICAL DATA:  Pleuritic chest pain and history of DVT. Shortness of breath. EXAM: CT ANGIOGRAPHY CHEST WITH CONTRAST TECHNIQUE: Multidetector CT imaging of the chest was performed using the standard protocol during bolus administration of intravenous contrast. Multiplanar CT image reconstructions and MIPs were obtained to evaluate the vascular anatomy. CONTRAST:  75 mL Isovue 370 COMPARISON:  Chest radiograph 08/14/2016 FINDINGS: Cardiovascular: Contrast injection is sufficient to demonstrate satisfactory opacification of the pulmonary arteries to the segmental level. There is no pulmonary embolus. The main pulmonary artery is within normal limits for size. There is no CT evidence of acute right heart strain. The visualized aorta is normal. There is a normal 3-vessel arch branching pattern. Heart size is normal, without pericardial effusion. Mediastinum/Nodes: No mediastinal, hilar or axillary lymphadenopathy. The visualized thyroid and thoracic esophageal course are unremarkable. Lungs/Pleura: No pulmonary nodules or masses. No pleural effusion or pneumothorax. No focal  airspace consolidation. No focal pleural abnormality. Upper Abdomen: Contrast bolus timing is not optimized for evaluation of the abdominal organs. Within this limitation, the visualized organs of the upper abdomen are normal. Musculoskeletal: No chest wall abnormality. No acute or significant osseous findings. Review of the MIP images confirms the above findings. IMPRESSION: No pulmonary embolus, acute aortic syndrome or other acute thoracic abnormality. Electronically Signed   By: Deatra RobinsonKevin  Herman M.D.   On: 08/14/2016 15:41

## 2016-08-14 NOTE — ED Triage Notes (Signed)
States chest pain that began this AM at work, states mid right side heaviness, pt works outdoors, states when he lifts his arms the pain gets worse and he gets SOB, awake and alert in no acute distress at this time

## 2016-11-17 ENCOUNTER — Emergency Department (HOSPITAL_COMMUNITY)
Admission: EM | Admit: 2016-11-17 | Discharge: 2016-11-18 | Disposition: A | Discharge status: Home / Self Care | Payer: Self-pay | Attending: Emergency Medicine | Admitting: Emergency Medicine

## 2016-11-17 ENCOUNTER — Encounter (HOSPITAL_COMMUNITY): Payer: Self-pay | Admitting: Emergency Medicine

## 2016-11-17 DIAGNOSIS — F17210 Nicotine dependence, cigarettes, uncomplicated: Secondary | ICD-10-CM | POA: Insufficient documentation

## 2016-11-17 DIAGNOSIS — M542 Cervicalgia: Secondary | ICD-10-CM

## 2016-11-17 DIAGNOSIS — R202 Paresthesia of skin: Secondary | ICD-10-CM | POA: Insufficient documentation

## 2016-11-17 DIAGNOSIS — M5410 Radiculopathy, site unspecified: Secondary | ICD-10-CM | POA: Insufficient documentation

## 2016-11-17 NOTE — ED Triage Notes (Addendum)
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 CMS intact. +nausea

## 2016-11-18 ENCOUNTER — Encounter (HOSPITAL_COMMUNITY): Payer: Self-pay | Admitting: Medical

## 2016-11-18 ENCOUNTER — Emergency Department (HOSPITAL_COMMUNITY): Payer: Self-pay

## 2016-11-18 LAB — I-STAT CHEM 8, ED
BUN: 14 mg/dL (ref 6–20)
CREATININE: 1.1 mg/dL (ref 0.61–1.24)
Calcium, Ion: 1.13 mmol/L — ABNORMAL LOW (ref 1.15–1.40)
Chloride: 103 mmol/L (ref 101–111)
Glucose, Bld: 92 mg/dL (ref 65–99)
HEMATOCRIT: 42 % (ref 39.0–52.0)
HEMOGLOBIN: 14.3 g/dL (ref 13.0–17.0)
POTASSIUM: 3.2 mmol/L — AB (ref 3.5–5.1)
Sodium: 141 mmol/L (ref 135–145)
TCO2: 26 mmol/L (ref 22–32)

## 2016-11-18 MED ORDER — HYDROCODONE-ACETAMINOPHEN 5-325 MG PO TABS: 1.0000 | ORAL_TABLET | Freq: Four times a day (QID) | ORAL | 0 refills | Status: DC | PRN

## 2016-11-18 MED ORDER — IBUPROFEN 400 MG PO TABS
600.0000 mg | ORAL_TABLET | Freq: Once | ORAL | Status: AC
  Administered 2016-11-18: 600 mg via ORAL
  Filled 2016-11-18: qty 1

## 2016-11-18 MED ORDER — HYDROCODONE-ACETAMINOPHEN 5-325 MG PO TABS
1.0000 | ORAL_TABLET | Freq: Once | ORAL | Status: AC
  Administered 2016-11-18: 1 via ORAL
  Filled 2016-11-18: qty 1

## 2016-11-18 MED ORDER — CYCLOBENZAPRINE HCL 10 MG PO TABS: 10.0000 mg | ORAL_TABLET | Freq: Two times a day (BID) | ORAL | 0 refills | Status: DC | PRN

## 2016-11-18 MED ORDER — IOPAMIDOL (ISOVUE-370) INJECTION 76%
INTRAVENOUS | Status: AC
  Administered 2016-11-18: 100 mL
  Filled 2016-11-18: qty 100

## 2016-11-18 NOTE — Discharge Instructions (Signed)
Take pain medicine as needed - this medicine can make you drowsy so do not take before driving or while at work Take Ibuprofen  three times a day Follow up with neurosurgery

## 2016-11-18 NOTE — ED Notes (Signed)
IV attempted x 2 by second RN without success.  

## 2016-11-18 NOTE — ED Notes (Signed)
PT states understanding of care given, follow up care, and medication prescribed. PT is ambulated from ED to car with a steady gait.  

## 2016-11-18 NOTE — ED Notes (Signed)
IV established using ultrasound.  Needle visualized in the vein free from the wall.  Blood return noted and flushed without difficulty.  Patient tolerated procedure well.   

## 2016-11-18 NOTE — ED Notes (Signed)
IV attemped x 1 without success

## 2016-11-18 NOTE — ED Provider Notes (Signed)
[facility]-EMERGENCY DEPT Provider Note   CSN: [id number] Arrival date & time: 11/17/16  2207     History   Chief Complaint Chief Complaint  Patient presents with  . Neck pain    HPI Richard Ramirez is a 31 y.o. male who presents with neck pain. PMH significant for hx of Bell's Palsy. He states that he had an acute onset of left sided neck pain with tingling down the left arm at ~8:30PM tonight while at work. The pain is constant and radiates from the left lateral and posterior neck to the left deltoid area. The pain is constant and feels sharp and sometimes burning. 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 Nothing makes the pain better. He took a Tylenol without relief. Certain movements make the pain worse. He denies any fever or weakness of the extremities. He has had tingling in the arm before but never this constant or severe.   HPI  Past Medical History:  Diagnosis Date  . Bell's palsy 01/2015  . Reflux     Patient Active Problem List   Diagnosis Date Noted  . Tonsillar abscess 03/14/2016  . Strep pharyngitis   . Weakness 02/13/2015  . 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 FINGER SURGERY Right 2002   R small finger  . WRIST SURGERY Left 2010       Home Medications    Prior to Admission medications   Medication Sig Start Date End Date Taking? Authorizing Provider  acetaminophen (TYLENOL) 325 MG tablet Take 2 tablets (650 mg total) by mouth every 6 (six) hours as needed for mild pain, moderate pain, fever or headache. 03/15/16  Yes Gonfa, Boyce Medici, MD  ibuprofen (ADVIL,MOTRIN) 600 MG tablet Take 1 tablet (600 mg total) by mouth every 8 (eight) hours as needed. Patient taking differently: Take 600 mg by mouth every 8 (eight) hours as needed for moderate pain.  08/14/16  Yes Merrily Brittle, MD    Family History Family History  Problem Relation Age of Onset  . Hyperlipidemia Mother      Social History Social History  Substance Use Topics  . Smoking status: Current Some Day Smoker    Types: Cigarettes  . Smokeless tobacco: Never Used  . Alcohol use Yes     Comment: occasional     Allergies   Patient has no known allergies.   Review of Systems Review of Systems  Constitutional: Negative for fever.  Musculoskeletal: Positive for myalgias and neck pain. Negative for gait problem.  Skin: Negative for wound.  Neurological: Negative for weakness and numbness.       +tingling  All other systems reviewed and are negative.    Physical Exam Updated Vital Signs BP 128/89 (BP Location: Right Arm)   Pulse 93   Temp (!) 97.4 F (36.3 C) (Oral)   Resp 16   Ht  (1.905 m)   Wt (!) 136.5 kg (301 lb)   SpO2 99%   BMI 37.62 kg/m   Physical Exam  Constitutional: He is oriented to person, place, and time. He appears well-developed and well-nourished. No distress.  Overweight, calm, cooperative  HENT:  Head: Normocephalic and atraumatic.  Eyes: Pupils are equal, round, and reactive to light. Conjunctivae are normal. Right eye exhibits no discharge. Left eye exhibits no discharge. No scleral icterus.  Neck: Normal range of motion. Muscular tenderness (Tenderness over cervical paraspinal muscles and  left SCM) present.  Pain with ROM.  Left trapezius tenderness  Cardiovascular: Normal rate.   Pulmonary/Chest: Effort normal. No respiratory distress.  Abdominal: He exhibits no distension.  Musculoskeletal:  Left upper extremity: No obvious swelling, deformity, or warmth. Tenderness to palpation of anterior shoulder and biceps. FROM with pain. 5/5 strength. Subjective tingling of index and middle finger   Neurological: He is alert and oriented to person, place, and time.  Skin: Skin is warm and dry.  Psychiatric: He has a normal mood and affect. His behavior is normal.  Nursing note and vitals reviewed.    ED Treatments / Results  Labs (all labs ordered  are listed, but only abnormal results are displayed) Labs Reviewed  I-STAT CHEM 8, ED - Abnormal; Notable for the following:       Result Value   Potassium 3.2 (*)    Calcium, Ion 1.13 (*)    All other components within normal limits    EKG  EKG Interpretation None       Radiology Ct Angio Head W Or Wo Contrast  Result Date: 11/18/2016 CLINICAL DATA:  31 y/o M; lateral neck pain with paresthesias radiating down the left arm to the finger tips. EXAM: CT ANGIOGRAPHY HEAD AND NECK TECHNIQUE: Multidetector CT imaging of the head and neck was performed using the standard protocol during bolus administration of intravenous contrast. Multiplanar CT image reconstructions and MIPs were obtained to evaluate the vascular anatomy. Carotid stenosis measurements (when applicable) are obtained utilizing NASCET criteria, using the distal internal carotid diameter as the denominator. CONTRAST:  75 cc Isovue 370 COMPARISON:  11/18/2016 CT of the cervical spine. FINDINGS: CT HEAD FINDINGS Brain: No evidence of acute infarction, hemorrhage, hydrocephalus, extra-axial collection or mass lesion/mass effect. Vascular: As below. Skull: Normal. Negative for fracture or focal lesion. Sinuses: Imaged portions are clear. Orbits: No acute finding. Review of the MIP images confirms the above findings CTA NECK FINDINGS Aortic arch: Standard branching. Imaged portion shows no evidence of aneurysm or dissection. No significant stenosis of the major arch vessel origins. Right carotid system: No evidence of dissection, stenosis (50% or greater) or occlusion. Left carotid system: No evidence of dissection, stenosis (50% or greater) or occlusion. Vertebral arteries: Codominant. No evidence of dissection, stenosis (50% or greater) or occlusion. Skeleton: Negative. Other neck: Negative. Upper chest: Negative. Review of the MIP images confirms the above findings CTA HEAD FINDINGS Anterior circulation: No significant stenosis, proximal  occlusion, aneurysm, or vascular malformation. Posterior circulation: No significant stenosis, proximal occlusion, aneurysm, or vascular malformation. Venous sinuses: As permitted by contrast timing, patent. Anatomic variants: Patent anterior communicating artery. No posterior communicating artery identified, likely hypoplastic or absent. Delayed phase: No abnormal intracranial enhancement. Review of the MIP images confirms the above findings IMPRESSION: 1. Patent carotid and vertebral arteries. No dissection, aneurysm, or hemodynamically significant stenosis utilizing NASCET criteria. 2. Patent circle of Willis. No large vessel occlusion, aneurysm, or significant stenosis. 3. Normal noncontrast CT of the head. No abnormal enhancement of the brain. Electronically Signed   By: Mitzi Hansen M.D.   On: 11/18/2016 04:26   Ct Angio Neck W And/or Wo Contrast  Result Date: 11/18/2016 CLINICAL DATA:  31 y/o M; lateral neck pain with paresthesias radiating down the left arm to the finger tips. EXAM: CT ANGIOGRAPHY HEAD AND NECK TECHNIQUE: Multidetector CT imaging of the head and neck was performed using the standard protocol during bolus administration of intravenous contrast. Multiplanar CT image reconstructions and MIPs were obtained to  evaluate the vascular anatomy. Carotid stenosis measurements (when applicable) are obtained utilizing NASCET criteria, using the distal internal carotid diameter as the denominator. CONTRAST:  75 cc Isovue 370 COMPARISON:  11/18/2016 CT of the cervical spine. FINDINGS: CT HEAD FINDINGS Brain: No evidence of acute infarction, hemorrhage, hydrocephalus, extra-axial collection or mass lesion/mass effect. Vascular: As below. Skull: Normal. Negative for fracture or focal lesion. Sinuses: Imaged portions are clear. Orbits: No acute finding. Review of the MIP images confirms the above findings CTA NECK FINDINGS Aortic arch: Standard branching. Imaged portion shows no evidence of  aneurysm or dissection. No significant stenosis of the major arch vessel origins. Right carotid system: No evidence of dissection, stenosis (50% or greater) or occlusion. Left carotid system: No evidence of dissection, stenosis (50% or greater) or occlusion. Vertebral arteries: Codominant. No evidence of dissection, stenosis (50% or greater) or occlusion. Skeleton: Negative. Other neck: Negative. Upper chest: Negative. Review of the MIP images confirms the above findings CTA HEAD FINDINGS Anterior circulation: No significant stenosis, proximal occlusion, aneurysm, or vascular malformation. Posterior circulation: No significant stenosis, proximal occlusion, aneurysm, or vascular malformation. Venous sinuses: As permitted by contrast timing, patent. Anatomic variants: Patent anterior communicating artery. No posterior communicating artery identified, likely hypoplastic or absent. Delayed phase: No abnormal intracranial enhancement. Review of the MIP images confirms the above findings IMPRESSION: 1. Patent carotid and vertebral arteries. No dissection, aneurysm, or hemodynamically significant stenosis utilizing NASCET criteria. 2. Patent circle of Willis. No large vessel occlusion, aneurysm, or significant stenosis. 3. Normal noncontrast CT of the head. No abnormal enhancement of the brain. Electronically Signed   By: Mitzi Hansen M.D.   On: 11/18/2016 04:26   Ct Cervical Spine Wo Contrast  Result Date: 11/18/2016 CLINICAL DATA:  31 y/o M; left lateral neck pain radiating down the left arm to the finger tips. EXAM: CT CERVICAL SPINE WITHOUT CONTRAST TECHNIQUE: Multidetector CT imaging of the cervical spine was performed without intravenous contrast. Multiplanar CT image reconstructions were also generated. COMPARISON:  03/14/2016 CT of the neck soft tissues. FINDINGS: Alignment: Normal. Skull base and vertebrae: No acute fracture. No primary bone lesion or focal pathologic process. Soft tissues and  spinal canal: No prevertebral fluid or swelling. No visible canal hematoma. Disc levels: No loss of disc space height. No significant bony foraminal or canal stenosis. Upper chest: Negative. Other: None. IMPRESSION: 1. No acute fracture or dislocation. 2. No soft tissue abnormality identified. 3. No significant bony foraminal or canal stenosis. Electronically Signed   By: Mitzi Hansen M.D.   On: 11/18/2016 02:03    Procedures Procedures (including critical care time)  Medications Ordered in ED Medications  HYDROcodone-acetaminophen (NORCO/VICODIN) 5-325 MG per tablet 1 tablet (1 tablet Oral Given 11/18/16 0059)  ibuprofen (ADVIL,MOTRIN) tablet 600 mg (600 mg Oral Given 11/18/16 0059)  iopamidol (ISOVUE-370) 76 % injection (100 mLs  Contrast Given 11/18/16 0346)     Initial Impression / Assessment and Plan / ED Course  I have reviewed the triage vital signs and the nursing notes.  Pertinent labs & imaging results that were available during my care of the patient were reviewed by me and considered in my medical decision making (see chart for details).  31 year old male presents with neck pain with radiculopathy. He is hypertensive but otherwise vitals are normal. Neuro exam is unremarkable. Shared visit with Dr. Manus Gunning. Will order CT of neck and CTA of head and neck. Pain medications given.  Chem-8 shows mild hypokalemia. Imaging is normal. He  states pain medicine helped a little bit. Will rx pain medicine and muscle relaxer. Advised do not drive or work while taking these meds since he works in [employer]. Will d/c with neurosurgery f/u. Work note given.  Final Clinical Impressions(s) / ED Diagnoses   Final diagnoses:  Neck pain on left side  Radiculopathy, unspecified spinal region    New Prescriptions New Prescriptions   No medications on file     Beryle Quant 11/18/16 Patrina Levering, MD 11/18/16 [phone]

## 2017-03-08 IMAGING — MR MR HEAD WO/W CM
10 of 12 series · 34 of 48 positions shown · IV contrast (multihance)
Comparison: CT head February 13, 2015

CLINICAL DATA: LEFT face and arm numbness, LEFT facial droop.

EXAM:
MRI HEAD WITHOUT AND WITH CONTRAST
TECHNIQUE: Multiplanar, multiecho pulse sequences of the brain and surrounding
structures were obtained without and with intravenous contrast.
CONTRAST:  20mL MULTIHANCE GADOBENATE DIMEGLUMINE 529 MG/ML IV SOLN

[Series 3: DWI · axial · 3.0mm · 1.09mm/px · z∈[-39,+102]mm · 8 of 96 slices shown (1 of 4)]
[im 1/96]
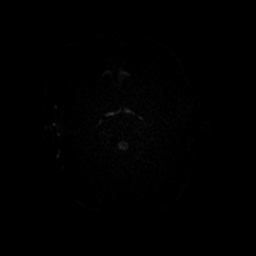
[im 14/96]
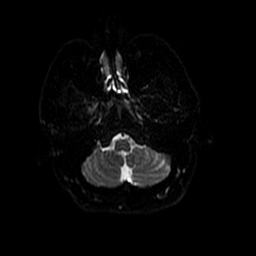
[im 28/96]
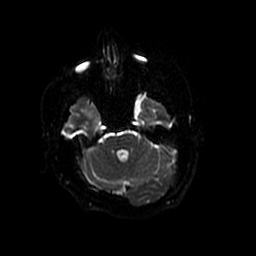
[im 41/96]
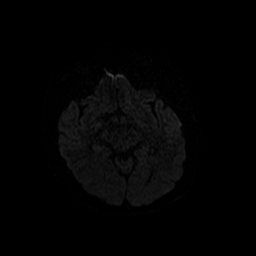
[im 55/96]
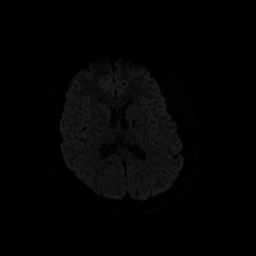
[im 68/96]
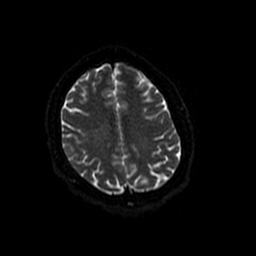
[im 82/96]
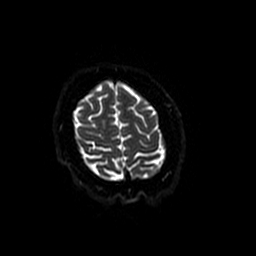
[im 96/96]
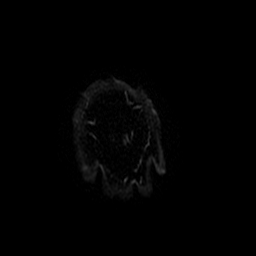

[Series 4: T1 · sagittal · 5.0mm · 0.47mm/px · 2 of 23 slices shown]
[im 1/23]
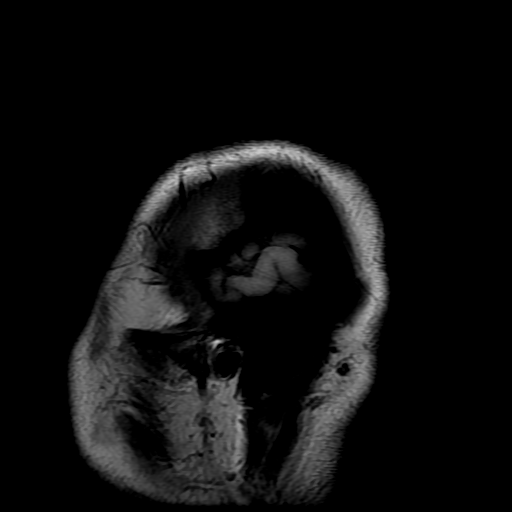
[im 23/23]
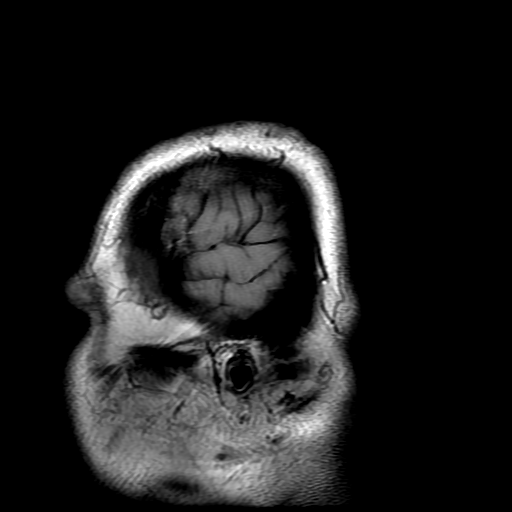

[Series 5: T2 · axial · 5.0mm · 0.43mm/px · z∈[-43,+100]mm · 2 of 25 slices shown (1 of 2)]
[im 1/25]
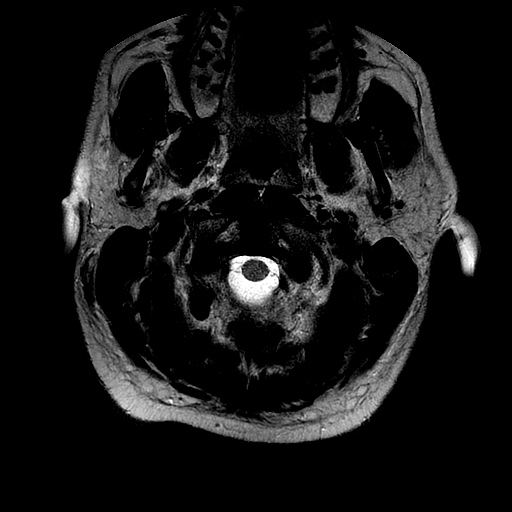
[im 25/25]
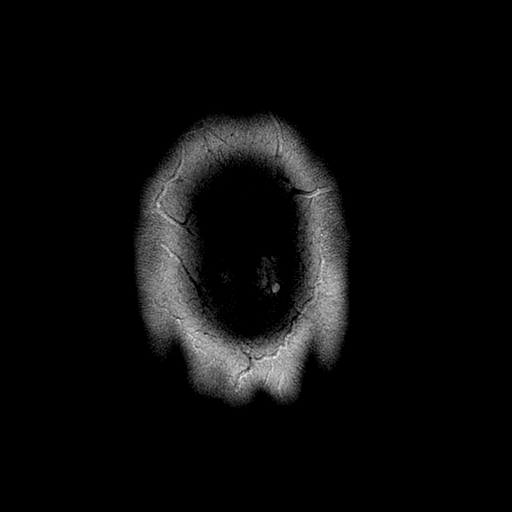

[Series 6: FLAIR · axial · 5.0mm · 0.47mm/px · z∈[-44,+99]mm · 2 of 25 slices shown]
[im 1/25]
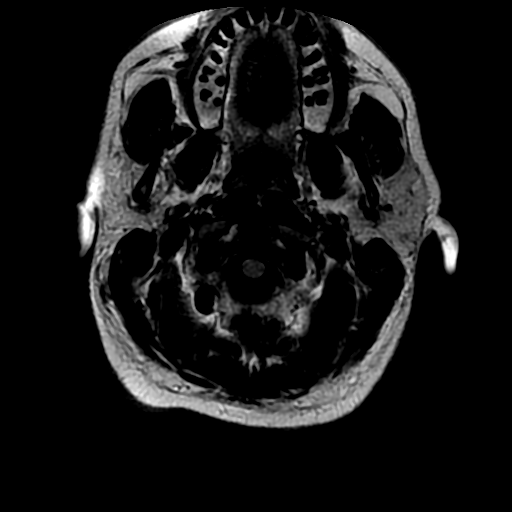
[im 25/25]
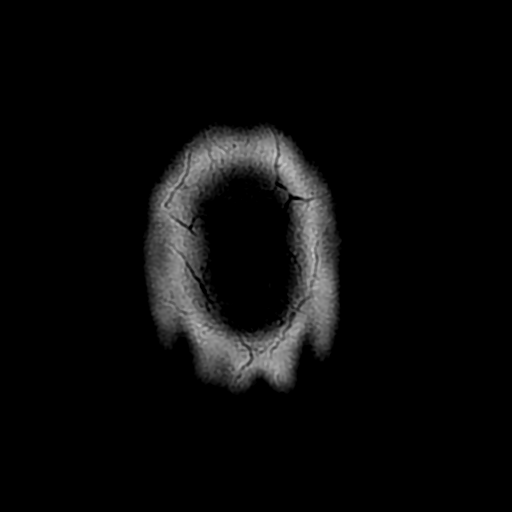

[Series 7: DWI · coronal · 5.0mm · 1.09mm/px · 6 of 66 slices shown (2 of 4)]
[im 1/66]
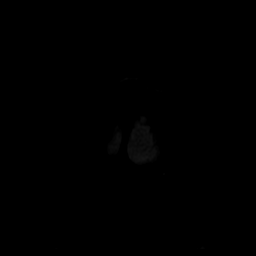
[im 14/66]
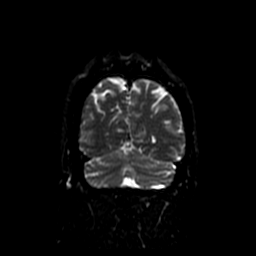
[im 27/66]
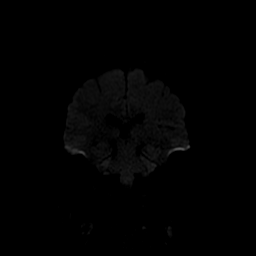
[im 40/66]
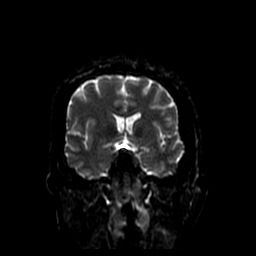
[im 53/66]
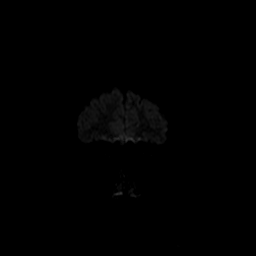
[im 66/66]
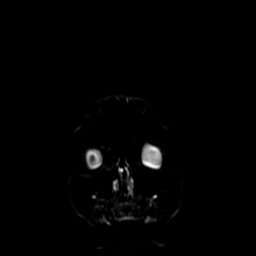

[Series 8: ax mpgr · axial · 5.0mm · 0.47mm/px · 1 of 25 slices shown]
[im 1/25]
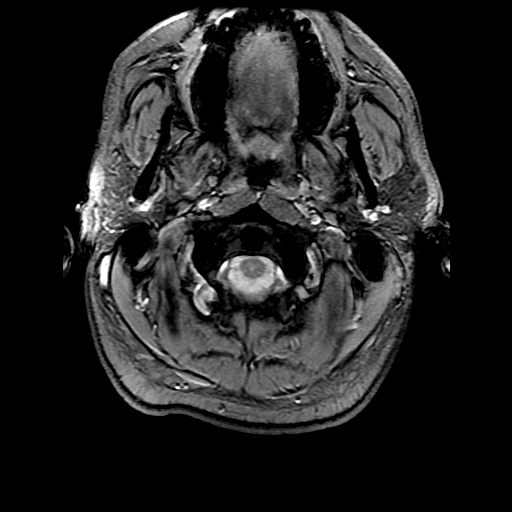

[Series 11: T2 · coronal · 5.0mm · 0.43mm/px · 3 of 30 slices shown (2 of 2)]
[im 1/30]
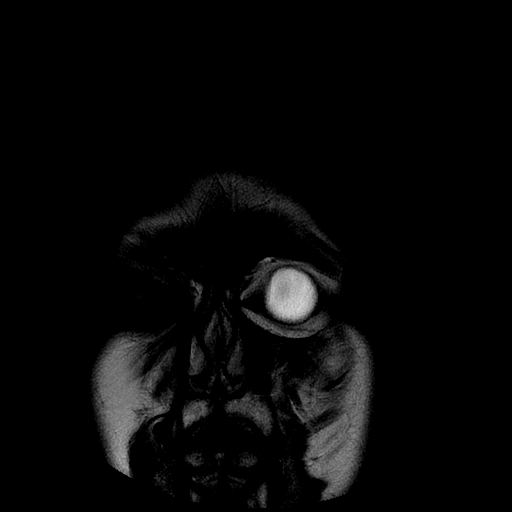
[im 15/30]
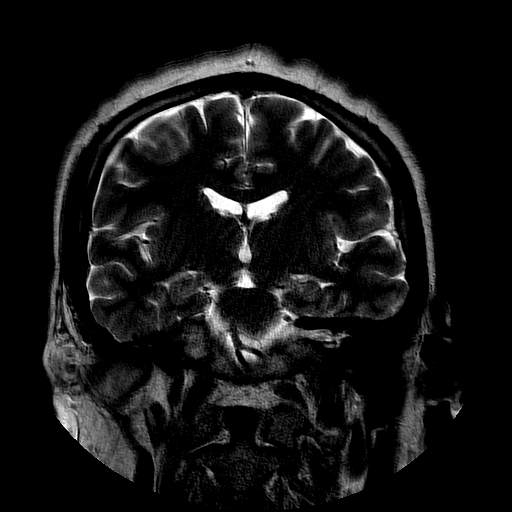
[im 30/30]
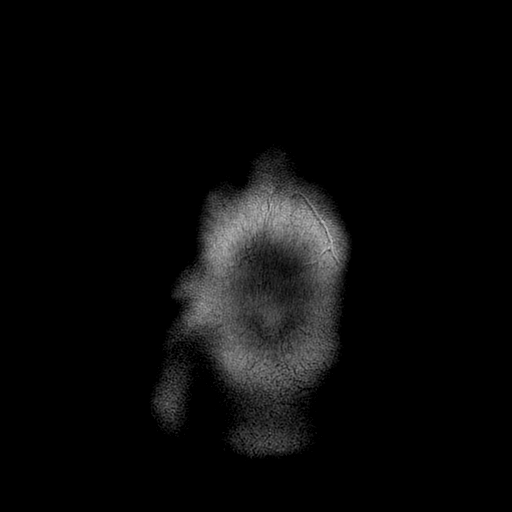

[Series 13: T1 post-contrast · coronal · 5.0mm · 0.43mm/px · 3 of 30 slices shown]
[im 1/30]
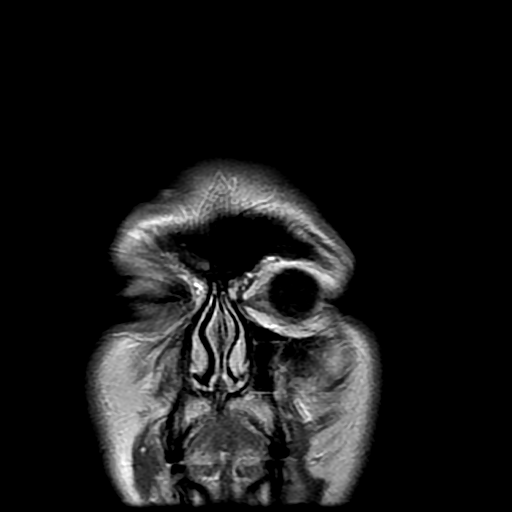
[im 15/30]
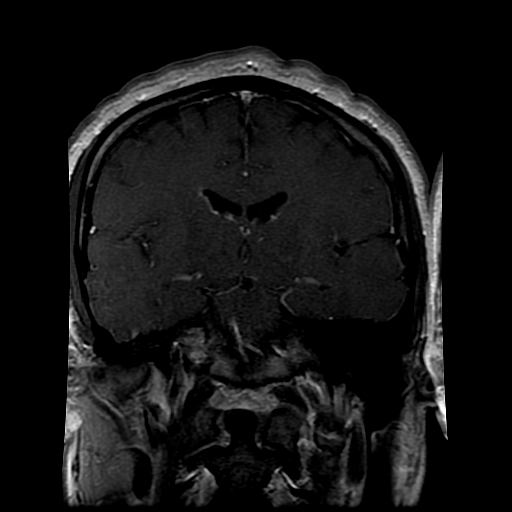
[im 30/30]
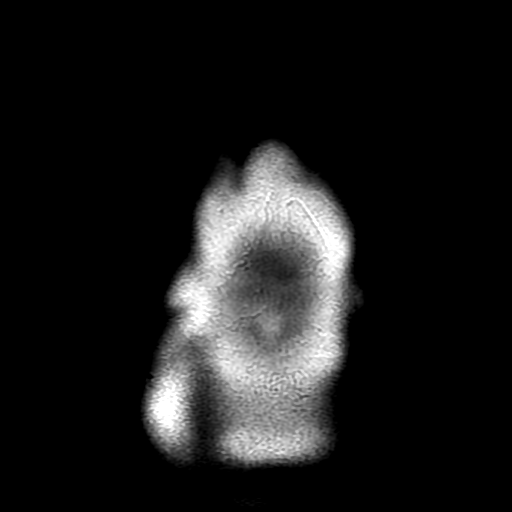

[Series 300: DWI · axial · 3.0mm · 1.09mm/px · z∈[-39,+102]mm · 4 of 48 slices shown (3 of 4)]
[im 1/48]
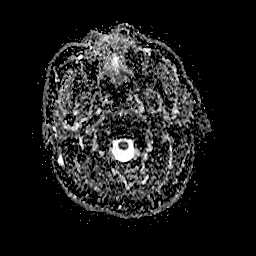
[im 16/48]
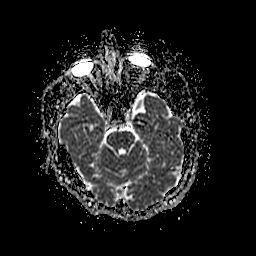
[im 32/48]
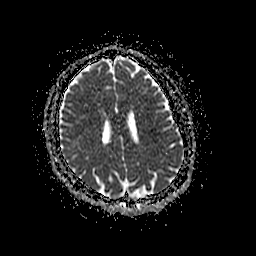
[im 48/48]
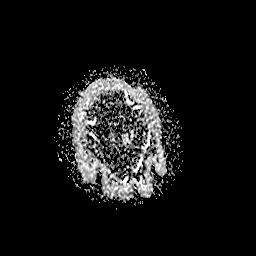

[Series 700: DWI · coronal · 5.0mm · 1.09mm/px · 3 of 33 slices shown (4 of 4)]
[im 1/33]
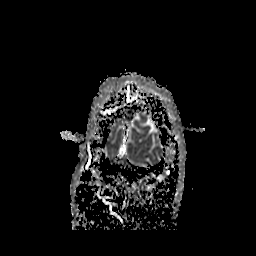
[im 17/33]
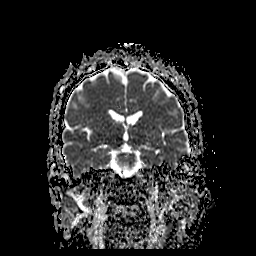
[im 33/33]
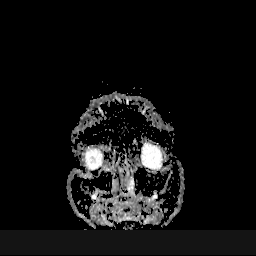

[34 of 48 positions shown; findings below may reference images not displayed]

FINDINGS: The ventricles and sulci are normal for patient's age. No abnormal
parenchymal signal, mass lesions, mass effect. No abnormal
parenchymal enhancement. No reduced diffusion to suggest acute
ischemia. No susceptibility artifact to suggest hemorrhage.

No abnormal extra-axial fluid collections. No extra-axial masses nor
leptomeningeal enhancement. Normal major intracranial vascular flow
voids seen at the skull base.

Ocular globes and orbital contents are unremarkable though not
tailored for evaluation. No suspicious calvarial bone marrow signal.
No abnormal sellar expansion. Craniocervical junction maintained.
Visualized paranasal sinuses and mastoid air cells are well-aerated.
IMPRESSION: Normal MRI of the brain with and without contrast.

## 2017-03-31 IMAGING — CT CT HEAD W/O CM
1 series · 16 of 30 positions shown, 20 images · non-contrast
Comparison: Brain MRI [DATE]/[id number].

CLINICAL DATA: 29-year-old male with left-sided weakness, headache
and nausea. Code stroke.

EXAM:
CT HEAD WITHOUT CONTRAST
TECHNIQUE: Contiguous axial images were obtained from the base of the skull
through the vertex without intravenous contrast.

[Series 2: head 5.0 st · axial · 0.43mm/px · z∈[-117,+18]mm · 16 of 30 slices shown, 20 images]
[im 2/30  brain]
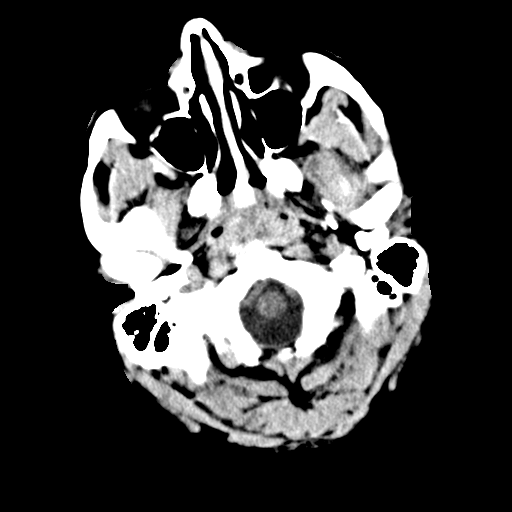
[im 2/30  bone]
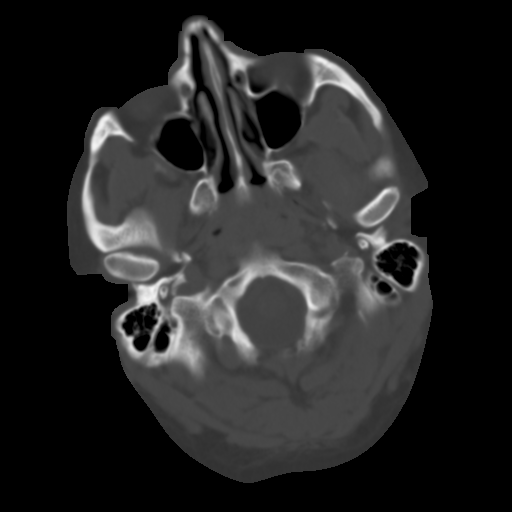
[im 4/30  brain]
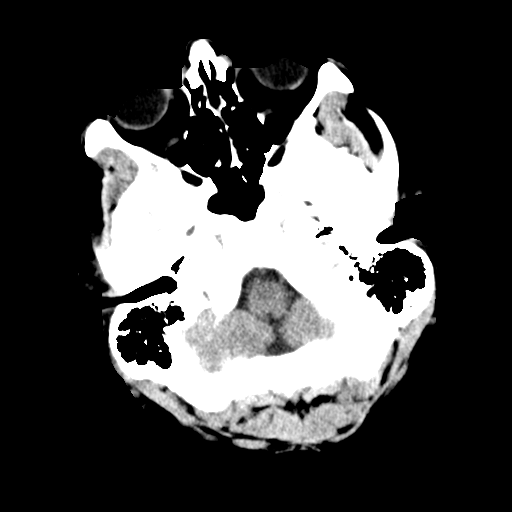
[im 6/30  brain]
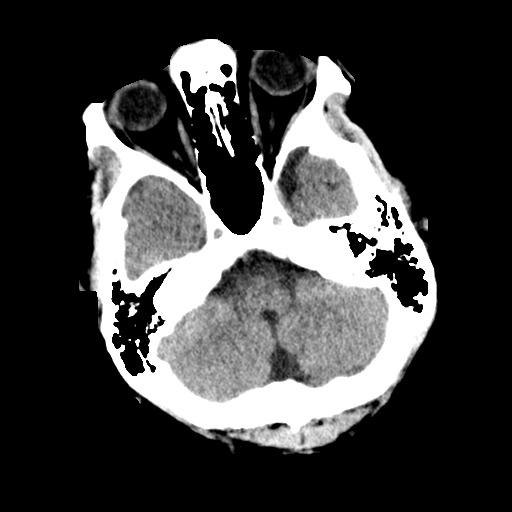
[im 8/30  brain]
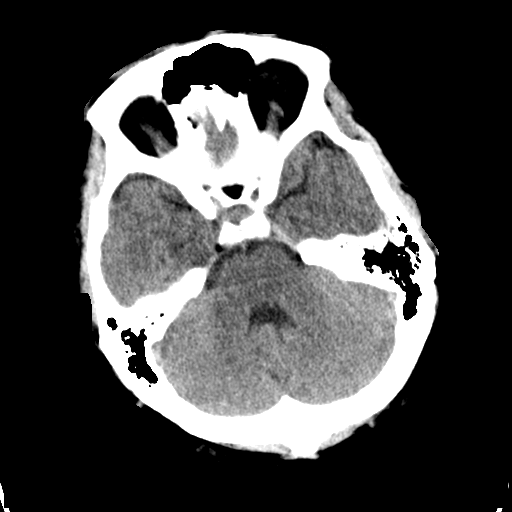
[im 9/30  brain]
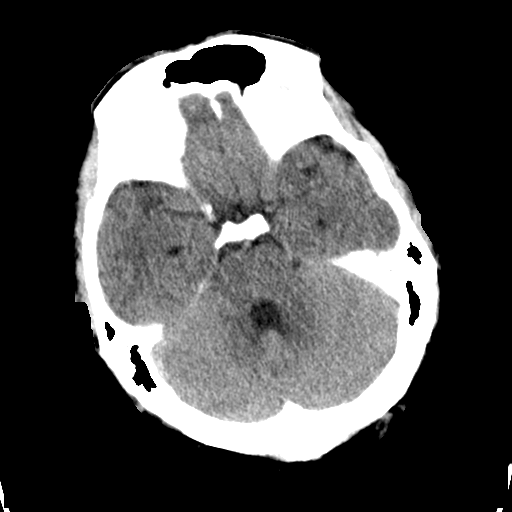
[im 9/30  bone]
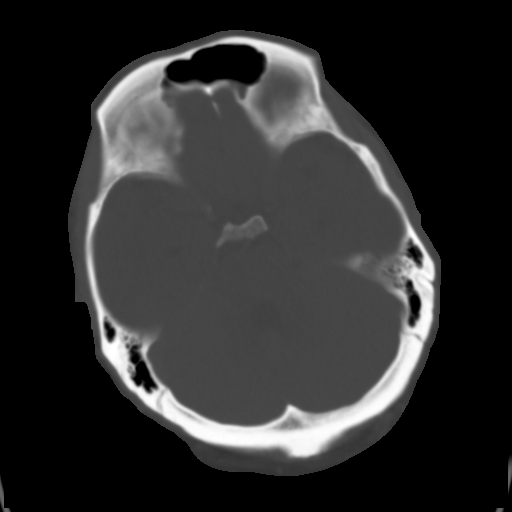
[im 11/30  brain]
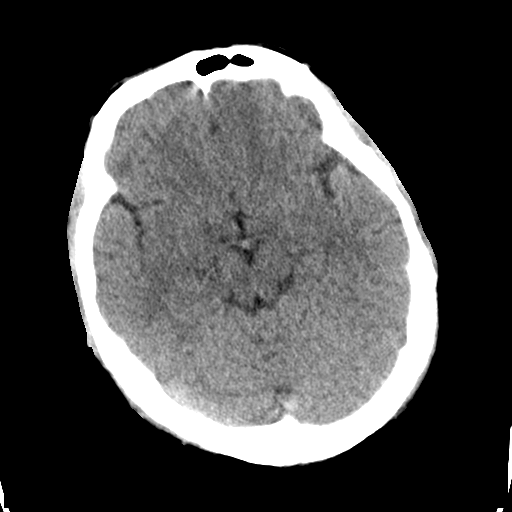
[im 13/30  brain]
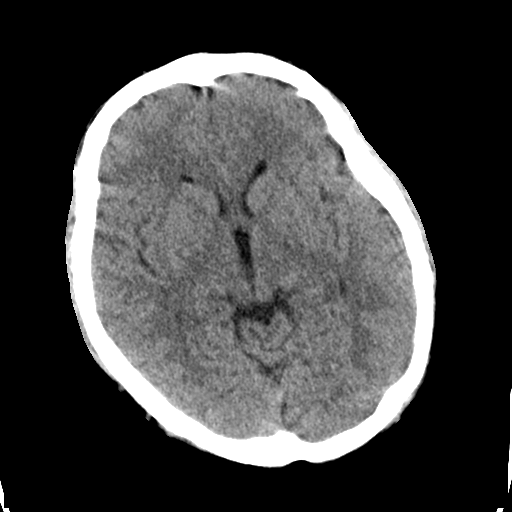
[im 15/30  brain]
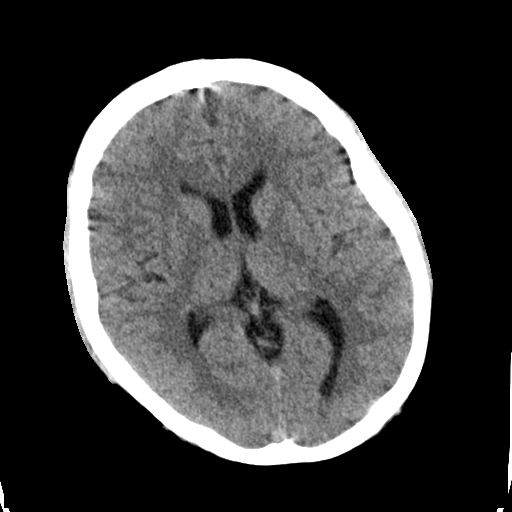
[im 16/30  brain]
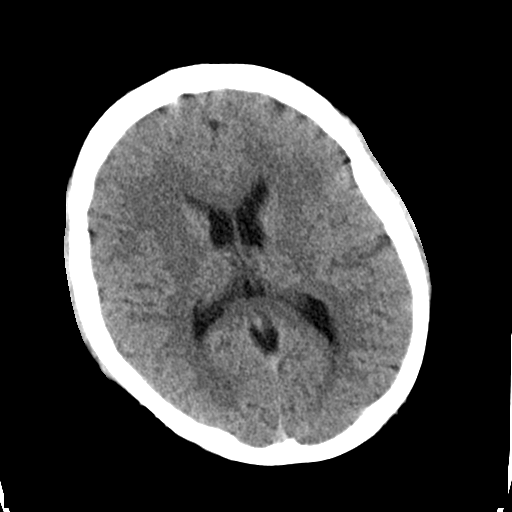
[im 16/30  bone]
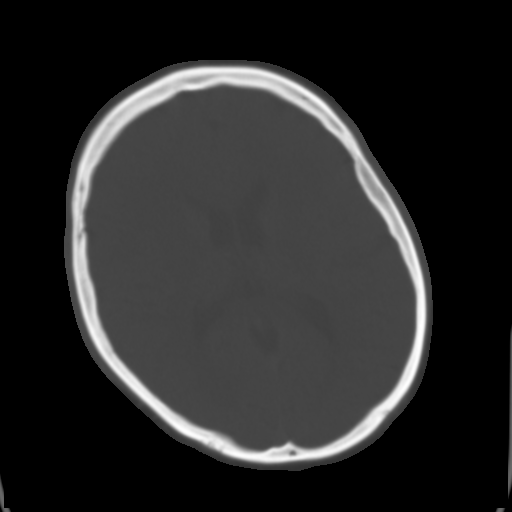
[im 18/30  brain]
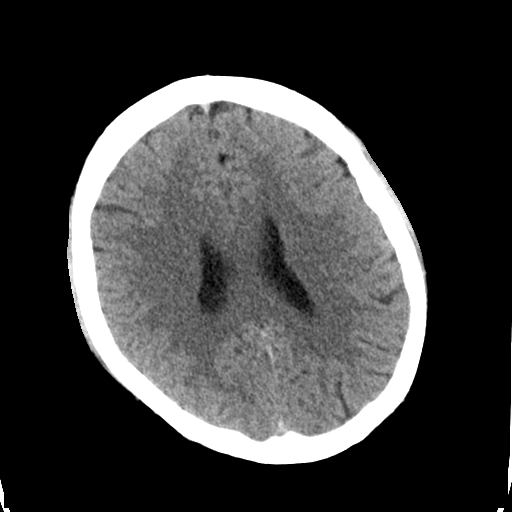
[im 20/30  brain]
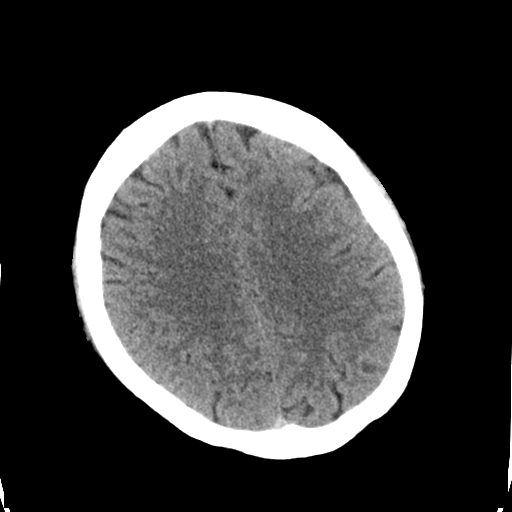
[im 22/30  brain]
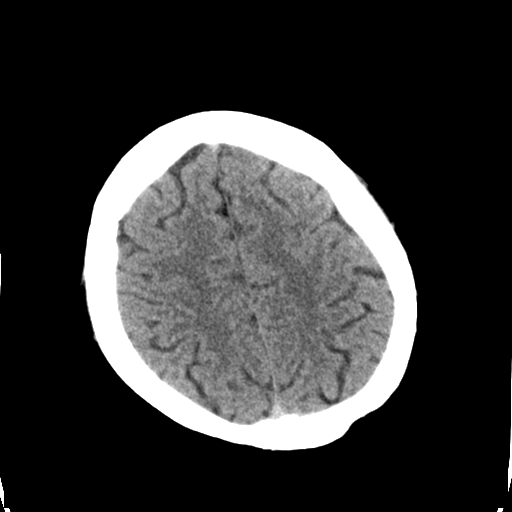
[im 23/30  brain]
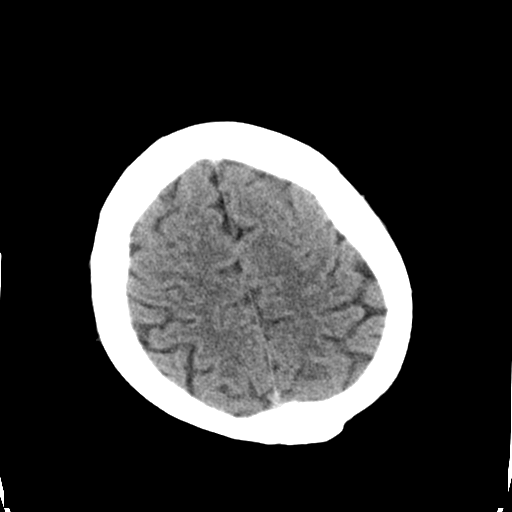
[im 23/30  bone]
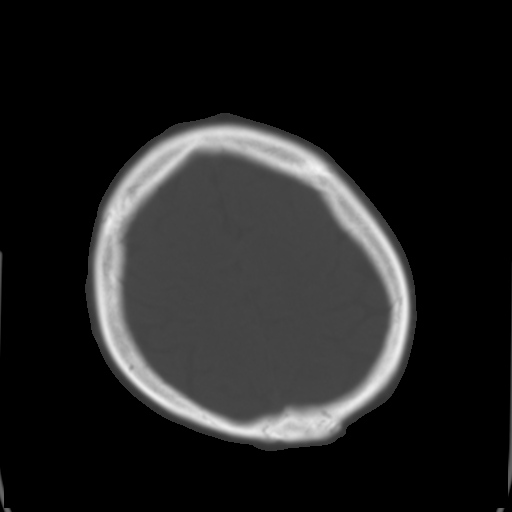
[im 25/30  brain]
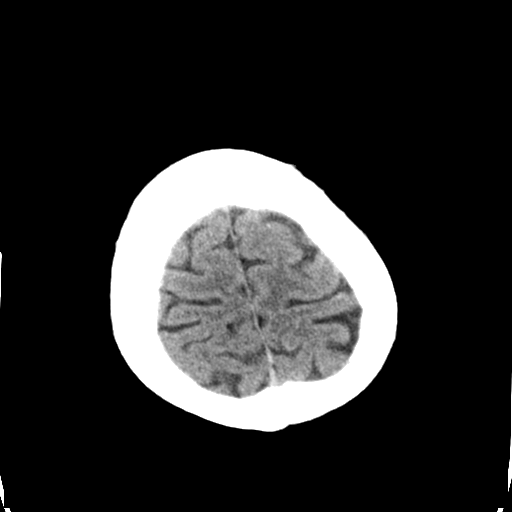
[im 27/30  brain]
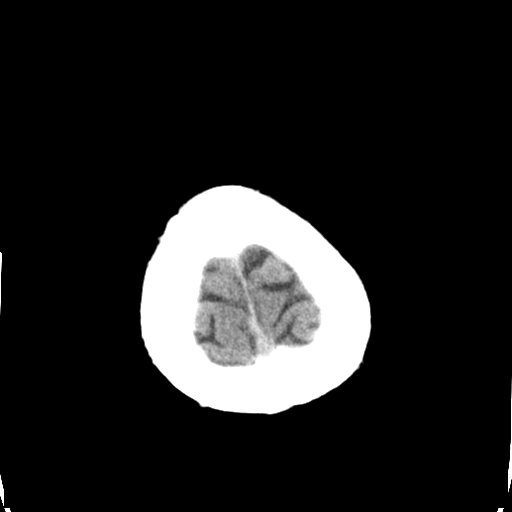
[im 29/30  brain]
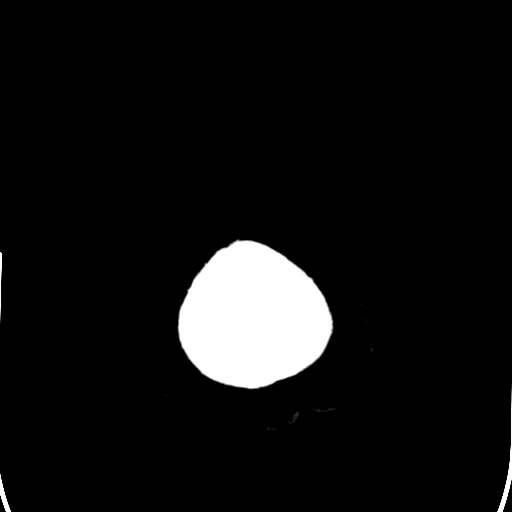

[16 of 30 positions shown; findings below may reference images not displayed]

FINDINGS: No acute intracranial abnormalities. Specifically, no evidence of
acute intracranial hemorrhage, no definite findings of
acute/subacute cerebral ischemia, no mass, mass effect,
hydrocephalus or abnormal intra or extra-axial fluid collections.
Visualized paranasal sinuses and mastoids are well pneumatized. No
acute displaced skull fractures are identified.
IMPRESSION: *No acute intracranial abnormalities.
*The appearance of the brain is normal.
These results were called by telephone at the time of interpretation
on 03/09/2015 at [DATE] to Dr. Norfleet, who verbally acknowledged
these results.

## 2017-04-15 ENCOUNTER — Emergency Department (HOSPITAL_COMMUNITY): Payer: 59

## 2017-04-15 ENCOUNTER — Encounter (HOSPITAL_COMMUNITY): Payer: Self-pay | Admitting: Emergency Medicine

## 2017-04-15 ENCOUNTER — Other Ambulatory Visit: Payer: Self-pay

## 2017-04-15 DIAGNOSIS — R0789 Other chest pain: Secondary | ICD-10-CM | POA: Diagnosis not present

## 2017-04-15 DIAGNOSIS — I10 Essential (primary) hypertension: Secondary | ICD-10-CM | POA: Insufficient documentation

## 2017-04-15 DIAGNOSIS — R079 Chest pain, unspecified: Secondary | ICD-10-CM | POA: Diagnosis present

## 2017-04-15 DIAGNOSIS — F17210 Nicotine dependence, cigarettes, uncomplicated: Secondary | ICD-10-CM | POA: Insufficient documentation

## 2017-04-15 DIAGNOSIS — Z79899 Other long term (current) drug therapy: Secondary | ICD-10-CM | POA: Diagnosis not present

## 2017-04-15 LAB — CBC
HCT: 45.3 % (ref 39.0–52.0)
Hemoglobin: 15.3 g/dL (ref 13.0–17.0)
MCH: 29.1 pg (ref 26.0–34.0)
MCHC: 33.8 g/dL (ref 30.0–36.0)
MCV: 86.3 fL (ref 78.0–100.0)
PLATELETS: 251 10*3/uL (ref 150–400)
RBC: 5.25 MIL/uL (ref 4.22–5.81)
RDW: 13.4 % (ref 11.5–15.5)
WBC: 9 10*3/uL (ref 4.0–10.5)

## 2017-04-15 LAB — BASIC METABOLIC PANEL
Anion gap: 9 (ref 5–15)
BUN: 14 mg/dL (ref 6–20)
CALCIUM: 9.2 mg/dL (ref 8.9–10.3)
CO2: 23 mmol/L (ref 22–32)
CREATININE: 0.96 mg/dL (ref 0.61–1.24)
Chloride: 107 mmol/L (ref 101–111)
GFR calc Af Amer: >60 mL/min (ref >60)
GLUCOSE: 86 mg/dL (ref 65–99)
Potassium: 3.6 mmol/L (ref 3.5–5.1)
Sodium: 139 mmol/L (ref 135–145)

## 2017-04-15 LAB — I-STAT TROPONIN, ED: TROPONIN I, POC: 0 ng/mL (ref 0.00–0.08)

## 2017-04-15 NOTE — ED Triage Notes (Signed)
Pt to ED with c/o left chest pain, neck pain and left arm tingling .onset approx 2 hours ago.  Also c/o slight shortness of breath.  Pt st's pain increases with deep breathing

## 2017-04-16 ENCOUNTER — Emergency Department (HOSPITAL_COMMUNITY)
Admission: EM | Admit: 2017-04-16 | Discharge: 2017-04-16 | Disposition: A | Discharge status: Home / Self Care | Payer: 59 | Attending: Emergency Medicine | Admitting: Emergency Medicine

## 2017-04-16 DIAGNOSIS — R0789 Other chest pain: Secondary | ICD-10-CM

## 2017-04-16 HISTORY — DX: Essential (primary) hypertension: I10

## 2017-04-16 LAB — I-STAT TROPONIN, ED: TROPONIN I, POC: 0 ng/mL (ref 0.00–0.08)

## 2017-04-16 LAB — D-DIMER, QUANTITATIVE (NOT AT ARMC): D DIMER QUANT: 0.34 ug{FEU}/mL (ref 0.00–0.50)

## 2017-04-16 NOTE — ED Provider Notes (Signed)
[facility] EMERGENCY DEPARTMENT Provider Note   CSN: [id number] Arrival date & time: 04/15/17  2203     History   Chief Complaint Chief Complaint  Patient presents with  . Chest Pain  . Tingling  . Neck Pain    HPI Richard Ramirez is a 32 y.o. male.  The history is provided by the patient and medical records.    32 y.o. M with hx of HTN, bell's palsy, acid reflux, presenting to the ED for chest pain.  States he was at work around 8:50 PM when he started to feel some pain in numbness in the left side of his chest.  States it feels like his left arm was asleep.  Paramedics were called and checked his blood pressure and it was very high around 168/111 to start with and slowly trended down en route to the ED.  On arrival here BP 165/100.  States now his chest just feels "sore" on his left side with a little pain into the left neck.  Denies numbness/weakness at this time.  No shortness of breath but does report some pain with deep breathing.  No fever, chills, cough, or URI symptoms.  States he has had symptoms like this in the past, most often occurring when he gets stressed out.  Denies history of anxiety/depression.  No known cardiac hx.  He is not a smoker.  Past Medical History:  Diagnosis Date  . Bell's palsy 01/2015  . Hypertension   . Reflux     Patient Active Problem List   Diagnosis Date Noted  . Tonsillar abscess 03/14/2016  . Strep pharyngitis   . Weakness 02/13/2015  . 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 FINGER SURGERY Right 2002   R small finger  . WRIST SURGERY Left 2010       Home Medications    Prior to Admission medications   Medication Sig Start Date End Date Taking? Authorizing Provider  acetaminophen (TYLENOL) 325 MG tablet Take 2 tablets (650 mg total) by mouth every 6 (six) hours as needed for mild pain, moderate pain, fever or headache. 03/15/16   Almon Hercules, MD  cyclobenzaprine  (FLEXERIL) 10 MG tablet Take 1 tablet (10 mg total) by mouth 2 (two) times daily as needed for muscle spasms. 11/18/16   Bethel Born, PA-C  HYDROcodone-acetaminophen (NORCO/VICODIN) 5-325 MG tablet Take 1-2 tablets by mouth every 6 (six) hours as needed for severe pain. 11/18/16   Bethel Born, PA-C  ibuprofen (ADVIL,MOTRIN) 600 MG tablet Take 1 tablet (600 mg total) by mouth every 8 (eight) hours as needed. Patient taking differently: Take 600 mg by mouth every 8 (eight) hours as needed for moderate pain.  08/14/16   Merrily Brittle, MD    Family History Family History  Problem Relation Age of Onset  . Hyperlipidemia Mother     Social History Social History   Tobacco Use  . Smoking status: Current Some Day Smoker    Types: Cigarettes  . Smokeless tobacco: Never Used  Substance Use Topics  . Alcohol use: Yes    Comment: occasional  . Drug use: No     Allergies   Patient has no known allergies.   Review of Systems Review of Systems  Cardiovascular: Positive for chest pain.  All other systems reviewed and are negative.    Physical Exam Updated Vital Signs BP (!) 165/100 (BP Location: Left Arm)   Pulse 94  Temp 98.2 F (36.8 C) (Oral)   Resp 17   Ht 6\' 2"  (1.88 m)   Wt (!) 141.5 kg (312 lb)   SpO2 100%   BMI 40.06 kg/m   Physical Exam  Constitutional: He is oriented to person, place, and time. He appears well-developed and well-nourished.  Obese, NAD, sleeping  HENT:  Head: Normocephalic and atraumatic.  Mouth/Throat: Oropharynx is clear and moist.  Eyes: Conjunctivae and EOM are normal. Pupils are equal, round, and reactive to light.  Neck: Normal range of motion.  Cardiovascular: Normal rate, regular rhythm and normal heart sounds.  Pulmonary/Chest: Effort normal and breath sounds normal. He has no decreased breath sounds. He has no wheezes. He has no rales.  Abdominal: Soft. Bowel sounds are normal.  Musculoskeletal: Normal range of motion.    Neurological: He is alert and oriented to person, place, and time.  Skin: Skin is warm and dry.  Psychiatric: He has a normal mood and affect.  Nursing note and vitals reviewed.    ED Treatments / Results  Labs (all labs ordered are listed, but only abnormal results are displayed) Labs Reviewed  BASIC METABOLIC PANEL  CBC  D-DIMER, QUANTITATIVE (NOT [facility])  I-STAT TROPONIN, ED  I-STAT TROPONIN, ED    EKG  EKG Interpretation  Date/Time:  Wednesday April 16 2017 01:37:02 EST Ventricular Rate:  67 PR Interval:  160 QRS Duration: 102 QT Interval:  404 QTC Calculation: 426 R Axis:   21 Text Interpretation:  Normal sinus rhythm Inferior infarct , age undetermined Abnormal ECG No significant change was found Confirmed by Glynn Octaveancour, Stephen [phone]([id number]) on 04/16/2017 2:25:23 AM       Radiology Dg Chest 2 View  Result Date: 04/15/2017 CLINICAL DATA:  32 year old male with chest pain. EXAM: CHEST  2 VIEW COMPARISON:  Chest CT dated 08/14/2016 FINDINGS: The lungs are clear. There is no pleural effusion or pneumothorax. The cardiac silhouette is within normal limits. No acute osseous pathology. IMPRESSION: No active cardiopulmonary disease. Electronically Signed   By: Elgie CollardArash  Radparvar M.D.   On: 04/15/2017 22:41    Procedures Procedures (including critical care time)  Medications Ordered in ED Medications - No data to display   Initial Impression / Assessment and Plan / ED Course  I have reviewed the triage vital signs and the nursing notes.  Pertinent labs & imaging results that were available during my care of the patient were reviewed by me and considered in my medical decision making (see chart for details).  32 year old male here with left-sided chest pain.  States his left side felt numb" at work.  This has resolved at this time and now reports some left-sided chest soreness into the left neck.  Reports he has had these symptoms in the past, mostly during periods of  stress.  He was stressed at work today when this occurred again.  Patient is in no acute distress here, actually sleeping when I approached him for exam.  His vitals are stable.  Screening labs obtained from triage are overall reassuring.  Chest x-ray is clear.  Patient does have some tenderness of the left chest wall but no acute deformities.  He denies any falls or injuries.  Patient symptoms are somewhat atypical.  His blood pressure was significantly elevated with EMS but improved by time of arrival here.  Will obtain second troponin, add d-dimer.  Delta troponin is negative.  D-dimer also negative.  Repeat EKG unchanged.  Patient has continued resting comfortably here.  VSS.  Suspicion for ACS, PE, dissection, or other acute cardiac event is low at this time.  Feel he is stable for discharge.  Symptoms may be due to stress/anxiety/etc.  Does not currently have PCP but is insured-- given information to help establish care at local clinics.  He understands to return here for any new/acute changes.  Final Clinical Impressions(s) / ED Diagnoses   Final diagnoses:  Atypical chest pain    ED Discharge Orders    None       Garlon Hatchet, PA-C 04/16/17 0236    Glynn Octave, MD 04/16/17 [phone]

## 2017-04-16 NOTE — Discharge Instructions (Signed)
Cardiac evaluation today looked great. Recommend to establish care with new primary care doctor in the area.  Can use the number on your discharge paperwork to find one locally that accepts your insurance. Return here for any new/acute changes.

## 2017-04-30 ENCOUNTER — Inpatient Hospital Stay: Payer: 59 | Admitting: Internal Medicine

## 2017-08-14 ENCOUNTER — Emergency Department (HOSPITAL_COMMUNITY)
Admission: EM | Admit: 2017-08-14 | Discharge: 2017-08-15 | Disposition: A | Discharge status: Home / Self Care | Payer: 59 | Attending: Emergency Medicine | Admitting: Emergency Medicine

## 2017-08-14 ENCOUNTER — Encounter (HOSPITAL_COMMUNITY): Payer: Self-pay | Admitting: Emergency Medicine

## 2017-08-14 ENCOUNTER — Other Ambulatory Visit: Payer: Self-pay

## 2017-08-14 ENCOUNTER — Emergency Department (HOSPITAL_COMMUNITY): Payer: 59

## 2017-08-14 DIAGNOSIS — F17210 Nicotine dependence, cigarettes, uncomplicated: Secondary | ICD-10-CM | POA: Insufficient documentation

## 2017-08-14 DIAGNOSIS — R079 Chest pain, unspecified: Secondary | ICD-10-CM | POA: Insufficient documentation

## 2017-08-14 DIAGNOSIS — I10 Essential (primary) hypertension: Secondary | ICD-10-CM | POA: Diagnosis not present

## 2017-08-14 LAB — BASIC METABOLIC PANEL
ANION GAP: 10 (ref 5–15)
BUN: 15 mg/dL (ref 6–20)
CHLORIDE: 106 mmol/L (ref 98–111)
CO2: 24 mmol/L (ref 22–32)
Calcium: 9.3 mg/dL (ref 8.9–10.3)
Creatinine, Ser: 1.22 mg/dL (ref 0.61–1.24)
GFR calc non Af Amer: >60 mL/min (ref >60)
Glucose, Bld: 99 mg/dL (ref 70–99)
Potassium: 3.4 mmol/L — ABNORMAL LOW (ref 3.5–5.1)
Sodium: 140 mmol/L (ref 135–145)

## 2017-08-14 LAB — CBC
HCT: 46.3 % (ref 39.0–52.0)
HEMOGLOBIN: 15.3 g/dL (ref 13.0–17.0)
MCH: 28.3 pg (ref 26.0–34.0)
MCHC: 33 g/dL (ref 30.0–36.0)
MCV: 85.6 fL (ref 78.0–100.0)
Platelets: 260 10*3/uL (ref 150–400)
RBC: 5.41 MIL/uL (ref 4.22–5.81)
RDW: 12.3 % (ref 11.5–15.5)
WBC: 10.3 10*3/uL (ref 4.0–10.5)

## 2017-08-14 LAB — I-STAT TROPONIN, ED: Troponin i, poc: 0 ng/mL (ref 0.00–0.08)

## 2017-08-14 MED ORDER — SODIUM CHLORIDE 0.9 % IV BOLUS
1000.0000 mL | Freq: Once | INTRAVENOUS | Status: AC
  Administered 2017-08-14: 1000 mL via INTRAVENOUS

## 2017-08-14 NOTE — ED Notes (Signed)
ED Provider at bedside. 

## 2017-08-14 NOTE — ED Triage Notes (Signed)
Pt reports 9/10 left cp and left arm tingling and numbness that started 2030 tonight. 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 Pt reports N/V x2. Hx HTN.

## 2017-08-14 NOTE — ED Provider Notes (Signed)
[facility] EMERGENCY DEPARTMENT Provider Note   CSN: [id number] Arrival date & time: 08/14/17  2043    History   Chief Complaint Chief Complaint  Patient presents with  . Chest Pain  . Tingling    left arm    HPI Richard Ramirez is a 32 y.o. male.  32 year old male with a history of Bell's palsy and hypertension presents to the emergency department for evaluation of chest pain.  Patient states that he was working in a trailer that was poorly ventilated and very hot when he developed a squeezing chest pain in the center of his chest.  This was associated with left arm tingling and subjective numbness.  Onset of symptoms was at 2030.  Patient developed dizziness as well as nausea.  He had 2 episodes of nonbloody emesis.  Patient states that his pain has remained constant.  He has had similar symptoms in the past for which she has been evaluated in the ED.  He denies any recent fevers or any associated leg swelling, hemoptysis.  No recent surgeries or hospitalizations.  He does not have a primary care doctor.     Past Medical History:  Diagnosis Date  . Bell's palsy 01/2015  . Hypertension   . Reflux     Patient Active Problem List   Diagnosis Date Noted  . Tonsillar abscess 03/14/2016  . Strep pharyngitis   . Weakness 02/13/2015  . 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 FINGER SURGERY Right 2002   R small finger  . WRIST SURGERY Left 2010        Home Medications    Prior to Admission medications   Medication Sig Start Date End Date Taking? Authorizing Provider  acetaminophen (TYLENOL) 325 MG tablet Take 2 tablets (650 mg total) by mouth every 6 (six) hours as needed for mild pain, moderate pain, fever or headache. Patient not taking: Reported on 08/14/2017 03/15/16   Almon Hercules, MD  cyclobenzaprine (FLEXERIL) 10 MG tablet Take 1 tablet (10 mg total) by mouth 2 (two) times daily as needed for muscle  spasms. Patient not taking: Reported on 08/14/2017 11/18/16   Bethel Born, PA-C  HYDROcodone-acetaminophen (NORCO/VICODIN) 5-325 MG tablet Take 1-2 tablets by mouth every 6 (six) hours as needed for severe pain. Patient not taking: Reported on 08/14/2017 11/18/16   Bethel Born, PA-C  ibuprofen (ADVIL,MOTRIN) 600 MG tablet Take 1 tablet (600 mg total) by mouth every 8 (eight) hours as needed. Patient not taking: Reported on 08/14/2017 08/14/16   Merrily Brittle, MD    Family History Family History  Problem Relation Age of Onset  . Hyperlipidemia Mother     Social History Social History   Tobacco Use  . Smoking status: Current Some Day Smoker    Types: Cigarettes  . Smokeless tobacco: Never Used  Substance Use Topics  . Alcohol use: Yes    Comment: occasional  . Drug use: No     Allergies   Patient has no known allergies.   Review of Systems Review of Systems Ten systems reviewed and are negative for acute change, except as noted in the HPI.    Physical Exam Updated Vital Signs BP 121/78   Pulse (!) 57   Temp 99.4 F (37.4 C) (Oral)   Resp 13   Ht 6\' 3"  (1.905 m)   Wt (!) 136.5 kg (301 lb)   SpO2 97%   BMI 37.62 kg/m  Physical Exam  Constitutional: He is oriented to person, place, and time. He appears well-developed and well-nourished. No distress.  Nontoxic appearing.  HENT:  Head: Normocephalic and atraumatic.  Eyes: Conjunctivae and EOM are normal. No scleral icterus.  Neck: Normal range of motion.  Cardiovascular: Normal rate, regular rhythm and intact distal pulses.  Pulmonary/Chest: Effort normal. No stridor. No respiratory distress. He has no wheezes. He has no rales.  Lungs CTAB  Abdominal: Soft. He exhibits no distension and no mass. There is no tenderness. There is no guarding.  Soft, obese, nontender  Musculoskeletal: Normal range of motion.  Neurological: He is alert and oriented to person, place, and time. He exhibits normal muscle  tone. Coordination normal.  Skin: Skin is warm and dry. No rash noted. He is not diaphoretic. No erythema. No pallor.  Psychiatric: He has a normal mood and affect. His behavior is normal.  Nursing note and vitals reviewed.    ED Treatments / Results  Labs (all labs ordered are listed, but only abnormal results are displayed) Labs Reviewed  BASIC METABOLIC PANEL - Abnormal; Notable for the following components:      Result Value   Potassium 3.4 (*)    All other components within normal limits  CBC  I-STAT TROPONIN, ED    EKG EKG Interpretation  Date/Time:  Thursday August 14 2017 20:57:56 EDT Ventricular Rate:  87 PR Interval:  158 QRS Duration: 92 QT Interval:  364 QTC Calculation: 438 R Axis:   -12 Text Interpretation:  Normal sinus rhythm Normal ECG Confirmed by Rolland PorterJames, Mark ([id number]) on 08/14/2017 9:27:37 PM   Radiology Dg Chest 2 View  Result Date: 08/14/2017 CLINICAL DATA:  Left chest pain and left arm numbness and tingling beginning tonight. EXAM: CHEST - 2 VIEW COMPARISON:  04/15/2017 FINDINGS: Lungs are adequately inflated without consolidation or effusion. Cardiomediastinal silhouette and remainder the exam is unchanged. IMPRESSION: No active cardiopulmonary disease. Electronically Signed   By: Elberta Fortisaniel  Boyle M.D.   On: 08/14/2017 21:28    Procedures Procedures (including critical care time)  Medications Ordered in ED Medications  sodium chloride 0.9 % bolus 1,000 mL (0 mLs Intravenous Stopped 08/15/17 0010)     Initial Impression / Assessment and Plan / ED Course  I have reviewed the triage vital signs and the nursing notes.  Pertinent labs & imaging results that were available during my care of the patient were reviewed by me and considered in my medical decision making (see chart for details).     Patient presents to the emergency department for evaluation of chest pain.  Low suspicion for emergent cardiac etiology given reassuring workup today.  EKG is  nonischemic and troponin negative.  Patient has a heart score of 2 consistent with low risk of acute coronary event.  Chest x-ray without evidence of mediastinal widening to suggest dissection.  No pneumothorax, pneumonia, pleural effusion.  Pulmonary embolus further considered; however, patient without tachycardia, tachypnea, dyspnea, hypoxia.  Patient is PERC negative.  On chart review, patient has presented to the ED previously for similar complaints.  He has neglected to follow-up with primary care as well as cardiology.  I have stressed the importance of outpatient follow-up.  Discussed possibility of stress test completion with cardiology.  I do not believe further emergent work-up is indicated at this time.  Return precautions discussed and provided. Patient discharged in stable condition with no unaddressed concerns.  Vitals:   08/14/17 2330 08/14/17 2345 08/15/17 0000 08/15/17 0015  BP: 120/84  123/84 130/81 121/78  Pulse: 60 61 (!) 57 (!) 57  Resp: 13 15 13    Temp:      TempSrc:      SpO2: 98% 97% 97% 97%  Weight:      Height:        Final Clinical Impressions(s) / ED Diagnoses   Final diagnoses:  Chest pain, unspecified type    ED Discharge Orders    None       Antony Madura, PA-C 08/15/17 0960    Rolland Porter, MD 08/25/17 1259

## 2017-08-15 NOTE — Discharge Instructions (Addendum)
Your work-up in the ED was reassuring and did not reveal a concerning cause of your chest pain.  Follow up with a cardiologist for further evaluation of your ongoing symptoms.

## 2017-10-25 ENCOUNTER — Emergency Department (HOSPITAL_COMMUNITY)
Admission: EM | Admit: 2017-10-25 | Discharge: 2017-10-26 | Disposition: A | Discharge status: Home / Self Care | Payer: 59 | Attending: Emergency Medicine | Admitting: Emergency Medicine

## 2017-10-25 ENCOUNTER — Encounter (HOSPITAL_COMMUNITY): Payer: Self-pay | Admitting: Emergency Medicine

## 2017-10-25 DIAGNOSIS — R42 Dizziness and giddiness: Secondary | ICD-10-CM

## 2017-10-25 DIAGNOSIS — I10 Essential (primary) hypertension: Secondary | ICD-10-CM | POA: Insufficient documentation

## 2017-10-25 DIAGNOSIS — F17210 Nicotine dependence, cigarettes, uncomplicated: Secondary | ICD-10-CM | POA: Diagnosis not present

## 2017-10-25 DIAGNOSIS — R519 Headache, unspecified: Secondary | ICD-10-CM

## 2017-10-25 DIAGNOSIS — R51 Headache: Secondary | ICD-10-CM | POA: Diagnosis not present

## 2017-10-25 NOTE — ED Triage Notes (Signed)
Reports not feeling well at work tonight.  C/o headache and left eye twitching.  Reports BP elevated at work SBP 160's.  No history of htn.

## 2017-10-26 MED ORDER — KETOROLAC TROMETHAMINE 30 MG/ML IJ SOLN
30.0000 mg | Freq: Once | INTRAMUSCULAR | Status: AC
  Administered 2017-10-26: 30 mg via INTRAMUSCULAR
  Filled 2017-10-26: qty 1

## 2017-10-26 NOTE — Discharge Instructions (Addendum)
He was seen today for headache and dizziness.  Your blood pressure much improved.  Make sure that you are getting adequate rest and hydration.  Take ibuprofen as needed for any pain.

## 2017-10-26 NOTE — ED Provider Notes (Signed)
[facility] EMERGENCY DEPARTMENT Provider Note   CSN: [id number] Arrival date & time: 10/25/17  2213     History   Chief Complaint Chief Complaint  Patient presents with  . Hypertension    HPI Richard Ramirez is a 32 y.o. male.  HPI  This is a 32 year old male who presents with dizziness and headache.  Patient reports that he was at work earlier this evening.  He reports being very "stressed out."  He states he began to feel dizzy and flushed.  They took his blood pressure and his systolic blood pressure was 160.  Denies any history of blood pressure issues.  He states that when he does get anxious his blood pressure increases.  He has worked the last 7 days in a row.  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  Reports that he has been staying hydrated.  No recent illnesses.  He states that he has had a headache all day he reports a left-sided temporal headache.  No vision changes, nausea, vomiting.  Reports a history of headache with similar symptoms.  Currently rates his headache at 5 out of 10.  He has not taken anything today for his symptoms.  Denies any weakness, numbness, strokelike symptoms.  Denies chest pain or shortness of breath.  Past Medical History:  Diagnosis Date  . Bell's palsy 01/2015  . Hypertension   . Reflux     Patient Active Problem List   Diagnosis Date Noted  . Tonsillar abscess 03/14/2016  . Strep pharyngitis   . Weakness 02/13/2015  . 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 FINGER SURGERY Right 2002   R small finger  . WRIST SURGERY Left 2010        Home Medications    Prior to Admission medications   Medication Sig Start Date End Date Taking? Authorizing Provider  acetaminophen (TYLENOL) 325 MG tablet Take 2 tablets (650 mg total) by mouth every 6 (six) hours as needed for mild pain, moderate pain, fever or headache. Patient not taking: Reported on 08/14/2017  03/15/16   Almon Hercules, MD  cyclobenzaprine (FLEXERIL) 10 MG tablet Take 1 tablet (10 mg total) by mouth 2 (two) times daily as needed for muscle spasms. Patient not taking: Reported on 08/14/2017 11/18/16   Bethel Born, PA-C  HYDROcodone-acetaminophen (NORCO/VICODIN) 5-325 MG tablet Take 1-2 tablets by mouth every 6 (six) hours as needed for severe pain. Patient not taking: Reported on 08/14/2017 11/18/16   Bethel Born, PA-C  ibuprofen (ADVIL,MOTRIN) 600 MG tablet Take 1 tablet (600 mg total) by mouth every 8 (eight) hours as needed. Patient not taking: Reported on 08/14/2017 08/14/16   Merrily Brittle, MD    Family History Family History  Problem Relation Age of Onset  . Hyperlipidemia Mother     Social History Social History   Tobacco Use  . Smoking status: Current Some Day Smoker    Types: Cigarettes  . Smokeless tobacco: Never Used  Substance Use Topics  . Alcohol use: Yes    Comment: occasional  . Drug use: No     Allergies   Patient has no known allergies.   Review of Systems Review of Systems  Constitutional: Negative for fever.  Respiratory: Negative for shortness of breath.   Cardiovascular: Negative for chest pain.  Gastrointestinal: Negative for abdominal pain, nausea and vomiting.  Musculoskeletal: Negative for neck pain and neck stiffness.  Neurological: Positive for dizziness and headaches. Negative for weakness and numbness.  All other systems reviewed and are negative.    Physical Exam Updated Vital Signs BP (!) 142/95 (BP Location: Right Arm)   Pulse 85   Temp 99 F (37.2 C) (Oral)   Resp 14   Ht 1.905 m (6\' 3" )   Wt (!) 142.9 kg   SpO2 98%   BMI 39.37 kg/m   Physical Exam  Constitutional: He is oriented to person, place, and time. He appears well-developed and well-nourished.  Overweight, nontoxic-appearing  HENT:  Head: Normocephalic and atraumatic.  Eyes: Pupils are equal, round, and reactive to light.  Neck: Normal range  of motion. Neck supple.  Cardiovascular: Normal rate, regular rhythm and normal heart sounds.  No murmur heard. Pulmonary/Chest: Effort normal and breath sounds normal. No respiratory distress. He has no wheezes.  Abdominal: Soft. Bowel sounds are normal. There is no tenderness. There is no rebound.  Musculoskeletal: He exhibits no edema.  Neurological: He is alert and oriented to person, place, and time.  Fluent speech, cranial nerves II through XII intact, 5 out of 5 strength in all 4 extremities  Skin: Skin is warm and dry.  Psychiatric: He has a normal mood and affect.  Nursing note and vitals reviewed.    ED Treatments / Results  Labs (all labs ordered are listed, but only abnormal results are displayed) Labs Reviewed - No data to display  EKG EKG Interpretation  Date/Time:  Sunday October 26 2017 01:15:35 EDT Ventricular Rate:  69 PR Interval:    QRS Duration: 96 QT Interval:  388 QTC Calculation: 416 R Axis:   -8 Text Interpretation:  Sinus rhythm ST elev, probable normal early repol pattern Confirmed by Ross Marcus ([id number]) on 10/26/2017 2:35:03 AM   Radiology No results found.  Procedures Procedures (including critical care time)  Medications Ordered in ED Medications  ketorolac (TORADOL) 30 MG/ML injection 30 mg (30 mg Intramuscular Given 10/26/17 0106)     Initial Impression / Assessment and Plan / ED Course  I have reviewed the triage vital signs and the nursing notes.  Pertinent labs & imaging results that were available during my care of the patient were reviewed by me and considered in my medical decision making (see chart for details).  Clinical Course as of Oct 27 235  Wynelle Link Oct 26, 2017  2595 Patient is somewhat improved with Toradol.  EKG is largely reassuring.  Blood pressure continues to be reassuring.  Comes likely multifactorial.  He reports increased stress.  Recommend rest and adequate hydration.   [CH]    Clinical Course User  Index [CH] Cap Massi, Mayer Masker, MD    Patient presents with episode of dizziness and headache.  He is overall nontoxic-appearing on exam and vital signs are reassuring.  Neurologic exam is reassuring as well.  Reports increasing stress and multiple continuous days at work.    He appears hydrated.  He is not orthostatic.  EKG shows no evidence of arrhythmia.  Patient was given Toradol for headache and had some improvement of his pain.  Low suspicion for acute emergent process at this time.  Doubt subarachnoid or meningitis given no additional concerning symptoms or red flags.  Recommend rest and hydration.  After history, exam, and medical workup I feel the patient has been appropriately medically screened and is safe for discharge home. Pertinent diagnoses were discussed with the patient. Patient was given return precautions.   Final Clinical Impressions(s) / ED Diagnoses  Final diagnoses:  Dizziness  Acute nonintractable headache, unspecified headache type    ED Discharge Orders    None       Shon Baton, MD 10/26/17 [phone]

## 2017-11-28 ENCOUNTER — Emergency Department (HOSPITAL_COMMUNITY)
Admission: EM | Admit: 2017-11-28 | Discharge: 2017-11-29 | Disposition: A | Discharge status: Home / Self Care | Payer: 59 | Attending: Emergency Medicine | Admitting: Emergency Medicine

## 2017-11-28 ENCOUNTER — Other Ambulatory Visit: Payer: Self-pay

## 2017-11-28 DIAGNOSIS — R51 Headache: Secondary | ICD-10-CM | POA: Insufficient documentation

## 2017-11-28 DIAGNOSIS — R200 Anesthesia of skin: Secondary | ICD-10-CM | POA: Diagnosis not present

## 2017-11-28 DIAGNOSIS — F17210 Nicotine dependence, cigarettes, uncomplicated: Secondary | ICD-10-CM | POA: Diagnosis not present

## 2017-11-28 DIAGNOSIS — I10 Essential (primary) hypertension: Secondary | ICD-10-CM | POA: Diagnosis not present

## 2017-11-28 DIAGNOSIS — R202 Paresthesia of skin: Secondary | ICD-10-CM | POA: Insufficient documentation

## 2017-11-28 DIAGNOSIS — R519 Headache, unspecified: Secondary | ICD-10-CM

## 2017-11-29 ENCOUNTER — Other Ambulatory Visit: Payer: Self-pay

## 2017-11-29 ENCOUNTER — Encounter (HOSPITAL_COMMUNITY): Payer: Self-pay | Admitting: Emergency Medicine

## 2017-11-29 ENCOUNTER — Emergency Department (HOSPITAL_COMMUNITY): Payer: 59

## 2017-11-29 LAB — BASIC METABOLIC PANEL
Anion gap: 9 (ref 5–15)
BUN: 13 mg/dL (ref 6–20)
CO2: 23 mmol/L (ref 22–32)
CREATININE: 1 mg/dL (ref 0.61–1.24)
Calcium: 9.5 mg/dL (ref 8.9–10.3)
Chloride: 108 mmol/L (ref 98–111)
GFR calc Af Amer: >60 mL/min (ref >60)
Glucose, Bld: 92 mg/dL (ref 70–99)
Potassium: 3.6 mmol/L (ref 3.5–5.1)
SODIUM: 140 mmol/L (ref 135–145)

## 2017-11-29 LAB — CBC
HEMATOCRIT: 45.5 % (ref 39.0–52.0)
Hemoglobin: 15 g/dL (ref 13.0–17.0)
MCH: 28.4 pg (ref 26.0–34.0)
MCHC: 33 g/dL (ref 30.0–36.0)
MCV: 86 fL (ref 80.0–100.0)
Platelets: 268 10*3/uL (ref 150–400)
RBC: 5.29 MIL/uL (ref 4.22–5.81)
RDW: 12.4 % (ref 11.5–15.5)
WBC: 9.8 10*3/uL (ref 4.0–10.5)
nRBC: 0 % (ref 0.0–0.2)

## 2017-11-29 LAB — URINALYSIS, ROUTINE W REFLEX MICROSCOPIC
BILIRUBIN URINE: NEGATIVE
Glucose, UA: NEGATIVE mg/dL
Hgb urine dipstick: NEGATIVE
KETONES UR: NEGATIVE mg/dL
Leukocytes, UA: NEGATIVE
NITRITE: NEGATIVE
PH: 6 (ref 5.0–8.0)
Protein, ur: NEGATIVE mg/dL
Specific Gravity, Urine: 1.026 (ref 1.005–1.030)

## 2017-11-29 MED ORDER — LISINOPRIL 5 MG PO TABS: 5.0000 mg | ORAL_TABLET | Freq: Every day | ORAL | 6 refills | Status: AC

## 2017-11-29 MED ORDER — DEXAMETHASONE SODIUM PHOSPHATE 10 MG/ML IJ SOLN
10.0000 mg | Freq: Once | INTRAMUSCULAR | Status: AC
  Administered 2017-11-29: 10 mg via INTRAVENOUS
  Filled 2017-11-29: qty 1

## 2017-11-29 MED ORDER — KETOROLAC TROMETHAMINE 30 MG/ML IJ SOLN
30.0000 mg | Freq: Once | INTRAMUSCULAR | Status: AC
  Administered 2017-11-29: 30 mg via INTRAVENOUS
  Filled 2017-11-29: qty 1

## 2017-11-29 MED ORDER — DIPHENHYDRAMINE HCL 50 MG/ML IJ SOLN
25.0000 mg | Freq: Once | INTRAMUSCULAR | Status: AC
  Administered 2017-11-29: 25 mg via INTRAVENOUS
  Filled 2017-11-29: qty 1

## 2017-11-29 MED ORDER — PROCHLORPERAZINE EDISYLATE 10 MG/2ML IJ SOLN
10.0000 mg | Freq: Once | INTRAMUSCULAR | Status: AC
  Administered 2017-11-29: 10 mg via INTRAVENOUS
  Filled 2017-11-29: qty 2

## 2017-11-29 MED ORDER — SODIUM CHLORIDE 0.9 % IV BOLUS
1000.0000 mL | Freq: Once | INTRAVENOUS | Status: AC
  Administered 2017-11-29: 1000 mL via INTRAVENOUS

## 2017-11-29 NOTE — Discharge Instructions (Signed)
Schedule follow-up with 1 of the listed neurology groups to have further evaluation for your frequent headaches.  Schedule follow-up with a primary provider to have your blood pressure rechecked.

## 2017-11-29 NOTE — ED Triage Notes (Addendum)
C/o sharp pain and numbness to L side of head that started around 10pm.  States he was at work and "dozed off" and put his head down and employees there came to check his BP.  States BP was "163/152".  States he has headaches daily. Hasn't taken BP medication in 3 months because he was out of refills and needs to find another MD.  Reports intermittent tightness to L jaw.  Reports SOB.  No neuro deficits noted on triage exam.

## 2017-11-29 NOTE — ED Provider Notes (Signed)
[facility] EMERGENCY DEPARTMENT Provider Note   CSN: [id number] Arrival date & time: 11/28/17  2344     History   Chief Complaint Chief Complaint  Patient presents with  . Headache  . Jaw Pain  . syncope    HPI Richard Ramirez is a 32 y.o. male.  Patient presents to the emergency department for evaluation of headache.  Patient reports that he has been having daily headaches for months.  He does have a history of hypertension, does not currently have a doctor and has been out of his blood pressure medications for some time.  Patient reports that he developed a headache once again tonight at work.  They checked his blood pressure and it was 160s over 120s.  Patient reports that he has frequently been experiencing numbness and tingling on the left side of his body.  This seems to occur with the headaches.  He has been taking Aleve for his headaches and it does relieve the headache for some time, but then it comes back.  Often when he gets the headache he gets a sharp pain at the left TMJ area.  He has never been evaluated for these headaches.     Past Medical History:  Diagnosis Date  . Bell's palsy 01/2015  . Hypertension   . Reflux     Patient Active Problem List   Diagnosis Date Noted  . Tonsillar abscess 03/14/2016  . Strep pharyngitis   . Weakness 02/13/2015  . 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 FINGER SURGERY Right 2002   R small finger  . WRIST SURGERY Left 2010        Home Medications    Prior to Admission medications   Medication Sig Start Date End Date Taking? Authorizing Provider  acetaminophen (TYLENOL) 325 MG tablet Take 2 tablets (650 mg total) by mouth every 6 (six) hours as needed for mild pain, moderate pain, fever or headache. Patient not taking: Reported on 08/14/2017 03/15/16   Almon Hercules, MD  cyclobenzaprine (FLEXERIL) 10 MG tablet Take 1 tablet (10 mg total) by mouth 2 (two)  times daily as needed for muscle spasms. Patient not taking: Reported on 08/14/2017 11/18/16   Bethel Born, PA-C  HYDROcodone-acetaminophen (NORCO/VICODIN) 5-325 MG tablet Take 1-2 tablets by mouth every 6 (six) hours as needed for severe pain. Patient not taking: Reported on 08/14/2017 11/18/16   Bethel Born, PA-C  ibuprofen (ADVIL,MOTRIN) 600 MG tablet Take 1 tablet (600 mg total) by mouth every 8 (eight) hours as needed. Patient not taking: Reported on 08/14/2017 08/14/16   Merrily Brittle, MD  lisinopril (PRINIVIL,ZESTRIL) 5 MG tablet Take 1 tablet (5 mg total) by mouth daily. 11/29/17   Gilda Crease, MD    Family History Family History  Problem Relation Age of Onset  . Hyperlipidemia Mother     Social History Social History   Tobacco Use  . Smoking status: Current Some Day Smoker    Types: Cigarettes  . Smokeless tobacco: Never Used  Substance Use Topics  . Alcohol use: Yes    Comment: occasional  . Drug use: No     Allergies   Patient has no known allergies.   Review of Systems Review of Systems  Neurological: Positive for numbness and headaches.  All other systems reviewed and are negative.    Physical Exam Updated Vital Signs BP 121/80   Pulse 60   Temp 98.7 F (37.1  C) (Oral)   Resp 13   SpO2 98%   Physical Exam  Constitutional: He is oriented to person, place, and time. He appears well-developed and well-nourished. No distress.  HENT:  Head: Normocephalic and atraumatic.  Right Ear: Hearing normal.  Left Ear: Hearing normal.  Nose: Nose normal.  Mouth/Throat: Oropharynx is clear and moist and mucous membranes are normal.  Eyes: Pupils are equal, round, and reactive to light. Conjunctivae and EOM are normal.  Neck: Normal range of motion. Neck supple.  Cardiovascular: Regular rhythm, S1 normal and S2 normal. Exam reveals no gallop and no friction rub.  No murmur heard. Pulmonary/Chest: Effort normal and breath sounds normal. No  respiratory distress. He exhibits no tenderness.  Abdominal: Soft. Normal appearance and bowel sounds are normal. There is no hepatosplenomegaly. There is no tenderness. There is no rebound, no guarding, no tenderness at McBurney's point and negative Murphy's sign. No hernia.  Musculoskeletal: Normal range of motion.  Neurological: He is alert and oriented to person, place, and time. He has normal strength. No cranial nerve deficit or sensory deficit. Coordination normal. GCS eye subscore is 4. GCS verbal subscore is 5. GCS motor subscore is 6.  Extraocular muscle movement: normal No visual field cut Pupils: equal and reactive both direct and consensual response is normal No nystagmus present    Sensory function is intact to light touch, pinprick Proprioception intact  Grip strength 5/5 symmetric in upper extremities No pronator drift Normal finger to nose bilaterally  Lower extremity strength 5/5 against gravity Normal heel to shin bilaterally     Skin: Skin is warm, dry and intact. No rash noted. No cyanosis.  Psychiatric: He has a normal mood and affect. His speech is normal and behavior is normal. Thought content normal.  Nursing note and vitals reviewed.    ED Treatments / Results  Labs (all labs ordered are listed, but only abnormal results are displayed) Labs Reviewed  BASIC METABOLIC PANEL  CBC  URINALYSIS, ROUTINE W REFLEX MICROSCOPIC    EKG EKG Interpretation  Date/Time:  Saturday November 29 2017 00:13:44 EDT Ventricular Rate:  76 PR Interval:  160 QRS Duration: 90 QT Interval:  380 QTC Calculation: 427 R Axis:   -20 Text Interpretation:  Normal sinus rhythm Cannot rule out Anterior infarct , age undetermined Abnormal ECG Confirmed by Gilda Crease [id number]) on 11/29/2017 3:03:14 AM   Radiology Ct Head Wo Contrast  Result Date: 11/29/2017 CLINICAL DATA:  Sharp pain and numbness to the left side of the head starting at 10 p.m. Chronic headaches.  EXAM: CT HEAD WITHOUT CONTRAST TECHNIQUE: Contiguous axial images were obtained from the base of the skull through the vertex without intravenous contrast. COMPARISON:  11/18/2016 FINDINGS: Brain: No evidence of acute infarction, hemorrhage, hydrocephalus, extra-axial collection or mass lesion/mass effect. Vascular: No hyperdense vessel or unexpected calcification. Skull: Normal. Negative for fracture or focal lesion. Sinuses/Orbits: No acute finding. Other: None. IMPRESSION: Normal head CT. Electronically Signed   By: Burman Nieves M.D.   On: 11/29/2017 02:39    Procedures Procedures (including critical care time)  Medications Ordered in ED Medications  sodium chloride 0.9 % bolus 1,000 mL (1,000 mLs Intravenous New Bag/Given 11/29/17 0236)  dexamethasone (DECADRON) injection 10 mg (10 mg Intravenous Given 11/29/17 0237)  prochlorperazine (COMPAZINE) injection 10 mg (10 mg Intravenous Given 11/29/17 0237)  ketorolac (TORADOL) 30 MG/ML injection 30 mg (30 mg Intravenous Given 11/29/17 0237)  diphenhydrAMINE (BENADRYL) injection 25 mg (25 mg Intravenous Given 11/29/17 0237)  Initial Impression / Assessment and Plan / ED Course  I have reviewed the triage vital signs and the nursing notes.  Pertinent labs & imaging results that were available during my care of the patient were reviewed by me and considered in my medical decision making (see chart for details).     Patient complaining of frequent, daily headaches for more than a year.  He has had intermittent episodes of left-sided numbness and tingling with this.  He has a nonfocal, essentially normal neurologic examination today.  Head CT normal, blood work unremarkable.  No renal dysfunction.  Patient administered migraine cocktail and has had complete resolution of his headache.  No further numbness or tingling.  Will follow follow-up with neurology.  Blood pressure has been normal here but he did document some fairly high pressures in  the past and has been on medications.  Will Rx low-dose lisinopril.  Final Clinical Impressions(s) / ED Diagnoses   Final diagnoses:  Bad headache  Essential hypertension    ED Discharge Orders         Ordered    lisinopril (PRINIVIL,ZESTRIL) 5 MG tablet  Daily     11/29/17 0329           Gilda Crease, MD 11/29/17 [phone]

## 2017-11-29 NOTE — ED Notes (Signed)
Pt reports headaches, htn, and numbness for over a year now. Unable to get relief with OTC meds.

## 2017-12-01 ENCOUNTER — Encounter: Payer: Self-pay | Admitting: Neurology

## 2018-02-04 NOTE — Progress Notes (Deleted)
NEUROLOGY CONSULTATION NOTE  Jaidin Richison MRN: [id number] DOB: 01-Sep-1985  Referring provider: Jaci Carrel, MD (ED referral) Primary care provider: No PCP  Reason for consult:  headache  HISTORY OF PRESENT ILLNESS: Richard Ramirez is a 32 year old male with hypertension who presents for headache.  History supplemented by ED notes.  Onset:  *** Location:  *** Quality:  *** Intensity:  ***.  *** denies new headache, thunderclap headache or severe headache that wakes *** from sleep. Aura:  *** Prodrome:  *** Postdrome:  *** Associated symptoms:  ***.  *** denies associated unilateral numbness or weakness. Duration:  *** Frequency:  *** Frequency of abortive medication: *** Triggers:  *** Exacerbating factors:  *** Relieving factors:  *** Activity:  ***  He was evaluated in the ED on 11/29/17 where CT of head was performed, which was personally reviewed and negative for acute findings.  Of note, he was admitted to [facility] on 02/13/15 for onset of left facial weakness and numbness.  MRI of brain was personally reviewed and was unremarkable.  He was diagnosed with a Bell's palsy and discharged on course of steroids.  He returned to the ED on 03/09/15 with onset of left facial numbness and left upper and lower extremity numbness and weakness.  CT and subsequent MRI of brain were personally reviewed and demonstrated no acute findings.  He was evaluated by neurology at the time who noted inconsistent exam and felt that the left sided hemiparesis of the arm and leg was functional.  He has been seen in the ED on multiple occasions for atypical chest pain.  He was evaluated in the ED on 11/17/16 for left sided neck pain with tingling down the left arm.  CT cervical spine and CTA of head and neck were personally reviewed and unremarkable.  Current NSAIDS:  *** Current analgesics:  *** Current triptans:  *** Current ergotamine:  *** Current anti-emetic:  *** Current  muscle relaxants:  *** Current anti-anxiolytic:  *** Current sleep aide:  *** Current Antihypertensive medications:  *** Current Antidepressant medications:  *** Current Anticonvulsant medications:  *** Current anti-CGRP:  *** Current Vitamins/Herbal/Supplements:  *** Current Antihistamines/Decongestants:  *** Other therapy:  *** Other medication:  ***  Past NSAIDS:  *** Past analgesics:  *** Past abortive triptans:  *** Past abortive ergotamine:  *** Past muscle relaxants:  *** Past anti-emetic:  *** Past antihypertensive medications:  *** Past antidepressant medications:  *** Past anticonvulsant medications:  *** Past anti-CGRP:  *** Past vitamins/Herbal/Supplements:  *** Past antihistamines/decongestants:  *** Other past therapies:  ***  Caffeine:  *** Alcohol:  *** Smoker:  *** Diet:  *** Exercise:  *** Depression:  ***; Anxiety:  *** Other pain:  *** Sleep hygiene:  *** Family history of headache:  ***  PAST MEDICAL HISTORY: Past Medical History:  Diagnosis Date  . Bell's palsy 01/2015  . Hypertension   . Reflux     PAST SURGICAL HISTORY: Past Surgical History:  Procedure Laterality Date  . FINGER SURGERY Right 2002   R small finger  . WRIST SURGERY Left 2010    MEDICATIONS: Current Outpatient Medications on File Prior to Visit  Medication Sig Dispense Refill  . acetaminophen (TYLENOL) 325 MG tablet Take 2 tablets (650 mg total) by mouth every 6 (six) hours as needed for mild pain, moderate pain, fever or headache. (Patient not taking: Reported on 08/14/2017) 30 tablet 0  . cyclobenzaprine (FLEXERIL) 10 MG tablet Take 1 tablet (10 mg total) by mouth  2 (two) times daily as needed for muscle spasms. (Patient not taking: Reported on 08/14/2017) 20 tablet 0  . HYDROcodone-acetaminophen (NORCO/VICODIN) 5-325 MG tablet Take 1-2 tablets by mouth every 6 (six) hours as needed for severe pain. (Patient not taking: Reported on 08/14/2017) 10 tablet 0  . ibuprofen  (ADVIL,MOTRIN) 600 MG tablet Take 1 tablet (600 mg total) by mouth every 8 (eight) hours as needed. (Patient not taking: Reported on 08/14/2017) 30 tablet 0  . lisinopril (PRINIVIL,ZESTRIL) 5 MG tablet Take 1 tablet (5 mg total) by mouth daily. 30 tablet 6   No current facility-administered medications on file prior to visit.     ALLERGIES: No Known Allergies  FAMILY HISTORY: Family History  Problem Relation Age of Onset  . Hyperlipidemia Mother    ***.  SOCIAL HISTORY: Social History   Socioeconomic History  . Marital status: Married    Spouse name: Not on file  . Number of children: Not on file  . Years of education: Not on file  . Highest education level: Not on file  Occupational History  . Not on file  Social Needs  . Financial resource strain: Not on file  . Food insecurity:    Worry: Not on file    Inability: Not on file  . Transportation needs:    Medical: Not on file    Non-medical: Not on file  Tobacco Use  . Smoking status: Current Some Day Smoker    Types: Cigarettes  . Smokeless tobacco: Never Used  Substance and Sexual Activity  . Alcohol use: Yes    Comment: occasional  . Drug use: No  . Sexual activity: Not on file  Lifestyle  . Physical activity:    Days per week: Not on file    Minutes per session: Not on file  . Stress: Not on file  Relationships  . Social connections:    Talks on phone: Not on file    Gets together: Not on file    Attends religious service: Not on file    Active member of club or organization: Not on file    Attends meetings of clubs or organizations: Not on file    Relationship status: Not on file  . Intimate partner violence:    Fear of current or ex partner: Not on file    Emotionally abused: Not on file    Physically abused: Not on file    Forced sexual activity: Not on file  Other Topics Concern  . Not on file  Social History Narrative  . Not on file    REVIEW OF SYSTEMS: Constitutional: No fevers, chills,  or sweats, no generalized fatigue, change in appetite Eyes: No visual changes, double vision, eye pain Ear, nose and throat: No hearing loss, ear pain, nasal congestion, sore throat Cardiovascular: No chest pain, palpitations Respiratory:  No shortness of breath at rest or with exertion, wheezes GastrointestinaI: No nausea, vomiting, diarrhea, abdominal pain, fecal incontinence Genitourinary:  No dysuria, urinary retention or frequency Musculoskeletal:  No neck pain, back pain Integumentary: No rash, pruritus, skin lesions Neurological: as above Psychiatric: No depression, insomnia, anxiety Endocrine: No palpitations, fatigue, diaphoresis, mood swings, change in appetite, change in weight, increased thirst Hematologic/Lymphatic:  No purpura, petechiae. Allergic/Immunologic: no itchy/runny eyes, nasal congestion, recent allergic reactions, rashes  PHYSICAL EXAM: *** General: No acute distress.  Patient appears ***-groomed.  *** Head:  Normocephalic/atraumatic Eyes:  fundi examined but not visualized Neck: supple, no paraspinal tenderness, full range of motion Back: No  paraspinal tenderness Heart: regular rate and rhythm Lungs: Clear to auscultation bilaterally. Vascular: No carotid bruits. Neurological Exam: Mental status: alert and oriented to person, place, and time, recent and remote memory intact, fund of knowledge intact, attention and concentration intact, speech fluent and not dysarthric, language intact. Cranial nerves: CN I: not tested CN II: pupils equal, round and reactive to light, visual fields intact CN III, IV, VI:  full range of motion, no nystagmus, no ptosis CN V: facial sensation intact CN VII: upper and lower face symmetric CN VIII: hearing intact CN IX, X: gag intact, uvula midline CN XI: sternocleidomastoid and trapezius muscles intact CN XII: tongue midline Bulk & Tone: normal, no fasciculations. Motor:  5/5 throughout *** Sensation:  Pinprick ***  temperature *** and vibration sensation intact.  ***. Deep Tendon Reflexes:  2+ throughout, *** toes downgoing.  *** Finger to nose testing:  Without dysmetria.  *** Heel to shin:  Without dysmetria.  *** Gait:  Normal station and stride.  Able to turn and tandem walk. Romberg ***.  IMPRESSION: ***  PLAN: ***  Thank you for allowing me to take part in the care of this patient.  Shon MilletAdam Jaffe, DO  CC: ***

## 2018-02-05 ENCOUNTER — Ambulatory Visit: Payer: 59 | Admitting: Neurology

## 2018-02-05 ENCOUNTER — Encounter: Payer: Self-pay | Admitting: Neurology

## 2018-02-05 DIAGNOSIS — Z029 Encounter for administrative examinations, unspecified: Secondary | ICD-10-CM

## 2018-03-31 ENCOUNTER — Other Ambulatory Visit: Payer: Self-pay

## 2018-03-31 ENCOUNTER — Emergency Department (HOSPITAL_COMMUNITY)
Admission: EM | Admit: 2018-03-31 | Discharge: 2018-04-01 | Disposition: A | Discharge status: Home / Self Care | Payer: 59 | Attending: Emergency Medicine | Admitting: Emergency Medicine

## 2018-03-31 ENCOUNTER — Encounter (HOSPITAL_COMMUNITY): Payer: Self-pay | Admitting: Emergency Medicine

## 2018-03-31 DIAGNOSIS — R51 Headache: Secondary | ICD-10-CM | POA: Insufficient documentation

## 2018-03-31 DIAGNOSIS — M545 Low back pain, unspecified: Secondary | ICD-10-CM

## 2018-03-31 DIAGNOSIS — Z79899 Other long term (current) drug therapy: Secondary | ICD-10-CM | POA: Insufficient documentation

## 2018-03-31 DIAGNOSIS — R519 Headache, unspecified: Secondary | ICD-10-CM

## 2018-03-31 DIAGNOSIS — I10 Essential (primary) hypertension: Secondary | ICD-10-CM | POA: Insufficient documentation

## 2018-03-31 DIAGNOSIS — R109 Unspecified abdominal pain: Secondary | ICD-10-CM | POA: Diagnosis present

## 2018-03-31 DIAGNOSIS — F17210 Nicotine dependence, cigarettes, uncomplicated: Secondary | ICD-10-CM | POA: Insufficient documentation

## 2018-03-31 LAB — COMPREHENSIVE METABOLIC PANEL
ALT: 26 U/L (ref 0–44)
AST: 31 U/L (ref 15–41)
Albumin: 3.9 g/dL (ref 3.5–5.0)
Alkaline Phosphatase: 46 U/L (ref 38–126)
Anion gap: 10 (ref 5–15)
BUN: 19 mg/dL (ref 6–20)
CHLORIDE: 104 mmol/L (ref 98–111)
CO2: 23 mmol/L (ref 22–32)
CREATININE: 1.11 mg/dL (ref 0.61–1.24)
Calcium: 9.2 mg/dL (ref 8.9–10.3)
GFR calc Af Amer: >60 mL/min (ref >60)
GFR calc non Af Amer: >60 mL/min (ref >60)
Glucose, Bld: 102 mg/dL — ABNORMAL HIGH (ref 70–99)
POTASSIUM: 4.2 mmol/L (ref 3.5–5.1)
SODIUM: 137 mmol/L (ref 135–145)
Total Bilirubin: 0.9 mg/dL (ref 0.3–1.2)
Total Protein: 7.3 g/dL (ref 6.5–8.1)

## 2018-03-31 LAB — CBC
HCT: 43.5 % (ref 39.0–52.0)
HEMOGLOBIN: 14.4 g/dL (ref 13.0–17.0)
MCH: 27.8 pg (ref 26.0–34.0)
MCHC: 33.1 g/dL (ref 30.0–36.0)
MCV: 84 fL (ref 80.0–100.0)
Platelets: 217 10*3/uL (ref 150–400)
RBC: 5.18 MIL/uL (ref 4.22–5.81)
RDW: 12.3 % (ref 11.5–15.5)
WBC: 7.8 10*3/uL (ref 4.0–10.5)
nRBC: 0 % (ref 0.0–0.2)

## 2018-03-31 LAB — URINALYSIS, ROUTINE W REFLEX MICROSCOPIC
Bilirubin Urine: NEGATIVE
GLUCOSE, UA: NEGATIVE mg/dL
Hgb urine dipstick: NEGATIVE
Ketones, ur: NEGATIVE mg/dL
Leukocytes,Ua: NEGATIVE
NITRITE: NEGATIVE
PH: 6 (ref 5.0–8.0)
Protein, ur: NEGATIVE mg/dL
SPECIFIC GRAVITY, URINE: 1.015 (ref 1.005–1.030)

## 2018-03-31 LAB — LIPASE, BLOOD: LIPASE: 30 U/L (ref 11–51)

## 2018-03-31 MED ORDER — SODIUM CHLORIDE 0.9% FLUSH: 3.0000 mL | Freq: Once | INTRAVENOUS | Status: DC

## 2018-03-31 NOTE — ED Triage Notes (Addendum)
Patient with multiple complaints of lower back pain, kidney area and abdominal pain.  He states that it feels like someone is poking him in his back and the pain radiates to his stomach.  Patient complaining of HA that he has had for over a week.  He states that the back pain and abdominal pain started when he got to work [employer].  Patient does have Bell's Palsy.

## 2018-04-01 ENCOUNTER — Other Ambulatory Visit: Payer: Self-pay

## 2018-04-01 MED ORDER — DEXAMETHASONE SODIUM PHOSPHATE 10 MG/ML IJ SOLN
10.0000 mg | Freq: Once | INTRAMUSCULAR | Status: AC
  Administered 2018-04-01: 10 mg via INTRAVENOUS
  Filled 2018-04-01: qty 1

## 2018-04-01 MED ORDER — METOCLOPRAMIDE HCL 5 MG/ML IJ SOLN
10.0000 mg | Freq: Once | INTRAMUSCULAR | Status: AC
  Administered 2018-04-01: 10 mg via INTRAVENOUS
  Filled 2018-04-01: qty 2

## 2018-04-01 MED ORDER — SODIUM CHLORIDE 0.9 % IV BOLUS
1000.0000 mL | Freq: Once | INTRAVENOUS | Status: AC
  Administered 2018-04-01: 1000 mL via INTRAVENOUS

## 2018-04-01 MED ORDER — KETOROLAC TROMETHAMINE 30 MG/ML IJ SOLN
30.0000 mg | Freq: Once | INTRAMUSCULAR | Status: AC
  Administered 2018-04-01: 30 mg via INTRAVENOUS
  Filled 2018-04-01: qty 1

## 2018-04-01 MED ORDER — DIPHENHYDRAMINE HCL 50 MG/ML IJ SOLN
25.0000 mg | Freq: Once | INTRAMUSCULAR | Status: AC
  Administered 2018-04-01: 25 mg via INTRAVENOUS
  Filled 2018-04-01: qty 1

## 2018-04-01 NOTE — ED Notes (Signed)
Patient verbalizes understanding of discharge instructions. Opportunity for questioning and answers were provided. Armband removed by staff, pt discharged from ED. Ambulated out to lobby  

## 2018-04-01 NOTE — ED Provider Notes (Signed)
[facility]   CSN: [id number] Arrival date & time: 03/31/18  2033     History   Chief Complaint Chief Complaint  Patient presents with  . Migraine  . Abdominal Pain    HPI Richard Ramirez is a 33 y.o. male.  The history is provided by the patient.  He has history of hypertension and comes in complaining of back pain, abdominal pain, headache.  Over the last week, he has been having an intermittent left-sided headache with pain starting in the area around his left ear and radiating to the left side of his jaw.  Pain is rated at 8/10.  Nothing makes it better, nothing makes it worse.  He specifically denies photophobia and phonophobia.  Over the last 3 days, he has been having some intermittent pains in his back.  He still feels like there is something sharp poking him in his lumbar area bilaterally and pain is radiated around to his mid abdominal area.  There was mild nausea but no vomiting.  He denies any weakness, numbness, tingling.  Pain is rated at 8/10.  His job does involve a lot of lifting, but he states that he always does it in proper ergonomic fashion.  He denies any recent trauma.  He has not taken any medicine for it.  Past Medical History:  Diagnosis Date  . Bell's palsy 01/2015  . Hypertension   . Reflux     Patient Active Problem List   Diagnosis Date Noted  . Tonsillar abscess 03/14/2016  . Strep pharyngitis   . Weakness 02/13/2015  . 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 FINGER SURGERY Right 2002   R small finger  . WRIST SURGERY Left 2010        Home Medications    Prior to Admission medications   Medication Sig Start Date End Date Taking? Authorizing Provider  acetaminophen (TYLENOL) 325 MG tablet Take 2 tablets (650 mg total) by mouth every 6 (six) hours as needed for mild pain, moderate pain, fever or headache. Patient not taking: Reported on  08/14/2017 03/15/16   Almon HerculesGonfa, Taye T, MD  cyclobenzaprine (FLEXERIL) 10 MG tablet Take 1 tablet (10 mg total) by mouth 2 (two) times daily as needed for muscle spasms. Patient not taking: Reported on 08/14/2017 11/18/16   Bethel BornGekas, Kelly Marie, PA-C  HYDROcodone-acetaminophen (NORCO/VICODIN) 5-325 MG tablet Take 1-2 tablets by mouth every 6 (six) hours as needed for severe pain. Patient not taking: Reported on 08/14/2017 11/18/16   Bethel BornGekas, Kelly Marie, PA-C  ibuprofen (ADVIL,MOTRIN) 600 MG tablet Take 1 tablet (600 mg total) by mouth every 8 (eight) hours as needed. Patient not taking: Reported on 08/14/2017 08/14/16   Merrily Brittleifenbark, Neil, MD  lisinopril (PRINIVIL,ZESTRIL) 5 MG tablet Take 1 tablet (5 mg total) by mouth daily. 11/29/17   Gilda CreasePollina, Christopher J, MD    Family History Family History  Problem Relation Age of Onset  . Hyperlipidemia Mother     Social History Social History   Tobacco Use  . Smoking status: Current Some Day Smoker    Types: Cigarettes  . Smokeless tobacco: Never Used  Substance Use Topics  . Alcohol use: Yes    Comment: occasional  . Drug use: No     Allergies   Patient has no known allergies.   Review of Systems Review of Systems  All other systems reviewed and are negative.    Physical Exam Updated  Vital Signs BP 128/83   Pulse 75   Temp 99.1 F (37.3 C) (Oral)   Resp 18   SpO2 99%   Physical Exam Vitals signs and nursing note reviewed.    33 year old male, resting comfortably and in no acute distress. Vital signs are normal. Oxygen saturation is 99%, which is normal. Head is normocephalic and atraumatic. PERRLA, EOMI. Oropharynx is clear. There is tenderness to palpation over both temporalis muscles, and over insertion of the left paracervical muscles. Neck is nontender and supple without adenopathy or JVD. Back has mild paralumbar tenderness bilaterally with mild bilateral paralumbar spasm.  There is no CVA tenderness.  Straight leg raise is  negative. Lungs are clear without rales, wheezes, or rhonchi. Chest is nontender. Heart has regular rate and rhythm without murmur. Abdomen is soft, flat, nontender without masses or hepatosplenomegaly and peristalsis is normoactive. Extremities have no cyanosis or edema, full range of motion is present. Skin is warm and dry without rash. Neurologic: Mental status is normal, cranial nerves are intact, there are no motor or sensory deficits.  ED Treatments / Results  Labs (all labs ordered are listed, but only abnormal results are displayed) Labs Reviewed  COMPREHENSIVE METABOLIC PANEL - Abnormal; Notable for the following components:      Result Value   Glucose, Bld 102 (*)    All other components within normal limits  URINALYSIS, ROUTINE W REFLEX MICROSCOPIC - Abnormal; Notable for the following components:   Color, Urine STRAW (*)    All other components within normal limits  LIPASE, BLOOD  CBC   Procedures Procedures   Medications Ordered in ED Medications  sodium chloride flush (NS) 0.9 % injection 3 mL (3 mLs Intravenous Not Given 04/01/18 0231)  sodium chloride 0.9 % bolus 1,000 mL (0 mLs Intravenous Stopped 04/01/18 0407)  ketorolac (TORADOL) 30 MG/ML injection 30 mg (30 mg Intravenous Given 04/01/18 0220)  metoCLOPramide (REGLAN) injection 10 mg (10 mg Intravenous Given 04/01/18 0221)  diphenhydrAMINE (BENADRYL) injection 25 mg (25 mg Intravenous Given 04/01/18 0220)  dexamethasone (DECADRON) injection 10 mg (10 mg Intravenous Given 04/01/18 0221)     Initial Impression / Assessment and Plan / ED Course  I have reviewed the triage vital signs and the nursing notes.  Pertinent lab results that were available during my care of the patient were reviewed by me and considered in my medical decision making (see chart for details).  Back pain is probably musculoskeletal.  No evidence of significant abdominal pathology.  Headache appears to be muscle contraction headache.   Laboratory work-up is normal and urinalysis is normal.  He will be given IV fluids, metoclopramide, diphenhydramine, ketorolac, dexamethasone.  Old records are reviewed, and he does have prior ED visits for headaches.  Following above-noted treatment, he noted significant improvement.  He is discharged with instructions to take over-the-counter analgesics as needed for pain.  Final Clinical Impressions(s) / ED Diagnoses   Final diagnoses:  Bad headache  Acute bilateral low back pain without sciatica    ED Discharge Orders    None       Dione BoozeGlick, Corie Vavra, MD 04/01/18 [phone]

## 2018-04-01 NOTE — Discharge Instructions (Signed)
Apply ice as needed.  Take ibuprofen or naproxen as needed for pain.  You can add acetaminophen for additional pain relief.

## 2018-05-27 ENCOUNTER — Emergency Department (HOSPITAL_COMMUNITY)
Admission: EM | Admit: 2018-05-27 | Discharge: 2018-05-27 | Disposition: A | Discharge status: Home / Self Care | Payer: 59 | Attending: Emergency Medicine | Admitting: Emergency Medicine

## 2018-05-27 ENCOUNTER — Other Ambulatory Visit: Payer: Self-pay

## 2018-05-27 ENCOUNTER — Encounter (HOSPITAL_COMMUNITY): Payer: Self-pay | Admitting: Emergency Medicine

## 2018-05-27 DIAGNOSIS — G510 Bell's palsy: Secondary | ICD-10-CM | POA: Diagnosis not present

## 2018-05-27 DIAGNOSIS — R0789 Other chest pain: Secondary | ICD-10-CM

## 2018-05-27 DIAGNOSIS — I10 Essential (primary) hypertension: Secondary | ICD-10-CM | POA: Insufficient documentation

## 2018-05-27 DIAGNOSIS — F17210 Nicotine dependence, cigarettes, uncomplicated: Secondary | ICD-10-CM | POA: Insufficient documentation

## 2018-05-27 DIAGNOSIS — K219 Gastro-esophageal reflux disease without esophagitis: Secondary | ICD-10-CM | POA: Insufficient documentation

## 2018-05-27 DIAGNOSIS — Z79899 Other long term (current) drug therapy: Secondary | ICD-10-CM | POA: Diagnosis not present

## 2018-05-27 LAB — CBG MONITORING, ED: Glucose-Capillary: 93 mg/dL (ref 70–99)

## 2018-05-27 LAB — TROPONIN I: Troponin I: <0.03 ng/mL (ref <0.03)

## 2018-05-27 MED ORDER — SODIUM CHLORIDE 0.9% FLUSH: 3.0000 mL | Freq: Once | INTRAVENOUS | Status: DC

## 2018-05-27 MED ORDER — HYDROXYZINE HCL 25 MG PO TABS: 25.0000 mg | ORAL_TABLET | Freq: Four times a day (QID) | ORAL | 0 refills | Status: AC

## 2018-05-27 MED ORDER — LIDOCAINE VISCOUS HCL 2 % MT SOLN
15.0000 mL | Freq: Once | OROMUCOSAL | Status: AC
  Administered 2018-05-27: 03:00:00 15 mL via ORAL
  Filled 2018-05-27: qty 15

## 2018-05-27 MED ORDER — ALUM & MAG HYDROXIDE-SIMETH 200-200-20 MG/5ML PO SUSP
30.0000 mL | Freq: Once | ORAL | Status: AC
  Administered 2018-05-27: 03:00:00 30 mL via ORAL
  Filled 2018-05-27: qty 30

## 2018-05-27 MED ORDER — OMEPRAZOLE 20 MG PO CPDR: 20.0000 mg | DELAYED_RELEASE_CAPSULE | Freq: Every day | ORAL | 0 refills | Status: DC

## 2018-05-27 NOTE — ED Triage Notes (Signed)
Pt reported his blood pressure was high, after encouraging pt to be more forthcoming w/ information he stated that he has been "having a little chest pressure here and there."  Denies n/v/d, weakness.

## 2018-05-27 NOTE — ED Provider Notes (Signed)
ED ECG REPORT   Date: 05/27/2018  Rate: 77  Rhythm: normal sinus rhythm  QRS Axis: normal  Intervals: normal  ST/T Wave abnormalities: ST elevations diffusely  Conduction Disutrbances:none  Narrative Interpretation: Q waves in 3 and avF unchanged with diffuse J point elevation  Old EKG Reviewed: unchanged  I have personally reviewed the EKG tracing and agree with the computerized printout as noted.    Glynn Octave, MD 05/27/18 [phone]

## 2018-05-27 NOTE — ED Notes (Signed)
Patient verbalizes understanding of discharge instructions. Opportunity for questioning and answers were provided. Armband removed by staff, pt discharged from ED. Ambulated to lobby  

## 2018-05-27 NOTE — Discharge Instructions (Addendum)
Thank you for allowing me to care for you today in the Emergency Department.   Take 1 tablet of omeprazole daily for the next 2 weeks.  During a 2-week trial of this medication may significantly help with heartburn and acid reflux.  I have also attached some food choices which may also help to reduce acid reflux.  If you have worsening anxiety, you can try taking 1 tablet of Vistaril every 6 hours.  Do not work or drive while taking this medication until you know how it affects you because it may make you drowsy.  If you continue to have worsening tingling in your hands and feet or if you are feeling overly anxious, you can call and schedule follow-up appointment with [facility].  Their contact information is listed above.  Please follow-up with your primary care provider if you continue to have intermittent episodes of chest pain as you may need to see a cardiologist.  Return to the emergency department if you develop new or worsening symptoms including chest pain with severe shortness of breath, sweating, dizziness, lightheadedness, if you pass out, if your lips turn blue, with new weakness or numbness, visual changes, or other new, concerning symptoms.

## 2018-05-27 NOTE — ED Provider Notes (Signed)
[facility] EMERGENCY DEPARTMENT Provider Note   CSN: [id number] Arrival date & time: 05/27/18  0135    History   Chief Complaint Chief Complaint  Patient presents with   Hypertension   Chest Pain    HPI Richard Ramirez is a 33 y.o. male with a history of Bell's palsy, HTN, and GERD who presents to the emergency department with a chief complaint of chest pain.  The patient reports that he was at work and bent over a pallet and developed "grabbing, squeezing" left-sided chest pain that resolved in less than a minute.  He reports that after the "grabbing" pain resolved that he had some "burning", nonradiating pain in the center of his chest.    He denies shortness of breath, pain radiating up the neck or down the arm, numbness, weakness, dizziness, lightheadedness, headache, visual changes, palpitations, wheezing, leg swelling, nausea, vomiting, back pain, or abdominal pain.  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  He reports the the following readings over a 25-minute time: 197/113, 219/132, 185/110, and 213/122.  He reports that he then used a second blood pressure cuff to check his blood pressure within 5 minutes of the last reading and it was 147/90.  He states "I think the first blood pressure cuff needed to be re-calibrated."  He reports that he was not having any chest pain at the time when he initially checked his blood pressure with the first cuff.  He reports that he has been under an increased amount of stress recently.  He reports that his ex, who shares custody of his children, has been ill recently so he has had sole custody of his children for the last few days.  He also notes that he has been having some tingling in his bilateral hands and feet intermittently over the last few days in addition to worsening  anxiety.  He reports that he ate a chicken sandwich from a fast food restaurant for dinner.  He takes lisinopril for hypertension.  No other home medications.  He denies a family history of cardiovascular disease.  Family history is only positive for diabetes mellitus.  No personal history of VTE.  Of note, per chart review the patient has had several CVA work-ups, which have all been unremarkable.  He has not followed up with cardiology.  HPI: A 33 year old patient with a history of hypertension and obesity presents for evaluation of chest pain. Initial onset of pain was less than one hour ago. The patient's chest pain is well-localized and is not worse with exertion. The patient's chest pain is middle- or left-sided, is not described as heaviness/pressure/tightness, is not sharp and does not radiate to the arms/jaw/neck. The patient does not complain of nausea and denies diaphoresis. The patient has no history of stroke, has no history of peripheral artery disease, has not smoked in the past 90 days, denies any history of treated diabetes, has no relevant family history of coronary artery disease (first degree relative at less than age 8) and has no history of hypercholesterolemia.   The history is provided by the patient. No language interpreter was used.    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 Authorizing Provider  lisinopril (PRINIVIL,ZESTRIL) 5 MG tablet Take 1 tablet (5 mg total) by mouth daily. 11/29/17  Yes Pollina, Gwenyth Allegra, MD   hydrOXYzine (ATARAX/VISTARIL) 25 MG tablet Take 1 tablet (25 mg total) by mouth every 6 (six) hours. 05/27/18   Mylin Gignac A, PA-C  omeprazole (PRILOSEC) 20 MG capsule Take 1 capsule (20 mg total) by mouth daily for 14 days. 05/27/18 06/10/18  Ayaat Jansma, Laymond Purser, PA-C    Family History Family History  Problem Relation Age of Onset   Hyperlipidemia Mother     Social History Social History   Tobacco Use   Smoking status: Current Some Day Smoker    Types: Cigarettes   Smokeless tobacco: Never Used  Substance Use Topics   Alcohol use: Yes    Comment: occasional   Drug use: No     Allergies   Chlorhexidine   Review of Systems Review of Systems  Constitutional: Negative for appetite change and fever.  HENT: Negative for congestion and sore throat.   Respiratory: Negative for cough, chest tightness, shortness of breath and wheezing.   Cardiovascular: Positive for chest pain. Negative for palpitations and leg swelling.  Gastrointestinal: Negative for abdominal pain, constipation, diarrhea, nausea and vomiting.  Genitourinary: Negative for dysuria, hematuria and urgency.  Musculoskeletal: Negative for back pain.  Skin: Negative for rash.  Allergic/Immunologic: Negative for immunocompromised state.  Neurological: Negative for weakness, numbness and headaches.  Psychiatric/Behavioral: Negative for confusion. The patient is nervous/anxious.      Physical Exam Updated Vital Signs BP (!) 152/80 (BP Location: Left Arm)    Pulse 68    Temp 97.9 F (36.6 C) (Oral)    Resp 16    Ht 6' 3"  (1.905 m)    Wt (!) 149.2 kg    SpO2 99%    BMI 41.12 kg/m   Physical Exam Vitals signs and nursing note reviewed.  Constitutional:      General: He is not in acute distress.    Appearance: He is well-developed. He is obese. He is not ill-appearing, toxic-appearing or diaphoretic.  HENT:     Head: Normocephalic.  Eyes:     General: No scleral icterus.    Extraocular Movements: Extraocular  movements intact.     Conjunctiva/sclera: Conjunctivae normal.     Pupils: Pupils are equal, round, and reactive to light.  Neck:     Musculoskeletal: Normal range of motion and neck supple. No neck rigidity or muscular tenderness.     Vascular: No carotid bruit.  Cardiovascular:     Rate and Rhythm: Normal rate and regular rhythm.     Heart sounds: No murmur.     Comments: Reproducible tenderness to palpation of the left chest wall, over the left breast.  No palpable masses.  No crepitus or step-offs.  No overlying warmth, erythema, edema, wounds, or rashes. Pulmonary:     Effort: Pulmonary effort is normal. No respiratory distress.     Breath sounds: No stridor. No wheezing, rhonchi or rales.  Chest:     Chest wall: Tenderness present.  Abdominal:  General: Abdomen is flat. There is no distension.     Palpations: Abdomen is soft. There is no mass.     Tenderness: There is no abdominal tenderness. There is no right CVA tenderness, left CVA tenderness, guarding or rebound.     Hernia: No hernia is present.  Musculoskeletal:     Right lower leg: No edema.     Left lower leg: No edema.  Lymphadenopathy:     Cervical: No cervical adenopathy.  Skin:    General: Skin is warm and dry.     Capillary Refill: Capillary refill takes less than 2 seconds.  Neurological:     Mental Status: He is alert.  Psychiatric:        Behavior: Behavior normal.      ED Treatments / Results  Labs (all labs ordered are listed, but only abnormal results are displayed) Labs Reviewed  TROPONIN I  CBG MONITORING, ED    EKG None  Radiology No results found.  Procedures Procedures (including critical care time)  Medications Ordered in ED Medications  sodium chloride flush (NS) 0.9 % injection 3 mL (3 mLs Intravenous Not Given 05/27/18 0322)  alum & mag hydroxide-simeth (MAALOX/MYLANTA) 200-200-20 MG/5ML suspension 30 mL (30 mLs Oral Given 05/27/18 0254)    And  lidocaine (XYLOCAINE) 2 %  viscous mouth solution 15 mL (15 mLs Oral Given 05/27/18 0255)     Initial Impression / Assessment and Plan / ED Course  I have reviewed the triage vital signs and the nursing notes.  Pertinent labs & imaging results that were available during my care of the patient were reviewed by me and considered in my medical decision making (see chart for details).  33 year old male with a history of Bell's palsy, HTN, and GERD presenting with left-sided "grabbing, squeezing" chest pain that lasted for less than 1 minute and began while he was leaning over a pallet while he was at work.  No other associated symptoms.  He reports that he then developed some burning chest pain. e reports that he ate fast food for dinner.  He has no other associated symptoms.  On chart review, the patient has had multiple, extensive previous chest pain work-ups in the ER.  He was concerned that he had hypertension at his job, but after switching to another blood pressure cuff his blood pressure was normal on recheck.  He is having some burning chest pain at this time, but is otherwise asymptomatic other than when the left anterior chest wall is palpitated and the "gripping" CP he previously had was reproduced.  There could be an underlying musculoskeletal component.  He has no family history of CVD and his only risk factor is obesity.  His blood pressure has been well controlled since arrival in the ER.  Heart score is low risk.  EKG is unchanged from previous.  Troponin is negative.  Will give GI cocktail for burning chest pain.  Clinical Course as of May 27 715  Wed May 27, 2018  0441 On patient re-evaluation, pain has resolved after GI cocktail.    [MM]    Clinical Course User Index [MM] Lundynn Cohoon A, PA-C   The patient's blood pressure has been reassuring.  The patient also does express worsening recent stress due to COVID-19 and having increased care of his children as his ex has recently been ill.  He does report that  he has been having some tingling in his bilateral hands and feet.  I think is reasonable  to start the patient on a low-dose Vistaril for anxiety and provide him with a referral to [facility] as anxiety state has been referenced in several previous ER visits.  Doubt PE, aortic dissection, ACS, pneumonia, Coumadin 18, or esophageal rupture.  Patient was discussed with Dr. Melanee Left, attending physician.  He is hemodynamically stable and in no acute distress.  He is asymptomatic at discharge is safe for outpatient follow-up.  HEAR Score: 1   Final Clinical Impressions(s) / ED Diagnoses   Final diagnoses:  Gastroesophageal reflux disease, esophagitis presence not specified  Atypical chest pain    ED Discharge Orders         Ordered    omeprazole (PRILOSEC) 20 MG capsule  Daily     05/27/18 0428    hydrOXYzine (ATARAX/VISTARIL) 25 MG tablet  Every 6 hours     05/27/18 0428           Smita Lesh, Maree Erie A, PA-C 05/27/18 0479    Ezequiel Essex, MD 05/27/18 [phone]

## 2018-09-06 ENCOUNTER — Emergency Department (HOSPITAL_COMMUNITY): Payer: Self-pay

## 2018-09-06 ENCOUNTER — Other Ambulatory Visit: Payer: Self-pay

## 2018-09-06 ENCOUNTER — Emergency Department (HOSPITAL_COMMUNITY)
Admission: EM | Admit: 2018-09-06 | Discharge: 2018-09-06 | Disposition: A | Discharge status: Home / Self Care | Payer: Self-pay | Attending: Emergency Medicine | Admitting: Emergency Medicine

## 2018-09-06 ENCOUNTER — Encounter (HOSPITAL_COMMUNITY): Payer: Self-pay | Admitting: Emergency Medicine

## 2018-09-06 DIAGNOSIS — R1032 Left lower quadrant pain: Secondary | ICD-10-CM | POA: Insufficient documentation

## 2018-09-06 DIAGNOSIS — I10 Essential (primary) hypertension: Secondary | ICD-10-CM | POA: Insufficient documentation

## 2018-09-06 DIAGNOSIS — N12 Tubulo-interstitial nephritis, not specified as acute or chronic: Secondary | ICD-10-CM

## 2018-09-06 DIAGNOSIS — R1031 Right lower quadrant pain: Secondary | ICD-10-CM | POA: Insufficient documentation

## 2018-09-06 DIAGNOSIS — N10 Acute tubulo-interstitial nephritis: Secondary | ICD-10-CM | POA: Insufficient documentation

## 2018-09-06 DIAGNOSIS — N200 Calculus of kidney: Secondary | ICD-10-CM | POA: Insufficient documentation

## 2018-09-06 DIAGNOSIS — Z720 Tobacco use: Secondary | ICD-10-CM | POA: Insufficient documentation

## 2018-09-06 LAB — CBC WITH DIFFERENTIAL/PLATELET
Abs Immature Granulocytes: 0.09 10*3/uL — ABNORMAL HIGH (ref 0.00–0.07)
Basophils Absolute: 0 10*3/uL (ref 0.0–0.1)
Basophils Relative: 0 %
Eosinophils Absolute: 0.1 10*3/uL (ref 0.0–0.5)
Eosinophils Relative: 0 %
HCT: 45.2 % (ref 39.0–52.0)
Hemoglobin: 15.2 g/dL (ref 13.0–17.0)
Immature Granulocytes: 1 %
Lymphocytes Relative: 17 %
Lymphs Abs: 2.7 10*3/uL (ref 0.7–4.0)
MCH: 28.9 pg (ref 26.0–34.0)
MCHC: 33.6 g/dL (ref 30.0–36.0)
MCV: 85.9 fL (ref 80.0–100.0)
Monocytes Absolute: 1.4 10*3/uL — ABNORMAL HIGH (ref 0.1–1.0)
Monocytes Relative: 9 %
Neutro Abs: 11.7 10*3/uL — ABNORMAL HIGH (ref 1.7–7.7)
Neutrophils Relative %: 73 %
Platelets: 231 10*3/uL (ref 150–400)
RBC: 5.26 MIL/uL (ref 4.22–5.81)
RDW: 12.5 % (ref 11.5–15.5)
WBC: 16 10*3/uL — ABNORMAL HIGH (ref 4.0–10.5)
nRBC: 0 % (ref 0.0–0.2)

## 2018-09-06 LAB — LIPASE, BLOOD: Lipase: 24 U/L (ref 11–51)

## 2018-09-06 LAB — URINALYSIS, ROUTINE W REFLEX MICROSCOPIC
Bilirubin Urine: NEGATIVE
Glucose, UA: NEGATIVE mg/dL
Ketones, ur: NEGATIVE mg/dL
Nitrite: NEGATIVE
Protein, ur: 100 mg/dL — AB
RBC / HPF: >50 RBC/hpf — ABNORMAL HIGH (ref 0–5)
Renal Epithelial: 1
Specific Gravity, Urine: 1.027 (ref 1.005–1.030)
WBC, UA: >50 WBC/hpf — ABNORMAL HIGH (ref 0–5)
pH: 6 (ref 5.0–8.0)

## 2018-09-06 LAB — COMPREHENSIVE METABOLIC PANEL
ALT: 20 U/L (ref 0–44)
AST: 15 U/L (ref 15–41)
Albumin: 3.9 g/dL (ref 3.5–5.0)
Alkaline Phosphatase: 54 U/L (ref 38–126)
Anion gap: 11 (ref 5–15)
BUN: 13 mg/dL (ref 6–20)
CO2: 22 mmol/L (ref 22–32)
Calcium: 9.4 mg/dL (ref 8.9–10.3)
Chloride: 103 mmol/L (ref 98–111)
Creatinine, Ser: 1.07 mg/dL (ref 0.61–1.24)
GFR calc Af Amer: >60 mL/min (ref >60)
GFR calc non Af Amer: >60 mL/min (ref >60)
Glucose, Bld: 109 mg/dL — ABNORMAL HIGH (ref 70–99)
Potassium: 3.6 mmol/L (ref 3.5–5.1)
Sodium: 136 mmol/L (ref 135–145)
Total Bilirubin: 0.9 mg/dL (ref 0.3–1.2)
Total Protein: 7.8 g/dL (ref 6.5–8.1)

## 2018-09-06 MED ORDER — KETOROLAC TROMETHAMINE 30 MG/ML IJ SOLN
30.0000 mg | Freq: Once | INTRAMUSCULAR | Status: AC
  Administered 2018-09-06: 30 mg via INTRAVENOUS
  Filled 2018-09-06: qty 1

## 2018-09-06 MED ORDER — CEFTRIAXONE SODIUM 1 G IJ SOLR
1.0000 g | Freq: Once | INTRAMUSCULAR | Status: AC
  Administered 2018-09-06: 1 g via INTRAMUSCULAR
  Filled 2018-09-06: qty 10

## 2018-09-06 MED ORDER — STERILE WATER FOR INJECTION IJ SOLN
INTRAMUSCULAR | Status: AC
  Filled 2018-09-06: qty 10

## 2018-09-06 MED ORDER — SODIUM CHLORIDE 0.9 % IV BOLUS: 1000.0000 mL | Freq: Once | INTRAVENOUS | Status: DC

## 2018-09-06 MED ORDER — CIPROFLOXACIN HCL 500 MG PO TABS: 500.0000 mg | ORAL_TABLET | Freq: Two times a day (BID) | ORAL | 0 refills | Status: AC

## 2018-09-06 MED ORDER — SODIUM CHLORIDE 0.9 % IV SOLN: 1.0000 g | Freq: Once | INTRAVENOUS | Status: DC

## 2018-09-06 NOTE — ED Provider Notes (Signed)
[facility]   CSN: [id number] Arrival date & time: 09/06/18  [id number]    History   Chief Complaint Chief Complaint  Patient presents with   Hematuria    HPI Richard Ramirez is a 33 y.o. male with PMHx HTN who presents to the ED today complaining of bilateral flank pain, urinary frequency, and hematuria that began last night, worsening this morning. Pt reports hx of kidney stones a coupe of years ago but reports this pain feels worse. He has not taken anything for the pain. Denies fever, chills, nausea, vomiting, abdominal pain, diarrhea, constipation, penile discharge, penile pain, testicular swelling, testicular pain, or any other associated symptoms.        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 Authorizing Provider  ciprofloxacin (CIPRO) 500 MG tablet Take 1 tablet (500 mg total) by mouth 2 (two) times daily for 7 days. 09/06/18 09/13/18  Tanda RockersVenter, Roxann Vierra, PA-C  hydrOXYzine (ATARAX/VISTARIL) 25 MG tablet Take 1 tablet (25 mg total) by mouth every 6 (six) hours. 05/27/18   McDonald, Mia A, PA-C  lisinopril (PRINIVIL,ZESTRIL) 5 MG tablet Take 1 tablet (5 mg total) by mouth daily. 11/29/17   Gilda CreasePollina, Christopher J, MD  omeprazole (PRILOSEC) 20 MG capsule Take 1 capsule (20 mg total) by mouth daily for 14 days. 05/27/18 06/10/18  McDonald, Coral ElseMia A, PA-C    Family History Family History  Problem Relation Age of Onset   Hyperlipidemia Mother     Social History Social History   Tobacco  Use   Smoking status: Current Some Day Smoker    Types: Cigarettes   Smokeless tobacco: Never Used  Substance Use Topics   Alcohol use: Yes    Comment: occasional   Drug use: No     Allergies   Chlorhexidine   Review of Systems Review of Systems  Constitutional: Negative for chills and fever.  HENT: Negative for congestion.   Eyes: Negative for visual disturbance.  Respiratory: Negative for cough.   Cardiovascular: Negative for chest pain.  Gastrointestinal: Negative for abdominal pain, blood in stool, constipation, diarrhea, nausea and vomiting.  Genitourinary: Positive for difficulty urinating, flank pain, frequency and hematuria. Negative for discharge, dysuria, penile pain, penile swelling, scrotal swelling and testicular pain.  Musculoskeletal: Negative for myalgias.  Skin: Negative for rash.  Neurological: Negative for headaches.     Physical Exam Updated Vital Signs BP (!) 133/95 (BP Location: Right Arm)    Pulse (!) 106    Temp 99.3 F (37.4 C) (Oral)    Resp 16    Ht 6\' 3"  (1.905 m)    Wt (!) 147.4 kg    SpO2 99%    BMI 40.62 kg/m   Physical Exam Vitals signs and nursing note reviewed.  Constitutional:      Appearance: He is not ill-appearing.     Comments: Uncomfortable appearing male  HENT:     Head: Normocephalic and atraumatic.  Eyes:     Conjunctiva/sclera: Conjunctivae normal.  Neck:     Musculoskeletal: Normal range of motion and neck supple.  Cardiovascular:     Rate and Rhythm: Normal rate and regular rhythm.     Pulses: Normal pulses.  Pulmonary:     Effort: Pulmonary effort is normal.     Breath sounds: Normal breath sounds. No wheezing, rhonchi or rales.  Abdominal:     Palpations: Abdomen is soft.     Tenderness: There is abdominal tenderness. There is right CVA tenderness and left CVA tenderness. There is no guarding or rebound.     Comments: Soft, minimal tenderness throughout abdomen with palpation as well as + bilateral CVA TTP,  +BS throughout, no r/g/r, neg murphy's, neg mcburney's  Skin:    General: Skin is warm and dry.  Neurological:     Mental Status: He is alert.      ED Treatments / Results  Labs (all labs ordered are listed, but only abnormal results are displayed) Labs Reviewed  URINALYSIS, ROUTINE W REFLEX MICROSCOPIC - Abnormal; Notable for the following components:      Result Value   Color, Urine AMBER (*)    APPearance CLOUDY (*)    Hgb urine dipstick LARGE (*)    Protein, ur 100 (*)    Leukocytes,Ua MODERATE (*)    RBC / HPF >50 (*)    WBC, UA >50 (*)    Bacteria, UA FEW (*)    All other components within normal limits  COMPREHENSIVE METABOLIC PANEL - Abnormal; Notable for the following components:   Glucose, Bld 109 (*)    All other components within normal limits  CBC WITH DIFFERENTIAL/PLATELET - Abnormal; Notable for the following components:   WBC 16.0 (*)    Neutro Abs 11.7 (*)    Monocytes Absolute 1.4 (*)    Abs Immature Granulocytes 0.09 (*)    All other components within normal limits  LIPASE, BLOOD    EKG None  Radiology Ct Abdomen Pelvis Wo Contrast  Result Date: 09/06/2018 CLINICAL DATA:  Low back pain, hematuria, history of nephrolithiasis EXAM: CT ABDOMEN AND PELVIS WITHOUT CONTRAST TECHNIQUE: Multidetector CT imaging of the abdomen and pelvis was performed following the standard protocol without IV contrast. COMPARISON:  08/20/2014 FINDINGS: Lower chest: No pleural effusion. No pneumothorax. Hepatobiliary: No focal liver abnormality is seen. No gallstones, gallbladder wall thickening, or biliary dilatation. Pancreas: Unremarkable. No pancreatic ductal dilatation or surrounding inflammatory changes. Spleen: Normal in size without focal abnormality. Adrenals/Urinary Tract: Normal adrenal glands. 1 mm calculus in the mid left renal collecting system. No hydronephrosis or ureterectasis. Urinary bladder incompletely distended. Stomach/Bowel: Stomach is nondistended. Small  bowel decompressed. Normal appendix. Moderate colonic fecal material without dilatation or pathologic finding. Vascular/Lymphatic: No significant vascular findings are present. No enlarged abdominal or pelvic lymph nodes. Reproductive: Prostate is unremarkable. Other: No ascites. No free air. Musculoskeletal: No acute or significant osseous findings. IMPRESSION: 1. No acute findings. 2.  Left nephrolithiasis without hydronephrosis or ureterectasis. Electronically Signed   By: Corlis Leak  Hassell M.D.   On: 09/06/2018 10:14    Procedures Procedures (including critical care time)  Medications Ordered in ED Medications  ketorolac (TORADOL) 30 MG/ML injection 30 mg (30 mg Intravenous Given 09/06/18 0933)  cefTRIAXone (ROCEPHIN) injection 1 g (1 g Intramuscular Given 09/06/18 1048)     Initial Impression / Assessment and Plan / ED Course  I have reviewed the triage vital signs and the nursing notes.  Pertinent labs &  imaging results that were available during my care of the patient were reviewed by me and considered in my medical decision making (see chart for details).  Clinical Course as of Sep 06 1814  Sun Sep 06, 2018  0958 WBC(!): 16.0 [MV]  0958 + leuks, > 50 WBCs and RBCs; concern for pyelo vs infected stone  Urinalysis, Routine w reflex microscopic- may I&O cath if menses(!) [MV]    Clinical Course User Index [MV] Eustaquio Maize, PA-C   33 year old male with hx of kidney stones presents with complaints of bilateral flank pain, difficulty urinating this morning, hematuria, and now frequency. He has bilateral CVA tenderness as well as minimal discomfort to abdomen with palpation. No peritoneal signs. Low suspicion for acute abdomen today. No nausea or vomiting. No fevers or chills. Pt temp 99.3 today without use of antipyretics. Initially mildly tachy on arrival at 106; now in the 80's while in the room. Given pt's tenderness on exam will obtain CT A/P without contrast today as well as baseline  bloodwork. 1L NS bolus as well as toradol for pain. Will reevaluate once labs and imaging return.   Nursing staff having difficulty with IV access today. Able to get bloodwork regardless. Pt has a leukocytosis of 16,000 without any electrolyte abnormalities. Creatinine within normal limits. U/A does appear infectious as well. Given flank pain as well as UTI and white count concern for pyelo at this time. Will give IM ceftriaxone in the ED today given IV access issues.   CT scan with 1 mm nonobstructing stone without findings of hydro; no concern for infected stone at this time given no hydronephrosis. Will discharge patient at this time with oral abx for pyelo and advised to take Ibuprofen as needed for pain. Referral to urology given as well. Strict return precautions discussed with patient. He is in agreement with plans at this time and stable for discharge home.        Final Clinical Impressions(s) / ED Diagnoses   Final diagnoses:  Pyelonephritis  Kidney stone    ED Discharge Orders         Ordered    ciprofloxacin (CIPRO) 500 MG tablet  2 times daily     09/06/18 1100           Eustaquio Maize, PA-C 09/06/18 1816    Hayden Rasmussen, MD 09/07/18 1024

## 2018-09-06 NOTE — Discharge Instructions (Signed)
You were seen in the ED today for flank pain and urinary symptoms Your urine looks infectious and you were also found to have a kidney stone with CT scan although it is very small Please take antibiotics as prescribed and finish entire course You may take Ibuprofen and Tylenol as needed for the pain You can follow up with [facility] as needed Return to the ED for any worsening symptoms

## 2018-09-06 NOTE — ED Triage Notes (Signed)
Pt c/o lower back pain that started yesterday. This morning pt noted blood in his urine, along with urinary frequency. Hx kidney stones.

## 2018-09-06 NOTE — ED Notes (Signed)
2 RN attempted IV unsuccessful can give IM Ketorlac

## 2018-09-06 NOTE — ED Notes (Signed)
PA is aware patient does not have IV.Marland Kitchen Advised patient waiting for IV team so he can receive his antibiotic.

## 2018-12-08 ENCOUNTER — Emergency Department (HOSPITAL_COMMUNITY)
Admission: EM | Admit: 2018-12-08 | Discharge: 2018-12-08 | Disposition: A | Discharge status: Home / Self Care | Payer: Self-pay | Attending: Emergency Medicine | Admitting: Emergency Medicine

## 2018-12-08 ENCOUNTER — Other Ambulatory Visit: Payer: Self-pay

## 2018-12-08 ENCOUNTER — Emergency Department (HOSPITAL_COMMUNITY): Payer: Self-pay

## 2018-12-08 ENCOUNTER — Encounter (HOSPITAL_COMMUNITY): Payer: Self-pay

## 2018-12-08 DIAGNOSIS — Z720 Tobacco use: Secondary | ICD-10-CM | POA: Insufficient documentation

## 2018-12-08 DIAGNOSIS — Y939 Activity, unspecified: Secondary | ICD-10-CM | POA: Insufficient documentation

## 2018-12-08 DIAGNOSIS — M25571 Pain in right ankle and joints of right foot: Secondary | ICD-10-CM | POA: Insufficient documentation

## 2018-12-08 DIAGNOSIS — I10 Essential (primary) hypertension: Secondary | ICD-10-CM | POA: Insufficient documentation

## 2018-12-08 DIAGNOSIS — X500XXA Overexertion from strenuous movement or load, initial encounter: Secondary | ICD-10-CM | POA: Insufficient documentation

## 2018-12-08 DIAGNOSIS — Y9289 Other specified places as the place of occurrence of the external cause: Secondary | ICD-10-CM | POA: Insufficient documentation

## 2018-12-08 DIAGNOSIS — Z79899 Other long term (current) drug therapy: Secondary | ICD-10-CM | POA: Insufficient documentation

## 2018-12-08 DIAGNOSIS — Y990 Civilian activity done for income or pay: Secondary | ICD-10-CM | POA: Insufficient documentation

## 2018-12-08 MED ORDER — HYDROCODONE-ACETAMINOPHEN 5-325 MG PO TABS
1.0000 | ORAL_TABLET | Freq: Once | ORAL | Status: AC
  Administered 2018-12-08: 21:00:00 1 via ORAL
  Filled 2018-12-08: qty 1

## 2018-12-08 NOTE — ED Provider Notes (Signed)
[facility] EMERGENCY DEPARTMENT Provider Note   CSN: [id number] Arrival date & time: 12/08/18  1740     History   Chief Complaint Chief Complaint  Patient presents with  . Ankle Pain    HPI Richard Ramirez is a 33 y.o. male who presents for evaluation of ankle pain.  Patient states he was at work when he tripped inverting his right ankle.  Patient noticed swelling to his lateral malleolus on his right lower extremity.  He was able to walk after the incident.  He denies any radiation of pain, redness or warmth.  Admits to pain which he rates a 6/10.  Pain worse when he inverts his left ankle.  Denies additional aggravating or alleviating factors.  Denies hitting his head, LOC.  No fever, chills, nausea, vomiting, decreased range of motion, numbness or tingling, redness, warmth to extremities.  History obtained from patient and past medical records.  No interpreter is used.     HPI  Past Medical History:  Diagnosis Date  . Bell's palsy 01/2015  . Hypertension   . Reflux     Patient Active Problem List   Diagnosis Date Noted  . Tonsillar abscess 03/14/2016  . Strep pharyngitis   . Weakness 02/13/2015  . 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 FINGER SURGERY Right 2002   R small finger  . WRIST SURGERY Left 2010        Home Medications    Prior to Admission medications   Medication Sig Start Date End Date Taking? Authorizing Provider  hydrOXYzine (ATARAX/VISTARIL) 25 MG tablet Take 1 tablet (25 mg total) by mouth every 6 (six) hours. 05/27/18   McDonald, Mia A, PA-C  lisinopril (PRINIVIL,ZESTRIL) 5 MG tablet Take 1 tablet (5 mg total) by mouth daily. 11/29/17   Orpah Greek, MD  omeprazole (PRILOSEC) 20 MG capsule Take 1 capsule (20 mg total) by mouth daily for 14 days. 05/27/18 06/10/18  McDonald, Laymond Purser, PA-C    Family History Family History  Problem Relation Age of Onset  . Hyperlipidemia  Mother     Social History Social History   Tobacco Use  . Smoking status: Current Some Day Smoker    Types: Cigarettes  . Smokeless tobacco: Never Used  Substance Use Topics  . Alcohol use: Yes    Comment: occasional  . Drug use: No     Allergies   Chlorhexidine   Review of Systems Review of Systems  Constitutional: Negative.   HENT: Negative.   Respiratory: Negative.   Cardiovascular: Negative.   Gastrointestinal: Negative.   Genitourinary: Negative.   Musculoskeletal: Negative for gait problem.       Right ankle pain  Skin: Negative.   Neurological: Negative.   All other systems reviewed and are negative.    Physical Exam Updated Vital Signs BP 130/86 (BP Location: Right Arm)   Pulse (!) 101   Temp 98.4 F (36.9 C) (Oral)   Resp 18   SpO2 96%   Physical Exam Vitals signs and nursing note reviewed.  Constitutional:      General: He is not in acute distress.    Appearance: He is well-developed. He is not ill-appearing, toxic-appearing or diaphoretic.  HENT:     Head: Atraumatic.     Nose: Nose normal.     Mouth/Throat:     Mouth: Mucous membranes are moist.     Pharynx: Oropharynx is clear.  Eyes:  Pupils: Pupils are equal, round, and reactive to light.  Neck:     Musculoskeletal: Normal range of motion and neck supple.  Cardiovascular:     Rate and Rhythm: Normal rate and regular rhythm.     Pulses: Normal pulses.          Dorsalis pedis pulses are 2+ on the right side and 2+ on the left side.       Posterior tibial pulses are 2+ on the right side and 2+ on the left side.     Heart sounds: Normal heart sounds.  Pulmonary:     Effort: Pulmonary effort is normal. No respiratory distress.     Breath sounds: Normal breath sounds.  Abdominal:     General: Bowel sounds are normal. There is no distension.     Palpations: Abdomen is soft.  Musculoskeletal: Normal range of motion.     Right knee: Normal.     Right ankle: He exhibits swelling. He  exhibits normal range of motion, no ecchymosis, no deformity, no laceration and normal pulse. Tenderness. Lateral malleolus and AITFL tenderness found. No medial malleolus, no CF ligament, no posterior TFL and no head of 5th metatarsal tenderness found.     Left ankle: Normal.     Lumbar back: Normal.     Right lower leg: Normal.     Right foot: Normal.     Left foot: Normal.       Feet:     Comments: Tenderness palpation over right lateral malleolus and over ATFL.  There is mild soft tissue swelling to his lateral malleolus.  Full range of motion with flexion, extension.  There is pain with inversion to his right ankle.  No bony tenderness to foot, proximal, midshaft to tibia/fibula.  No bony tenderness to knee.  He is ambulatory without difficulty.  Compartments soft.  Feet:     Right foot:     Skin integrity: Skin integrity normal.     Left foot:     Skin integrity: Skin integrity normal.  Skin:    General: Skin is warm and dry.     Comments: Brisk capillary refill.  No rashes or lesions.  Soft tissue swelling to right lateral malleolus.  No contusions or abrasions.  Neurological:     Mental Status: He is alert.     Comments: Intact sensation.  Ambulatory that difficulty.      ED Treatments / Results  Labs (all labs ordered are listed, but only abnormal results are displayed) Labs Reviewed - No data to display  EKG None  Radiology Dg Ankle Complete Right  Result Date: 12/08/2018 CLINICAL DATA:  Fall with swelling EXAM: RIGHT ANKLE - COMPLETE 3+ VIEW COMPARISON:  None. FINDINGS: No fracture or malalignment. Lateral soft tissue swelling. Ankle mortise is symmetric. IMPRESSION: No acute osseous abnormality Electronically Signed   By: Jasmine Pang M.D.   On: 12/08/2018 19:04    Procedures .Ortho Injury Treatment  Date/Time: 12/08/2018 8:46 PM Performed by: Ralph Leyden A, PA-C Authorized by: Linwood Dibbles, PA-C   Consent:    Consent obtained:  Verbal   Consent  given by:  Patient   Risks discussed:  Fracture, nerve damage, restricted joint movement, vascular damage, irreducible dislocation, recurrent dislocation and stiffness   Alternatives discussed:  No treatment, immobilization, referral, alternative treatment and delayed treatmentInjury location: ankle Location details: right ankle Injury type: soft tissue Pre-procedure neurovascular assessment: neurovascularly intact Pre-procedure distal perfusion: normal Pre-procedure neurological function: normal Pre-procedure range of motion:  normal  Anesthesia: Local anesthesia used: no  Patient sedated: NoImmobilization: splint Splint type: ankle stirrup Post-procedure neurovascular assessment: post-procedure neurovascularly intact Post-procedure distal perfusion: normal Post-procedure neurological function: normal Post-procedure range of motion: normal Patient tolerance: patient tolerated the procedure well with no immediate complications    (including critical care time)  Medications Ordered in ED Medications  HYDROcodone-acetaminophen (NORCO/VICODIN) 5-325 MG per tablet 1 tablet (1 tablet Oral Given 12/08/18 2037)     Initial Impression / Assessment and Plan / ED Course  I have reviewed the triage vital signs and the nursing notes.  Pertinent labs & imaging results that were available during my care of the patient were reviewed by me and considered in my medical decision making (see chart for details).  33 year old presents for evaluation of ankle pain after inverting his right ankle.  He has soft tissue swelling and tenderness over his lateral malleolus and ATFL on his right ankle.  He is ambulatory without difficulty.  He is neurovascularly intact.  Compartments soft.  No bony tenderness to foot, proximal, midshaft to his tibia/fibula.  No tenderness to knee.  Pain film does not show evidence of fracture, dislocation or effusion.  Likely sprain or strain.  No evidence of DVT on exam or  compartment syndrome.  Will DC home in [place], RICE for symptomatic management.  Patient to follow-up with orthopedics if he has any continued symptoms.  The patient has been appropriately medically screened and/or stabilized in the ED. I have low suspicion for any other emergent medical condition which would require further screening, evaluation or treatment in the ED or require inpatient management.         Final Clinical Impressions(s) / ED Diagnoses   Final diagnoses:  Acute right ankle pain    ED Discharge Orders    None       Briya Lookabaugh A, PA-C 12/08/18 2048    Virgina Norfolkuratolo, Adam, DO 12/09/18 0123

## 2018-12-08 NOTE — ED Triage Notes (Signed)
Pt reports tripping, twisting his right ankle, swelling noted. Pt given ice pack in triage

## 2018-12-08 NOTE — Discharge Instructions (Signed)
Sure to ice, elevate your ankle.  You may take Tylenol ibuprofen as needed for pain.  Do not take more than 4000 mg of Tylenol or more than 2400 mg ibuprofen daily.  Follow-up with orthopedics if you have continued pain.

## 2019-06-24 ENCOUNTER — Ambulatory Visit (INDEPENDENT_AMBULATORY_CARE_PROVIDER_SITE_OTHER): Payer: Self-pay | Admitting: Podiatry

## 2019-06-24 DIAGNOSIS — Z5329 Procedure and treatment not carried out because of patient's decision for other reasons: Secondary | ICD-10-CM

## 2019-06-24 NOTE — Progress Notes (Signed)
No show for appt. 

## 2019-06-28 ENCOUNTER — Ambulatory Visit (INDEPENDENT_AMBULATORY_CARE_PROVIDER_SITE_OTHER): Payer: PRIVATE HEALTH INSURANCE

## 2019-06-28 ENCOUNTER — Other Ambulatory Visit: Payer: Self-pay

## 2019-06-28 ENCOUNTER — Ambulatory Visit (HOSPITAL_COMMUNITY)
Admission: EM | Admit: 2019-06-28 | Discharge: 2019-06-28 | Disposition: A | Discharge status: Home / Self Care | Payer: PRIVATE HEALTH INSURANCE | Attending: Internal Medicine | Admitting: Internal Medicine

## 2019-06-28 DIAGNOSIS — S6722XA Crushing injury of left hand, initial encounter: Secondary | ICD-10-CM | POA: Diagnosis not present

## 2019-06-28 DIAGNOSIS — Z23 Encounter for immunization: Secondary | ICD-10-CM | POA: Diagnosis not present

## 2019-06-28 DIAGNOSIS — M79642 Pain in left hand: Secondary | ICD-10-CM

## 2019-06-28 DIAGNOSIS — S6000XA Contusion of unspecified finger without damage to nail, initial encounter: Secondary | ICD-10-CM

## 2019-06-28 MED ORDER — KETOROLAC TROMETHAMINE 60 MG/2ML IM SOLN
60.0000 mg | Freq: Once | INTRAMUSCULAR | Status: AC
  Administered 2019-06-28: 19:00:00 60 mg via INTRAMUSCULAR

## 2019-06-28 MED ORDER — HYDROCODONE-ACETAMINOPHEN 5-325 MG PO TABS: 1.0000 | ORAL_TABLET | Freq: Four times a day (QID) | ORAL | 0 refills | Status: AC | PRN

## 2019-06-28 MED ORDER — IBUPROFEN 600 MG PO TABS: 600.0000 mg | ORAL_TABLET | Freq: Four times a day (QID) | ORAL | 0 refills | Status: DC | PRN

## 2019-06-28 MED ORDER — BACITRACIN ZINC 500 UNIT/GM EX OINT: 1.0000 "application " | TOPICAL_OINTMENT | Freq: Two times a day (BID) | CUTANEOUS | 0 refills | Status: AC

## 2019-06-28 MED ORDER — KETOROLAC TROMETHAMINE 60 MG/2ML IM SOLN
INTRAMUSCULAR | Status: AC
  Filled 2019-06-28: qty 2

## 2019-06-28 MED ORDER — TETANUS-DIPHTH-ACELL PERTUSSIS 5-2.5-18.5 LF-MCG/0.5 IM SUSP
0.5000 mL | Freq: Once | INTRAMUSCULAR | Status: AC
  Administered 2019-06-28: 19:00:00 0.5 mL via INTRAMUSCULAR

## 2019-06-28 MED ORDER — TETANUS-DIPHTH-ACELL PERTUSSIS 5-2.5-18.5 LF-MCG/0.5 IM SUSP
INTRAMUSCULAR | Status: AC
  Filled 2019-06-28: qty 0.5

## 2019-06-28 NOTE — ED Provider Notes (Addendum)
[facility]    CSN: [id number] Arrival date & time: 06/28/19  1729      History   Chief Complaint Chief Complaint  Patient presents with  . Laceration    HPI Richard Ramirez is a 34 y.o. male comes to the urgent care with left hand pain after his hand was caught and pinned between 2 trailers.  Patient's pain is severe-currently 10/10, aggravated by palpation and moving her hand.  He has not tried any over-the-counter medication.  He denies any numbness or tingling.  He has some swelling in the left index finger.  He also has some superficial abrasions on the dorsum of the left finger as well as the webspace between the index and middle fingers.  He does not remember last Tdap vaccination.   HPI  Past Medical History:  Diagnosis Date  . Bell's palsy 01/2015  . Hypertension   . Reflux     Patient Active Problem List   Diagnosis Date Noted  . Tonsillar abscess 03/14/2016  . Strep pharyngitis   . Weakness 02/13/2015  . 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 FINGER SURGERY Right 2002   R small finger  . WRIST SURGERY Left 2010       Home Medications    Prior to Admission medications   Medication Sig Start Date End Date Taking? Authorizing Provider  hydrOXYzine (ATARAX/VISTARIL) 25 MG tablet Take 1 tablet (25 mg total) by mouth every 6 (six) hours. 05/27/18  Yes McDonald, Mia A, PA-C  lisinopril (PRINIVIL,ZESTRIL) 5 MG tablet Take 1 tablet (5 mg total) by mouth daily. 11/29/17  Yes Pollina, Gwenyth Allegra, MD  bacitracin ointment Apply 1 application topically 2 (two) times daily. 06/28/19   Deysi Soldo, Myrene Galas, MD  HYDROcodone-acetaminophen (NORCO/VICODIN) 5-325 MG tablet Take 1 tablet by mouth every 6 (six) hours as needed for up to 5 days for moderate pain. 06/28/19 07/03/19  Chase Picket, MD  ibuprofen (ADVIL) 600 MG tablet Take 1 tablet (600 mg total) by mouth every 6 (six) hours as needed. 06/28/19   Mikita Lesmeister, Myrene Galas, MD  omeprazole (PRILOSEC) 20 MG capsule Take 1 capsule (20 mg total) by mouth daily for 14 days. 05/27/18 06/28/19  McDonald, Laymond Purser, PA-C    Family History Family History  Problem Relation Age of Onset  . Hyperlipidemia Mother     Social History Social History   Tobacco Use  . Smoking status: Current Some Day Smoker    Types: Cigarettes  . Smokeless tobacco: Never Used  Substance Use Topics  . Alcohol use: Yes    Comment: occasional  . Drug use: No     Allergies   Chlorhexidine   Review of Systems Review of Systems  HENT: Negative.   Gastrointestinal: Negative.   Genitourinary: Negative.   Musculoskeletal: Positive for arthralgias and joint swelling. Negative for back pain and myalgias.  Skin: Positive for color change and wound. Negative for rash.  Neurological: Negative.      Physical Exam Triage Vital Signs ED Triage Vitals  Enc Vitals Group     BP 06/28/19 1840 132/88     Pulse Rate 06/28/19 1840 82     Resp 06/28/19 1840 18     Temp 06/28/19 1840 97.9 F (36.6 C)     Temp Source 06/28/19 1840 Oral     SpO2 06/28/19 1840 98 %     Weight --      Height --  Head Circumference --      Peak Flow --      Pain Score 06/28/19 1838 8     Pain Loc --      Pain Edu? --      Excl. in GC? --    No data found.  Updated Vital Signs BP 132/88 (BP Location: Right Arm)   Pulse 82   Temp 97.9 F (36.6 C) (Oral)   Resp 18   SpO2 98%   Visual Acuity Right Eye Distance:   Left Eye Distance:   Bilateral Distance:    Right Eye Near:   Left Eye Near:    Bilateral Near:     Physical Exam Vitals and nursing note reviewed.  Constitutional:      General: He is in acute distress.     Appearance: He is not ill-appearing.  Pulmonary:     Effort: Pulmonary effort is normal.     Breath sounds: Normal breath sounds.  Abdominal:     General: Bowel sounds are normal.     Palpations: Abdomen is soft.  Musculoskeletal:     Comments: Limited range of motion  in the left index finger at the proximal interphalangeal joint as well as the second metacarpophalangeal joint.  Abrasion over the dorsum of the left index finger as well as the webspace between the second and third fingers on the left  Skin:    General: Skin is warm.     Capillary Refill: Capillary refill takes less than 2 seconds.     Findings: No bruising or erythema.     Comments: Swelling and mild bruising over the dorsum of the left hand.  Neurological:     General: No focal deficit present.     Mental Status: He is alert and oriented to person, place, and time.      UC Treatments / Results  Labs (all labs ordered are listed, but only abnormal results are displayed) Labs Reviewed - No data to display  EKG   Radiology DG Hand Complete Left  Result Date: 06/28/2019 CLINICAL DATA:  Crush injury, left hand pain EXAM: LEFT HAND - COMPLETE 3+ VIEW COMPARISON:  06/18/2013 FINDINGS: Frontal, oblique, lateral views of the left hand demonstrate no acute displaced fractures. Alignment is anatomic. Joint spaces are well preserved. Soft tissues are normal. IMPRESSION: 1. Unremarkable left hand. Electronically Signed   By: Sharlet Salina M.D.   On: 06/28/2019 19:21    Procedures Procedures (including critical care time)  Medications Ordered in UC Medications  Tdap (BOOSTRIX) injection 0.5 mL (0.5 mLs Intramuscular Given 06/28/19 1905)  ketorolac (TORADOL) injection 60 mg (60 mg Intramuscular Given 06/28/19 1906)    Initial Impression / Assessment and Plan / UC Course  I have reviewed the triage vital signs and the nursing notes.  Pertinent labs & imaging results that were available during my care of the patient were reviewed by me and considered in my medical decision making (see chart for details).    1.  Contusion of the left index finger: X-ray of the left hand is negative for acute fracture Toradol 60 mg IM x1 dose given Daily wound dressing changes Hydrocodone/acetaminophen 1  tablet every 6 hours as needed for pain Ibuprofen 600 mg every 6 hours as needed for pain Return precautions given Tdap given Bacitracin ointment for daily wound dressing changes  Final Clinical Impressions(s) / UC Diagnoses   Final diagnoses:  Contusion of finger of right hand, initial encounter     Discharge  Instructions     Daily wound dressing changes Apply topical antibiotic ointment to be superficial abrasions If you notice worsening swelling, inability to bend your finger, redness, increasing pain return to the urgent care to be reevaluated.   ED Prescriptions    Medication Sig Dispense Auth. Provider   HYDROcodone-acetaminophen (NORCO/VICODIN) 5-325 MG tablet Take 1 tablet by mouth every 6 (six) hours as needed for up to 5 days for moderate pain. 15 tablet Connelly Spruell, Britta Mccreedy, MD   ibuprofen (ADVIL) 600 MG tablet Take 1 tablet (600 mg total) by mouth every 6 (six) hours as needed. 30 tablet Kaelin Bonelli, Britta Mccreedy, MD   bacitracin ointment Apply 1 application topically 2 (two) times daily. 120 g Kriss Ishler, Britta Mccreedy, MD     I have reviewed the PDMP during this encounter.   Merrilee Jansky, MD 06/28/19 1950    Merrilee Jansky, MD 06/28/19 1950

## 2019-06-28 NOTE — ED Triage Notes (Addendum)
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 Pt report hand got caught/pinned between two trailers. approx 1 cm long lac to dorsal area of left hand/2nd finger area and small lac to webbing between 2nd/3rd fingers.  Also c/o intermittent pain to left hip/leg onset several weeks ago. Uncertain of last tetanus booster.

## 2019-06-28 NOTE — Discharge Instructions (Signed)
Daily wound dressing changes Apply topical antibiotic ointment to be superficial abrasions If you notice worsening swelling, inability to bend your finger, redness, increasing pain return to the urgent care to be reevaluated.

## 2019-09-20 ENCOUNTER — Emergency Department (HOSPITAL_COMMUNITY): Payer: No Typology Code available for payment source

## 2019-09-20 ENCOUNTER — Emergency Department (HOSPITAL_COMMUNITY)
Admission: EM | Admit: 2019-09-20 | Discharge: 2019-09-20 | Disposition: A | Discharge status: Home / Self Care | Payer: No Typology Code available for payment source | Attending: Emergency Medicine | Admitting: Emergency Medicine

## 2019-09-20 DIAGNOSIS — R1013 Epigastric pain: Secondary | ICD-10-CM | POA: Diagnosis present

## 2019-09-20 DIAGNOSIS — K219 Gastro-esophageal reflux disease without esophagitis: Secondary | ICD-10-CM | POA: Diagnosis not present

## 2019-09-20 DIAGNOSIS — I10 Essential (primary) hypertension: Secondary | ICD-10-CM | POA: Insufficient documentation

## 2019-09-20 DIAGNOSIS — R6883 Chills (without fever): Secondary | ICD-10-CM | POA: Diagnosis not present

## 2019-09-20 DIAGNOSIS — R112 Nausea with vomiting, unspecified: Secondary | ICD-10-CM | POA: Insufficient documentation

## 2019-09-20 DIAGNOSIS — F17210 Nicotine dependence, cigarettes, uncomplicated: Secondary | ICD-10-CM | POA: Diagnosis not present

## 2019-09-20 DIAGNOSIS — R0789 Other chest pain: Secondary | ICD-10-CM | POA: Insufficient documentation

## 2019-09-20 DIAGNOSIS — R079 Chest pain, unspecified: Secondary | ICD-10-CM

## 2019-09-20 DIAGNOSIS — Z79899 Other long term (current) drug therapy: Secondary | ICD-10-CM | POA: Diagnosis not present

## 2019-09-20 LAB — CBC
HCT: 41.4 % (ref 39.0–52.0)
Hemoglobin: 13.3 g/dL (ref 13.0–17.0)
MCH: 28.5 pg (ref 26.0–34.0)
MCHC: 32.1 g/dL (ref 30.0–36.0)
MCV: 88.8 fL (ref 80.0–100.0)
Platelets: 226 10*3/uL (ref 150–400)
RBC: 4.66 MIL/uL (ref 4.22–5.81)
RDW: 12.7 % (ref 11.5–15.5)
WBC: 8.5 10*3/uL (ref 4.0–10.5)
nRBC: 0 % (ref 0.0–0.2)

## 2019-09-20 LAB — BASIC METABOLIC PANEL
Anion gap: 9 (ref 5–15)
BUN: 21 mg/dL — ABNORMAL HIGH (ref 6–20)
CO2: 24 mmol/L (ref 22–32)
Calcium: 9 mg/dL (ref 8.9–10.3)
Chloride: 107 mmol/L (ref 98–111)
Creatinine, Ser: 1.17 mg/dL (ref 0.61–1.24)
GFR calc Af Amer: >60 mL/min (ref >60)
GFR calc non Af Amer: >60 mL/min (ref >60)
Glucose, Bld: 93 mg/dL (ref 70–99)
Potassium: 3.6 mmol/L (ref 3.5–5.1)
Sodium: 140 mmol/L (ref 135–145)

## 2019-09-20 LAB — TROPONIN I (HIGH SENSITIVITY)
Troponin I (High Sensitivity): 2 ng/L (ref <18)
Troponin I (High Sensitivity): 3 ng/L (ref <18)

## 2019-09-20 MED ORDER — ACETAMINOPHEN 500 MG PO TABS
1000.0000 mg | ORAL_TABLET | Freq: Once | ORAL | Status: AC
  Administered 2019-09-20: 1000 mg via ORAL
  Filled 2019-09-20: qty 2

## 2019-09-20 MED ORDER — SODIUM CHLORIDE 0.9% FLUSH: 3.0000 mL | Freq: Once | INTRAVENOUS | Status: DC

## 2019-09-20 MED ORDER — FAMOTIDINE 20 MG PO TABS
20.0000 mg | ORAL_TABLET | Freq: Once | ORAL | Status: AC
  Administered 2019-09-20: 20 mg via ORAL
  Filled 2019-09-20: qty 1

## 2019-09-20 MED ORDER — ALUM & MAG HYDROXIDE-SIMETH 200-200-20 MG/5ML PO SUSP
30.0000 mL | Freq: Once | ORAL | Status: AC
  Administered 2019-09-20: 30 mL via ORAL
  Filled 2019-09-20: qty 30

## 2019-09-20 NOTE — ED Triage Notes (Signed)
BIB EMS from work. Patient reports sudden onset CP PTA. L sided, radiation down L arm. Also vomited X1 prior to EMS arrival. Given 324 ASA, 2NTG en route. VSS.

## 2019-09-20 NOTE — ED Notes (Signed)
Patient c/o epigastric pain onset yest approx. 5 pm after eating chicken wings, states lately this had been happening after eating greasy or spicy foods, states he had several episodes of vomiting.

## 2019-09-20 NOTE — ED Provider Notes (Signed)
[facility] EMERGENCY DEPARTMENT Provider Note   CSN: [id number] Arrival date & time: 09/20/19  0119     History Chief Complaint  Patient presents with  . Chest Pain    Richard Ramirez is a 34 y.o. male with pmhx of GERD, hypertension who presents to ED with c/o chest pain that started yesterday at 1800.  Patient reports that he had just eaten chicken wings 2 hours prior to chest pain starting.  The pain is located over his left breast.  He describes it as a sharp pain.  He also reports some epigastric pain as well as some nausea vomiting.  Patient reports that he has been vomiting after each meal for the last month.  He follows with his primary care physician for this and has been prescribed omeprazole which she reports taking daily.  He reports that the GERD previously occurred with spicy foods but now it is all foods.  Patient denies any shortness of breath, headache, recent traumas, midsternal pressure.     Past Medical History:  Diagnosis Date  . Bell's palsy 01/2015  . Hypertension   . Reflux     Patient Active Problem List   Diagnosis Date Noted  . Tonsillar abscess 03/14/2016  . Strep pharyngitis   . Weakness 02/13/2015  . 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 FINGER SURGERY Right 2002   R small finger  . WRIST SURGERY Left 2010       Family History  Problem Relation Age of Onset  . Hyperlipidemia Mother     Social History   Tobacco Use  . Smoking status: Current Some Day Smoker    Types: Cigarettes  . Smokeless tobacco: Never Used  Substance Use Topics  . Alcohol use: Yes    Comment: occasional  . Drug use: No    Home Medications Prior to Admission medications   Medication Sig Start Date End Date Taking? Authorizing Provider  gabapentin (NEURONTIN) 300 MG capsule Take 300 mg by mouth at bedtime. 08/26/19  Yes [provider]  hydrochlorothiazide (MICROZIDE) 12.5 MG capsule Take  12.5 mg by mouth every morning. 08/26/19  Yes [provider]  ibuprofen (ADVIL) 200 MG tablet Take 400 mg by mouth every 6 (six) hours as needed for fever, headache or moderate pain.   Yes [provider]  lisinopril (PRINIVIL,ZESTRIL) 5 MG tablet Take 1 tablet (5 mg total) by mouth daily. 11/29/17  Yes Pollina, Canary Brim, MD  omeprazole (PRILOSEC) 20 MG capsule Take 20 mg by mouth every morning. 08/26/19  Yes [provider]  topiramate (TOPAMAX) 25 MG tablet Take 25 mg by mouth daily. 08/26/19  Yes [provider]  bacitracin ointment Apply 1 application topically 2 (two) times daily. Patient not taking: Reported on 09/20/2019 06/28/19   Merrilee Jansky, MD  hydrOXYzine (ATARAX/VISTARIL) 25 MG tablet Take 1 tablet (25 mg total) by mouth every 6 (six) hours. Patient not taking: Reported on 09/20/2019 05/27/18   McDonald, Mia A, PA-C  ibuprofen (ADVIL) 600 MG tablet Take 1 tablet (600 mg total) by mouth every 6 (six) hours as needed. Patient not taking: Reported on 09/20/2019 06/28/19   Merrilee Jansky, MD    Allergies    Chlorhexidine  Review of Systems   Review of Systems  Constitutional: Positive for chills. Negative for fever.  HENT: Negative for sore throat.   Eyes: Negative for pain.  Respiratory: Negative for apnea, cough, choking and  shortness of breath.   Cardiovascular: Positive for chest pain. Negative for leg swelling.  Gastrointestinal: Positive for abdominal pain, nausea and vomiting. Negative for abdominal distention.  Genitourinary: Negative for difficulty urinating.  Musculoskeletal: Negative.   Skin: Negative.   Allergic/Immunologic: Negative.   Neurological: Negative.   Hematological: Negative.   Psychiatric/Behavioral: Negative.     Physical Exam Updated Vital Signs BP (!) 132/89 (BP Location: Left Arm)   Pulse 64   Temp 97.9 F (36.6 C)   Resp 18   Ht 6\' 3"  (1.905 m)   Wt (!) 147.4 kg   SpO2 99%   BMI 40.62 kg/m    Physical Exam  ED Results / Procedures / Treatments   Labs (all labs ordered are listed, but only abnormal results are displayed) Labs Reviewed  BASIC METABOLIC PANEL - Abnormal; Notable for the following components:      Result Value   BUN 21 (*)    All other components within normal limits  CBC  TROPONIN I (HIGH SENSITIVITY)  TROPONIN I (HIGH SENSITIVITY)    EKG EKG Interpretation  Date/Time:  Monday September 20 2019 01:24:41 EDT Ventricular Rate:  91 PR Interval:  146 QRS Duration: 92 QT Interval:  364 QTC Calculation: 447 R Axis:   -45 Text Interpretation: Normal sinus rhythm Left anterior fascicular block No significant change since last tracing Confirmed by [id number] (Cathren Laine) on 09/20/2019 10:47:20 AM   Radiology DG Chest 2 View  Result Date: 09/20/2019 CLINICAL DATA:  Chest pain EXAM: CHEST - 2 VIEW COMPARISON:  August 14, 2017 FINDINGS: The heart size and mediastinal contours are within normal limits. Both lungs are clear. The visualized skeletal structures are unremarkable. IMPRESSION: No active cardiopulmonary disease. Electronically Signed   By: August 16, 2017 M.D.   On: 09/20/2019 02:00   Procedures Procedures (including critical care time)  Medications Ordered in ED Medications  sodium chloride flush (NS) 0.9 % injection 3 mL (has no administration in time range)  famotidine (PEPCID) tablet 20 mg (20 mg Oral Given 09/20/19 1135)  alum & mag hydroxide-simeth (MAALOX/MYLANTA) 200-200-20 MG/5ML suspension 30 mL (30 mLs Oral Given 09/20/19 1135)  acetaminophen (TYLENOL) tablet 1,000 mg (1,000 mg Oral Given 09/20/19 1135)    ED Course  I have reviewed the triage vital signs and the nursing notes.  Pertinent labs & imaging results that were available during my care of the patient were reviewed by me and considered in my medical decision making (see chart for details).    MDM Rules/Calculators/A&P                          34 year old male presenting with post  prandial left sided chest pain. His pmhx is s/f hypertension and poorly controlled GERD. His vital signs are stable and significant for mild hypertension and obesity. His physical exam is s/f TTP of left chest. Labs reassuring. Troponins negative, BMP and CBC wnl. This is likely MSK vs GERD. Will provide acetaminophen, maalox and famotidine.   Patient should follow up with his PCP concerning his persistent GERD, but will not be able to fix this in the ED.   Patient reports some improvement. He is sleeping on the bed comfortably and would like to go home. Will discharge home with PCP follow up for GERD. Work note also attached with discharge summary   Final Clinical Impression(s) / ED Diagnoses Final diagnoses:  Chest pain, unspecified type  Gastroesophageal reflux disease, unspecified whether  esophagitis present    Rx / DC Orders ED Discharge Orders    None     Melene Plan, M.D.  1:12 PM 09/20/2019     Melene Plan, MD 09/20/19 1312    Cathren Laine, MD 09/23/19 1057

## 2019-09-20 NOTE — ED Notes (Signed)
States he is feeling a little better.

## 2019-11-17 ENCOUNTER — Other Ambulatory Visit: Payer: Self-pay | Admitting: Family Medicine

## 2019-11-17 DIAGNOSIS — A0811 Acute gastroenteropathy due to Norwalk agent: Secondary | ICD-10-CM

## 2019-11-17 DIAGNOSIS — R110 Nausea: Secondary | ICD-10-CM

## 2019-11-17 DIAGNOSIS — R109 Unspecified abdominal pain: Secondary | ICD-10-CM

## 2019-11-24 ENCOUNTER — Other Ambulatory Visit: Payer: Self-pay | Admitting: Family Medicine

## 2019-11-26 ENCOUNTER — Other Ambulatory Visit: Payer: Self-pay | Admitting: Family Medicine

## 2019-11-29 ENCOUNTER — Other Ambulatory Visit: Payer: PRIVATE HEALTH INSURANCE

## 2019-12-21 ENCOUNTER — Ambulatory Visit: Payer: PRIVATE HEALTH INSURANCE | Admitting: Gastroenterology

## 2021-08-07 ENCOUNTER — Emergency Department (HOSPITAL_COMMUNITY)
Admission: EM | Admit: 2021-08-07 | Discharge: 2021-08-07 | Disposition: A | Discharge status: Home / Self Care | Payer: No Typology Code available for payment source | Attending: Emergency Medicine | Admitting: Emergency Medicine

## 2021-08-07 ENCOUNTER — Other Ambulatory Visit: Payer: Self-pay

## 2021-08-07 ENCOUNTER — Encounter (HOSPITAL_COMMUNITY): Payer: Self-pay

## 2021-08-07 DIAGNOSIS — Y9389 Activity, other specified: Secondary | ICD-10-CM | POA: Insufficient documentation

## 2021-08-07 DIAGNOSIS — X501XXA Overexertion from prolonged static or awkward postures, initial encounter: Secondary | ICD-10-CM | POA: Insufficient documentation

## 2021-08-07 DIAGNOSIS — M5441 Lumbago with sciatica, right side: Secondary | ICD-10-CM | POA: Insufficient documentation

## 2021-08-07 MED ORDER — OXYCODONE-ACETAMINOPHEN 5-325 MG PO TABS: 1.0000 | ORAL_TABLET | ORAL | 0 refills | Status: AC | PRN

## 2021-08-07 MED ORDER — PREDNISONE 20 MG PO TABS: ORAL_TABLET | ORAL | 0 refills | Status: AC

## 2021-08-07 MED ORDER — OXYCODONE-ACETAMINOPHEN 5-325 MG PO TABS
2.0000 | ORAL_TABLET | Freq: Once | ORAL | Status: AC
  Administered 2021-08-07: 2 via ORAL
  Filled 2021-08-07: qty 2

## 2021-08-07 MED ORDER — PREDNISONE 20 MG PO TABS
60.0000 mg | ORAL_TABLET | Freq: Once | ORAL | Status: AC
  Administered 2021-08-07: 60 mg via ORAL
  Filled 2021-08-07: qty 3

## 2021-08-07 NOTE — Discharge Instructions (Signed)
Take the prescribed medication as directed.  You have have today's prednisone already, start the prescription in the morning.  Use caution when taking pain medication, it can make you drowsy. Follow-up with primary care doctor. Return to the ED for new or worsening symptoms-- leg numbness, bowel/bladder incontinence, inability to walk, etc.

## 2021-08-07 NOTE — ED Provider Notes (Signed)
[facility] EMERGENCY DEPARTMENT Provider Note   CSN: [id number] Arrival date & time: 08/07/21  1405     History  Chief Complaint  Patient presents with   Back Pain    Richard Ramirez is a 36 y.o. male.  The history is provided by the patient and medical records.  Back Pain  36 y.o. M presenting to the ED for right lower back pain.  He states it began yesterday while he was working.  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  He denies any fall or direct trauma to the back.  Pain radiates from right lower back into the buttock, feels like knives stabbing him.  Reports some tingling of right foot but denies focal numbness/tingling.  No bowel or bladder incontinence.  He has been taking over-the-counter medications without much relief.  He does report prior issues with "nerve pain" in his neck, and feels similar but only in his legs this time.  Home Medications Prior to Admission medications   Medication Sig Start Date End Date Taking? Authorizing Provider  bacitracin ointment Apply 1 application topically 2 (two) times daily. Patient not taking: Reported on 09/20/2019 06/28/19   Merrilee Jansky, MD  gabapentin (NEURONTIN) 300 MG capsule Take 300 mg by mouth at bedtime. 08/26/19   [provider]  hydrochlorothiazide (MICROZIDE) 12.5 MG capsule Take 12.5 mg by mouth every morning. 08/26/19   [provider]  hydrOXYzine (ATARAX/VISTARIL) 25 MG tablet Take 1 tablet (25 mg total) by mouth every 6 (six) hours. Patient not taking: Reported on 09/20/2019 05/27/18   McDonald, Mia A, PA-C  ibuprofen (ADVIL) 200 MG tablet Take 400 mg by mouth every 6 (six) hours as needed for fever, headache or moderate pain.    [provider]  ibuprofen (ADVIL) 600 MG tablet Take 1 tablet (600 mg total) by mouth every 6 (six) hours as needed. Patient not taking: Reported on 09/20/2019 06/28/19   Merrilee Jansky, MD  lisinopril (PRINIVIL,ZESTRIL) 5 MG tablet Take 1 tablet (5 mg total) by mouth daily. 11/29/17   Gilda Crease, MD  omeprazole (PRILOSEC) 20 MG capsule Take 20 mg by mouth every morning. 08/26/19   [provider]  topiramate (TOPAMAX) 25 MG tablet Take 25 mg by mouth daily. 08/26/19   [provider]      Allergies    Chlorhexidine    Review of Systems   Review of Systems  Musculoskeletal:  Positive for back pain.  All other systems reviewed and are negative.   Physical Exam Updated Vital Signs BP 121/67 (BP Location: Right Arm)   Pulse 75   Temp 98.6 F (37 C) (Oral)   Resp 18   Ht 6\' 3"  (1.905 m)   Wt (!) 145.2 kg   SpO2 95%   BMI 40.00 kg/m   Physical Exam Vitals and nursing note reviewed.  Constitutional:      Appearance: He is well-developed.  HENT:     Head: Normocephalic and atraumatic.  Eyes:     Conjunctiva/sclera: Conjunctivae normal.     Pupils: Pupils are equal, round, and reactive to light.  Cardiovascular:     Rate and Rhythm: Normal rate and regular rhythm.     Heart sounds: Normal heart sounds.  Pulmonary:     Effort: Pulmonary effort is normal.     Breath sounds: Normal breath sounds.  Abdominal:     General: Bowel  sounds are normal.     Palpations: Abdomen is soft.  Musculoskeletal:        General: Normal range of motion.     Cervical back: Normal range of motion.     Comments: Tender along right lumbar paraspinal region without midline tenderness or step-off, + SLR on right, DP pulse intact right foot, able to plantar and dorsiflex without issue, normal sensation throughout entire right leg, denies saddle anesthesia  Skin:    General: Skin is warm and dry.  Neurological:     Mental Status: He is alert and oriented to person, place, and time.     ED Results / Procedures / Treatments   Labs (all labs ordered are listed, but only abnormal results are displayed) Labs Reviewed - No data to  display  EKG None  Radiology No results found.  Procedures Procedures    Medications Ordered in ED Medications  oxyCODONE-acetaminophen (PERCOCET/ROXICET) 5-325 MG per tablet 2 tablet (2 tablets Oral Given 08/07/21 2228)  predniSONE (DELTASONE) tablet 60 mg (60 mg Oral Given 08/07/21 2228)    ED Course/ Medical Decision Making/ A&P                           Medical Decision Making Risk Prescription drug management.   36 year old male presenting to the ED with right lower back pain for the past 24 hours.  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  Denies any fall or trauma.  Pain from right lower back into the right buttock, sharp and stabbing in nature.  Does have some tenderness to the lumbar paraspinal area on exam but no midline step-off or deformity.  Positive straight leg raise on the right.  He has no focal deficit noted on exam.  Not clinically concerning for cauda equina.  Pain seems consistent with lumbar radiculopathy, this was discussed with patient and he agrees.  Hx of similar problems in the neck which felt similar.  Will treat symptomatically.  Will need to follow-up with PCP.  Work note given.  Return here for new concerns.  Final Clinical Impression(s) / ED Diagnoses Final diagnoses:  Acute right-sided low back pain with right-sided sciatica    Rx / DC Orders ED Discharge Orders          Ordered    predniSONE (DELTASONE) 20 MG tablet        08/07/21 2233    oxyCODONE-acetaminophen (PERCOCET) 5-325 MG tablet  Every 4 hours PRN        08/07/21 2233              Garlon Hatchet, PA-C 08/07/21 2238    Gloris Manchester, MD 08/08/21 [phone]

## 2021-08-07 NOTE — ED Triage Notes (Signed)
Pt arrived POV with c/c of Back Pain. Per Pt " I have sharp pain from right lower leg to bottom of back". Pt stated it started yesterday in which he thought it was from "me working hard, but this morning I couldn't get up because it hurt to bad".

## 2021-08-07 NOTE — ED Provider Triage Note (Cosign Needed)
Emergency Medicine Provider Triage Evaluation Note  Richard Ramirez , a 36 y.o. male  was evaluated in triage.  Pt complains of back pain onset yesterday. Pt had sharp pain to the right lower back.  Denies recent fall, injury, trauma.  Notes that he was at work yesterday prior to the onset of his symptoms.  Has tried Tylenol with no relief of his symptoms. Denies bowel/bladder incontinence, numbness, tingling, weakness, saddle paresthesia.   Review of Systems  Positive: As per HPI above Negative:   Physical Exam  BP 123/79 (BP Location: Right Arm)   Pulse 88   Temp 98.6 F (37 C) (Oral)   Resp 18   Ht 6\' 3"  (1.905 m)   Wt (!) 145.2 kg   SpO2 95%   BMI 40.00 kg/m  Gen:   Awake, no distress   Resp:  Normal effort  MSK:   Moves extremities without difficulty  Other:  No spinal tenderness to palpation.  Tenderness to palpation noted to right lumbar musculoskeletal region.  Positive straight leg raise on the right.  Medical Decision Making  Medically screening exam initiated at 3:03 PM.  Appropriate orders placed.  Richard Ramirez was informed that the remainder of the evaluation will be completed by another provider, this initial triage assessment does not replace that evaluation, and the importance of remaining in the ED until their evaluation is complete.  Work-up initiated.    Towana Stenglein A, PA-C 08/07/21 [phone]

## 2021-10-11 ENCOUNTER — Ambulatory Visit (HOSPITAL_COMMUNITY)
Admission: EM | Admit: 2021-10-11 | Discharge: 2021-10-11 | Disposition: A | Discharge status: Home / Self Care | Payer: 59 | Attending: Internal Medicine | Admitting: Internal Medicine

## 2021-10-11 ENCOUNTER — Telehealth (HOSPITAL_COMMUNITY): Payer: Self-pay

## 2021-10-11 ENCOUNTER — Ambulatory Visit (INDEPENDENT_AMBULATORY_CARE_PROVIDER_SITE_OTHER): Payer: 59

## 2021-10-11 ENCOUNTER — Encounter (HOSPITAL_COMMUNITY): Payer: Self-pay

## 2021-10-11 DIAGNOSIS — M545 Low back pain, unspecified: Secondary | ICD-10-CM

## 2021-10-11 DIAGNOSIS — S39012A Strain of muscle, fascia and tendon of lower back, initial encounter: Secondary | ICD-10-CM

## 2021-10-11 MED ORDER — ACETAMINOPHEN 325 MG PO TABS
ORAL_TABLET | ORAL | Status: AC
  Filled 2021-10-11: qty 3

## 2021-10-11 MED ORDER — IBUPROFEN 600 MG PO TABS: 600.0000 mg | ORAL_TABLET | Freq: Four times a day (QID) | ORAL | 0 refills | Status: AC | PRN

## 2021-10-11 MED ORDER — TIZANIDINE HCL 4 MG PO TABS: 4.0000 mg | ORAL_TABLET | Freq: Four times a day (QID) | ORAL | 0 refills | Status: AC | PRN

## 2021-10-11 MED ORDER — KETOROLAC TROMETHAMINE 30 MG/ML IJ SOLN
INTRAMUSCULAR | Status: AC
  Filled 2021-10-11: qty 1

## 2021-10-11 MED ORDER — IBUPROFEN 600 MG PO TABS: 600.0000 mg | ORAL_TABLET | Freq: Four times a day (QID) | ORAL | 0 refills | Status: DC | PRN

## 2021-10-11 MED ORDER — KETOROLAC TROMETHAMINE 30 MG/ML IJ SOLN
30.0000 mg | Freq: Once | INTRAMUSCULAR | Status: AC
  Administered 2021-10-11: 30 mg via INTRAMUSCULAR

## 2021-10-11 MED ORDER — ACETAMINOPHEN 325 MG PO TABS
975.0000 mg | ORAL_TABLET | Freq: Once | ORAL | Status: AC
  Administered 2021-10-11: 975 mg via ORAL

## 2021-10-11 MED ORDER — TIZANIDINE HCL 4 MG PO TABS: 4.0000 mg | ORAL_TABLET | Freq: Four times a day (QID) | ORAL | 0 refills | Status: DC | PRN

## 2021-10-11 NOTE — ED Provider Notes (Signed)
[facility]    CSN: [id number] Arrival date & time: 10/11/21  1829      History   Chief Complaint Chief Complaint  Patient presents with   Back Pain    HPI Richard Ramirez is a 36 y.o. male.   Patient presents urgent care for evaluation of bilateral lower back pain after lifting an approximately 180 pound motorcycle motor up and into his motorcycle yesterday.  Patient states that after the incident, he felt numbness to his lower back and he went to work.  Patient works at [employer] and frequently has to perform physical activity where his job with some heavy lifting as well.  This morning, his pain was severe upon waking and he noticed it was very difficult for him to bend forward with normal range of motion.  During the initial injury, patient states that he heard a "pop" in his back.  Denies shooting pains down the back of his legs, urinary/stool incontinence, urinary symptoms, headache, dizziness, passing out, and saddle anesthesia symptoms.  He has never injured his back in the past.  He has not attempted use of any medications prior to arrival urgent care for symptoms.    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 Authorizing Provider  bacitracin ointment Apply 1 application topically 2 (two) times daily. Patient not taking: Reported on 09/20/2019 06/28/19   Merrilee Jansky, MD  gabapentin (NEURONTIN) 300 MG capsule Take 300 mg by mouth at bedtime. 08/26/19   [provider]  hydrochlorothiazide  (MICROZIDE) 12.5 MG capsule Take 12.5 mg by mouth every morning. 08/26/19   [provider]  hydrOXYzine (ATARAX/VISTARIL) 25 MG tablet Take 1 tablet (25 mg total) by mouth every 6 (six) hours. Patient not taking: Reported on 09/20/2019 05/27/18   McDonald, Mia A, PA-C  ibuprofen (ADVIL) 600 MG tablet Take 1 tablet (600 mg total) by mouth every 6 (six) hours as needed. 10/11/21   Carlisle Beers, FNP  lisinopril (PRINIVIL,ZESTRIL) 5 MG tablet Take 1 tablet (5 mg total) by mouth daily. 11/29/17   Gilda Crease, MD  omeprazole (PRILOSEC) 20 MG capsule Take 20 mg by mouth every morning. 08/26/19   [provider]  oxyCODONE-acetaminophen (PERCOCET) 5-325 MG tablet Take 1 tablet by mouth every 4 (four) hours as needed. 08/07/21   Garlon Hatchet, PA-C  predniSONE (DELTASONE) 20 MG tablet Take 40 mg by mouth daily for 3 days, then 20mg  by mouth daily for 3 days, then 10mg  daily for 3 days 08/07/21   Garlon Hatchet, PA-C  tiZANidine (ZANAFLEX) 4 MG tablet Take 1 tablet (4 mg total) by mouth every 6 (six) hours as needed for muscle spasms. 10/11/21   Carlisle Beers, FNP  topiramate (TOPAMAX) 25 MG tablet Take 25 mg by mouth daily. 08/26/19   [provider]    Family History Family History  Problem Relation Age of Onset   Hyperlipidemia  Mother     Social History Social History   Tobacco Use   Smoking status: Some Days    Types: Cigarettes   Smokeless tobacco: Never  Substance Use Topics   Alcohol use: Yes    Comment: occasional "Weekends"   Drug use: No     Allergies   Chlorhexidine   Review of Systems Review of Systems  Musculoskeletal:  Positive for back pain.  Per HPI   Physical Exam Triage Vital Signs ED Triage Vitals  Enc Vitals Group     BP 10/11/21 1859 123/79     Pulse Rate 10/11/21 1859 77     Resp 10/11/21 1859 12     Temp 10/11/21 1859 98.3 F (36.8 C)     Temp Source 10/11/21 1859 Oral     SpO2 10/11/21 1859 96 %     Weight  10/11/21 1857 (!) 332 lb 12.8 oz (151 kg)     Height 10/11/21 1857 6\' 3"  (1.905 m)     Head Circumference --      Peak Flow --      Pain Score 10/11/21 1856 9     Pain Loc --      Pain Edu? --      Excl. in GC? --    No data found.  Updated Vital Signs BP 123/79 (BP Location: Left Arm)   Pulse 77   Temp 98.3 F (36.8 C) (Oral)   Resp 12   Ht 6\' 3"  (1.905 m)   Wt (!) 332 lb 12.8 oz (151 kg)   SpO2 96%   BMI 41.60 kg/m   Visual Acuity Right Eye Distance:   Left Eye Distance:   Bilateral Distance:    Right Eye Near:   Left Eye Near:    Bilateral Near:     Physical Exam Vitals and nursing note reviewed.  Constitutional:      Appearance: Normal appearance. He is not ill-appearing or toxic-appearing.     Comments: Very pleasant patient sitting on exam in position of comfort table in no acute distress.   HENT:     Head: Normocephalic and atraumatic.     Right Ear: Hearing and external ear normal.     Left Ear: Hearing and external ear normal.     Nose: Nose normal.     Mouth/Throat:     Lips: Pink.     Mouth: Mucous membranes are moist.  Eyes:     General: Lids are normal. Vision grossly intact. Gaze aligned appropriately.     Extraocular Movements: Extraocular movements intact.     Conjunctiva/sclera: Conjunctivae normal.  Cardiovascular:     Rate and Rhythm: Normal rate and regular rhythm.     Heart sounds: Normal heart sounds, S1 normal and S2 normal.  Pulmonary:     Effort: Pulmonary effort is normal. No respiratory distress.     Breath sounds: Normal breath sounds and air entry.  Abdominal:     General: Bowel sounds are normal.     Palpations: Abdomen is soft.     Tenderness: There is no abdominal tenderness. There is no right CVA tenderness, left CVA tenderness or guarding.  Musculoskeletal:     Cervical back: Normal and neck supple.     Thoracic back: Normal.     Lumbar back: Tenderness and bony tenderness present. No swelling. Decreased range of motion.      Comments: Significant tenderness to palpation of the bilateral paraspinals of the lumbar spine as well as the lumbar  vertebrae.  No obvious ecchymosis, evidence of injury, laceration, or deformity.  Limited range of motion with forward flexion at the hips due to significant pain.  No radiculopathy elicited with palpation of bilateral sciatic joints.  Sensation intact to bilateral lower extremities.  5/5 strength of bilateral upper and lower extremities against resistance.  Skin:    General: Skin is warm and dry.     Capillary Refill: Capillary refill takes less than 2 seconds.     Findings: No rash.  Neurological:     General: No focal deficit present.     Mental Status: He is alert and oriented to person, place, and time. Mental status is at baseline.     Cranial Nerves: No dysarthria or facial asymmetry.     Gait: Gait is intact.  Psychiatric:        Mood and Affect: Mood normal.        Speech: Speech normal.        Behavior: Behavior normal.        Thought Content: Thought content normal.        Judgment: Judgment normal.      UC Treatments / Results  Labs (all labs ordered are listed, but only abnormal results are displayed) Labs Reviewed - No data to display  EKG   Radiology No results found.  Procedures Procedures (including critical care time)  Medications Ordered in UC Medications  ketorolac (TORADOL) 30 MG/ML injection 30 mg (30 mg Intramuscular Given 10/11/21 1954)  acetaminophen (TYLENOL) tablet 975 mg (975 mg Oral Given 10/11/21 1953)    Initial Impression / Assessment and Plan / UC Course  I have reviewed the triage vital signs and the nursing notes.  Pertinent labs & imaging results that were available during my care of the patient were reviewed by me and considered in my medical decision making (see chart for details).   1. Strain of lumbar region Strain of neck muscle  Symptomology and physical exam are consistent with lumbar X-ray performed due to  significant discomfort and decreased range of motion negative for acute bony abnormality. We will manage this conservatively with heat, gentle range of motion exercises, and NSAID/muscle relaxer therapy. Patient may take ibuprofen every 6 hours as needed for inflammation and pain with food. Zanaflex muscle relaxer may be used every 6 hours as needed for muscle spasm. No NSAID for 8 hours due to ketorolac administration in clinic. Patient given tylenol 975 as well in clinic and may use this every 6 hours as needed at home for further pain relief. NSAID must be taken with food to avoid stomach upset and is not to be taken with other NSAID containing medications due to risk for GI bleeding. Advised not to take muscle relaxer and drive, drink alcohol, or go to work as this medication can cause drowsiness. Encouraged heat and gentle range of motion exercises to prevent muscle stiffness. Patient agreeable to plan.   Discussed physical exam and available lab work findings in clinic with patient.  Counseled patient regarding appropriate use of medications and potential side effects for all medications recommended or prescribed today. Discussed red flag signs and symptoms of worsening condition,when to call the PCP office, return to urgent care, and when to seek higher level of care in the emergency department. Patient verbalizes understanding and agreement with plan. All questions answered. Patient discharged in stable condition.  Final Clinical Impressions(s) / UC Diagnoses   Final diagnoses:  Strain of lumbar region, initial encounter  Discharge Instructions      X-ray is negative for bony abnormality and fracture. Your pain is most likely muscle strain which will improve on its own with time.   Take ibuprofen with food every 6 hours as needed with food for pain. Do not take any other NSAID containing medicine when taking ibuprofen like naproxen , Advil, Aleve, motrin, aspirin, Voltaren, diclofenac,  and other medicines. Take this medication with a snack to avoid stomach upset.   Your first dose of ibuprofen when you get home may be tomorrow morning since you were given ketorolac injection for pain in the clinic today.  You may also take Zanaflex muscle relaxer every 6 hours.  Do not take this medication and drive or drink alcohol as it can make you sleepy.  Mainly use this medicine at nighttime as needed.  Apply heat and perform gentle range of motion exercises to the area of greatest pain to prevent muscle stiffness and provide further pain relief.   Follow-up with your primary care provider or return to urgent care if your symptoms do not improve in the next 3 to 4 days with medications and interventions recommended today.  If you develop any new or worsening symptoms, please return to urgent care.  If your symptoms are severe, please go to the emergency room.  I hope you feel better!     ED Prescriptions     Medication Sig Dispense Auth. Provider   ibuprofen (ADVIL) 600 MG tablet Take 1 tablet (600 mg total) by mouth every 6 (six) hours as needed. 30 tablet Carlisle Beers, FNP   tiZANidine (ZANAFLEX) 4 MG tablet Take 1 tablet (4 mg total) by mouth every 6 (six) hours as needed for muscle spasms. 30 tablet Carlisle Beers, FNP      PDMP not reviewed this encounter.   Carlisle Beers, [place] 10/14/21 2157

## 2021-10-11 NOTE — ED Triage Notes (Signed)
Pt injured back while lifting a motor last night causing pain and discomfort.

## 2021-10-11 NOTE — Discharge Instructions (Addendum)
X-ray is negative for bony abnormality and fracture. Your pain is most likely muscle strain which will improve on its own with time.   Take ibuprofen with food every 6 hours as needed with food for pain. Do not take any other NSAID containing medicine when taking ibuprofen like naproxen , Advil, Aleve, motrin, aspirin, Voltaren, diclofenac, and other medicines. Take this medication with a snack to avoid stomach upset.   Your first dose of ibuprofen when you get home may be tomorrow morning since you were given ketorolac injection for pain in the clinic today.  You may also take Zanaflex muscle relaxer every 6 hours.  Do not take this medication and drive or drink alcohol as it can make you sleepy.  Mainly use this medicine at nighttime as needed.  Apply heat and perform gentle range of motion exercises to the area of greatest pain to prevent muscle stiffness and provide further pain relief.   Follow-up with your primary care provider or return to urgent care if your symptoms do not improve in the next 3 to 4 days with medications and interventions recommended today.  If you develop any new or worsening symptoms, please return to urgent care.  If your symptoms are severe, please go to the emergency room.  I hope you feel better!
